# Patient Record
Sex: Male | Born: 1945 | Race: White | Hispanic: No | Smoking: Current every day smoker
Health system: Southern US, Community
[De-identification: ages and names within clinical notes are randomized; demographics above are authoritative.]

## PROBLEM LIST (undated history)

## (undated) DIAGNOSIS — K649 Unspecified hemorrhoids: Secondary | ICD-10-CM

## (undated) DIAGNOSIS — E119 Type 2 diabetes mellitus without complications: Secondary | ICD-10-CM

## (undated) DIAGNOSIS — M1990 Unspecified osteoarthritis, unspecified site: Secondary | ICD-10-CM

## (undated) DIAGNOSIS — G7000 Myasthenia gravis without (acute) exacerbation: Secondary | ICD-10-CM

## (undated) DIAGNOSIS — C189 Malignant neoplasm of colon, unspecified: Secondary | ICD-10-CM

## (undated) DIAGNOSIS — E538 Deficiency of other specified B group vitamins: Secondary | ICD-10-CM

## (undated) DIAGNOSIS — I10 Essential (primary) hypertension: Secondary | ICD-10-CM

## (undated) DIAGNOSIS — F419 Anxiety disorder, unspecified: Secondary | ICD-10-CM

## (undated) DIAGNOSIS — K219 Gastro-esophageal reflux disease without esophagitis: Secondary | ICD-10-CM

## (undated) DIAGNOSIS — D649 Anemia, unspecified: Secondary | ICD-10-CM

## (undated) DIAGNOSIS — G8929 Other chronic pain: Secondary | ICD-10-CM

## (undated) DIAGNOSIS — M545 Low back pain: Secondary | ICD-10-CM

## (undated) DIAGNOSIS — K635 Polyp of colon: Secondary | ICD-10-CM

## (undated) DIAGNOSIS — G5603 Carpal tunnel syndrome, bilateral upper limbs: Secondary | ICD-10-CM

## (undated) DIAGNOSIS — K5790 Diverticulosis of intestine, part unspecified, without perforation or abscess without bleeding: Secondary | ICD-10-CM

## (undated) DIAGNOSIS — E785 Hyperlipidemia, unspecified: Secondary | ICD-10-CM

## (undated) DIAGNOSIS — C4491 Basal cell carcinoma of skin, unspecified: Secondary | ICD-10-CM

## (undated) HISTORY — DX: Anemia, unspecified: D64.9

## (undated) HISTORY — PX: BASAL CELL CARCINOMA EXCISION: SHX1214

## (undated) HISTORY — DX: Unspecified osteoarthritis, unspecified site: M19.90

## (undated) HISTORY — PX: COLONOSCOPY: SHX174

## (undated) HISTORY — DX: Diverticulosis of intestine, part unspecified, without perforation or abscess without bleeding: K57.90

## (undated) HISTORY — DX: Low back pain: M54.5

## (undated) HISTORY — DX: Morbid (severe) obesity due to excess calories: E66.01

## (undated) HISTORY — DX: Essential (primary) hypertension: I10

## (undated) HISTORY — DX: Hyperlipidemia, unspecified: E78.5

## (undated) HISTORY — DX: Polyp of colon: K63.5

## (undated) HISTORY — DX: Anxiety disorder, unspecified: F41.9

## (undated) HISTORY — DX: Deficiency of other specified B group vitamins: E53.8

## (undated) HISTORY — DX: Basal cell carcinoma of skin, unspecified: C44.91

## (undated) HISTORY — DX: Myasthenia gravis without (acute) exacerbation: G70.00

## (undated) HISTORY — DX: Other chronic pain: G89.29

## (undated) HISTORY — DX: Malignant neoplasm of colon, unspecified: C18.9

## (undated) HISTORY — DX: Carpal tunnel syndrome, bilateral upper limbs: G56.03

## (undated) HISTORY — DX: Unspecified hemorrhoids: K64.9

## (undated) HISTORY — DX: Type 2 diabetes mellitus without complications: E11.9

---

## 1966-09-10 HISTORY — PX: TONSILLECTOMY: SUR1361

## 1967-09-11 HISTORY — PX: ANKLE ARTHROPLASTY: SUR68

## 2004-09-10 DIAGNOSIS — C189 Malignant neoplasm of colon, unspecified: Secondary | ICD-10-CM

## 2004-09-10 HISTORY — PX: RIGHT COLECTOMY: SHX853

## 2004-09-10 HISTORY — DX: Malignant neoplasm of colon, unspecified: C18.9

## 2004-09-25 ENCOUNTER — Ambulatory Visit: Payer: Self-pay | Admitting: Internal Medicine

## 2004-11-02 ENCOUNTER — Ambulatory Visit: Payer: Self-pay | Admitting: Internal Medicine

## 2004-12-12 ENCOUNTER — Encounter (INDEPENDENT_AMBULATORY_CARE_PROVIDER_SITE_OTHER): Payer: Self-pay | Admitting: Specialist

## 2004-12-12 ENCOUNTER — Inpatient Hospital Stay (HOSPITAL_COMMUNITY): Admission: RE | Admit: 2004-12-12 | Discharge: 2004-12-17 | Payer: Self-pay | Admitting: General Surgery

## 2004-12-12 ENCOUNTER — Encounter (INDEPENDENT_AMBULATORY_CARE_PROVIDER_SITE_OTHER): Payer: Self-pay | Admitting: *Deleted

## 2005-01-01 ENCOUNTER — Encounter (INDEPENDENT_AMBULATORY_CARE_PROVIDER_SITE_OTHER): Payer: Self-pay | Admitting: *Deleted

## 2005-12-26 ENCOUNTER — Ambulatory Visit: Payer: Self-pay | Admitting: Internal Medicine

## 2006-01-23 ENCOUNTER — Ambulatory Visit: Payer: Self-pay | Admitting: Internal Medicine

## 2006-03-07 ENCOUNTER — Encounter (INDEPENDENT_AMBULATORY_CARE_PROVIDER_SITE_OTHER): Payer: Self-pay | Admitting: *Deleted

## 2006-03-07 ENCOUNTER — Encounter: Admission: RE | Admit: 2006-03-07 | Discharge: 2006-03-07 | Payer: Self-pay | Admitting: General Surgery

## 2006-03-22 ENCOUNTER — Encounter: Admission: RE | Admit: 2006-03-22 | Discharge: 2006-03-22 | Payer: Self-pay | Admitting: General Surgery

## 2007-06-23 ENCOUNTER — Encounter (INDEPENDENT_AMBULATORY_CARE_PROVIDER_SITE_OTHER): Payer: Self-pay | Admitting: *Deleted

## 2007-06-23 ENCOUNTER — Encounter: Admission: RE | Admit: 2007-06-23 | Discharge: 2007-06-23 | Payer: Self-pay | Admitting: General Surgery

## 2007-10-18 ENCOUNTER — Encounter: Admission: RE | Admit: 2007-10-18 | Discharge: 2007-10-18 | Payer: Self-pay | Admitting: Internal Medicine

## 2009-02-21 DIAGNOSIS — IMO0002 Reserved for concepts with insufficient information to code with codable children: Secondary | ICD-10-CM | POA: Insufficient documentation

## 2009-02-21 DIAGNOSIS — K5730 Diverticulosis of large intestine without perforation or abscess without bleeding: Secondary | ICD-10-CM | POA: Insufficient documentation

## 2009-02-21 DIAGNOSIS — K649 Unspecified hemorrhoids: Secondary | ICD-10-CM | POA: Insufficient documentation

## 2009-02-21 DIAGNOSIS — E119 Type 2 diabetes mellitus without complications: Secondary | ICD-10-CM | POA: Insufficient documentation

## 2009-02-21 DIAGNOSIS — C189 Malignant neoplasm of colon, unspecified: Secondary | ICD-10-CM | POA: Insufficient documentation

## 2009-02-21 DIAGNOSIS — E785 Hyperlipidemia, unspecified: Secondary | ICD-10-CM | POA: Insufficient documentation

## 2009-02-22 ENCOUNTER — Ambulatory Visit: Payer: Self-pay | Admitting: Internal Medicine

## 2009-02-22 DIAGNOSIS — K648 Other hemorrhoids: Secondary | ICD-10-CM | POA: Insufficient documentation

## 2009-02-22 DIAGNOSIS — Z85038 Personal history of other malignant neoplasm of large intestine: Secondary | ICD-10-CM | POA: Insufficient documentation

## 2009-02-22 DIAGNOSIS — K625 Hemorrhage of anus and rectum: Secondary | ICD-10-CM | POA: Insufficient documentation

## 2009-04-18 ENCOUNTER — Ambulatory Visit: Payer: Self-pay | Admitting: Internal Medicine

## 2009-04-18 ENCOUNTER — Encounter: Payer: Self-pay | Admitting: Internal Medicine

## 2009-04-19 ENCOUNTER — Encounter: Payer: Self-pay | Admitting: Internal Medicine

## 2009-05-27 ENCOUNTER — Telehealth (INDEPENDENT_AMBULATORY_CARE_PROVIDER_SITE_OTHER): Payer: Self-pay | Admitting: *Deleted

## 2009-05-30 ENCOUNTER — Encounter: Payer: Self-pay | Admitting: Internal Medicine

## 2009-08-15 ENCOUNTER — Emergency Department (HOSPITAL_COMMUNITY): Admission: EM | Admit: 2009-08-15 | Discharge: 2009-08-15 | Payer: Self-pay | Admitting: Emergency Medicine

## 2009-08-19 ENCOUNTER — Inpatient Hospital Stay (HOSPITAL_COMMUNITY): Admission: AD | Admit: 2009-08-19 | Discharge: 2009-08-21 | Payer: Self-pay | Admitting: Orthopedic Surgery

## 2009-09-10 HISTORY — PX: PATELLA FRACTURE SURGERY: SHX735

## 2009-12-08 ENCOUNTER — Encounter: Payer: Self-pay | Admitting: Internal Medicine

## 2010-10-10 NOTE — Letter (Signed)
Summary: [facility]   Imported By: Sherian Rein 12/27/2009 10:20:24  _____________________________________________________________________  External Attachment:    Type:   Image     Comment:   External Document

## 2010-12-12 LAB — CBC
HCT: 32.6 % — ABNORMAL LOW (ref 39.0–52.0)
Hemoglobin: 11.1 g/dL — ABNORMAL LOW (ref 13.0–17.0)
Hemoglobin: 12.8 g/dL — ABNORMAL LOW (ref 13.0–17.0)
MCHC: 33.4 g/dL (ref 30.0–36.0)
MCHC: 34.1 g/dL (ref 30.0–36.0)
MCV: 91.8 fL (ref 78.0–100.0)
RBC: 3.6 MIL/uL — ABNORMAL LOW (ref 4.22–5.81)
RDW: 12.4 % (ref 11.5–15.5)
RDW: 12.7 % (ref 11.5–15.5)

## 2010-12-12 LAB — BASIC METABOLIC PANEL
CO2: 27 mEq/L (ref 19–32)
CO2: 27 mEq/L (ref 19–32)
Calcium: 8.4 mg/dL (ref 8.4–10.5)
GFR calc Af Amer: >60 mL/min (ref >60)
GFR calc non Af Amer: >60 mL/min (ref >60)
GFR calc non Af Amer: >60 mL/min (ref >60)
Glucose, Bld: 174 mg/dL — ABNORMAL HIGH (ref 70–99)
Glucose, Bld: 236 mg/dL — ABNORMAL HIGH (ref 70–99)
Potassium: 4.4 mEq/L (ref 3.5–5.1)
Potassium: 4.6 mEq/L (ref 3.5–5.1)
Sodium: 135 mEq/L (ref 135–145)
Sodium: 137 mEq/L (ref 135–145)

## 2010-12-12 LAB — URINALYSIS, ROUTINE W REFLEX MICROSCOPIC
Glucose, UA: NEGATIVE mg/dL
Hgb urine dipstick: NEGATIVE
Ketones, ur: NEGATIVE mg/dL
pH: 5.5 (ref 5.0–8.0)

## 2010-12-12 LAB — DIFFERENTIAL
Basophils Absolute: 0 10*3/uL (ref 0.0–0.1)
Eosinophils Relative: 1 % (ref 0–5)
Lymphocytes Relative: 21 % (ref 12–46)
Lymphs Abs: 2 10*3/uL (ref 0.7–4.0)
Monocytes Absolute: 0.6 10*3/uL (ref 0.1–1.0)
Neutro Abs: 7 10*3/uL (ref 1.7–7.7)

## 2010-12-12 LAB — GLUCOSE, CAPILLARY
Glucose-Capillary: 139 mg/dL — ABNORMAL HIGH (ref 70–99)
Glucose-Capillary: 157 mg/dL — ABNORMAL HIGH (ref 70–99)
Glucose-Capillary: 163 mg/dL — ABNORMAL HIGH (ref 70–99)
Glucose-Capillary: 164 mg/dL — ABNORMAL HIGH (ref 70–99)
Glucose-Capillary: 193 mg/dL — ABNORMAL HIGH (ref 70–99)
Glucose-Capillary: 79 mg/dL (ref 70–99)

## 2010-12-12 LAB — TYPE AND SCREEN: Antibody Screen: NEGATIVE

## 2010-12-12 LAB — PROTIME-INR: INR: 1.05 (ref 0.00–1.49)

## 2010-12-16 LAB — GLUCOSE, CAPILLARY: Glucose-Capillary: 147 mg/dL — ABNORMAL HIGH (ref 70–99)

## 2011-01-26 NOTE — Op Note (Signed)
NAME:  Joshua [name], Joshua [name]                 ACCOUNT NO.:  [id number]   MEDICAL RECORD NO.:  [id number]          PATIENT TYPE:  INP   LOCATION:  0011                         FACILITY:  [facility]   PHYSICIAN:  Adolph Pollack, M.D.DATE OF BIRTH:  1946-02-15   DATE OF PROCEDURE:  12/12/2004  DATE OF DISCHARGE:                                 OPERATIVE REPORT   PREOPERATIVE DIAGNOSIS:  Right colon cancer.   POSTOPERATIVE DIAGNOSIS:  Right colon cancer.   PROCEDURE:  Laparoscopic-assisted right colectomy.   SURGEON:  Adolph Pollack, M.D.   ASSISTANT:  Lorne Skeens. Hoxworth, M.D.   ANESTHESIA:  General.   ESTIMATED BLOOD LOSS:  About 600 cc.   INDICATION:  Mr. Hornback is a 65 year old male who underwent a screening  colonoscopy and was found to have a sessile polypoid lesion in the cecal  area that was biopsied and positive for adenocarcinoma.  CT scan is negative  for metastatic disease.  CEA is within normal limits.  He now presents for  the above procedure.   TECHNIQUE:  He was placed supine on the operating table and general  anesthetic was administered.  A Foley catheter was placed.  The hair on the  abdomen was shaved and the area was sterilely prepped and draped.  He is  morbidly obese, has a BMI of 42.  A small left upper quadrant incision was  made.  Using a Visiport with a 5-mm camera, I was able to gain access into  the peritoneal cavity and after doing so insufflated CO2 gas into the  peritoneal cavity.  I examined the area under the trocar and no trauma to  viscera was noted.  Following this, a 10-mm trocar was placed in the left  mid abdomen.  Another 5-mm trocar was placed in the left mid abdomen.  A 5-  mm trocar was placed in the lower midline just inferior to the umbilicus.  Left upper quadrant 5-mm trocar was then traded out for a 10/11.  I began by  mobilizing the right colon.  I had to use all long trocars.  Because of his  size, we were able to expose but it was  quite a bit slower.  The mesentery  was fairly fatty.  I incised the white line of Toldt on the right side  mobilizing the right colon staying anterior to the plane of the ureter.  I  did this using the harmonic scalpel.  The harmonic scalpel was then used to  free up the hepatic flexure.  Using careful blunt dissection and harmonic  scalpel and staying above the plane of the ureter, I was able to medialize  the right colon in the proximal portion of the transverse colon.  I  subsequently removed the subumbilical trocar and made a larger incision for  an extraction site through all layers.  A wound protection device was placed  and I placed my hand in and did some more hand-assisted dissection by way of  laparoscopy and using the harmonic scalpel.  I was then able to grasp the  cecum and extract the  cecum and distal ileum.  I divided the distal ileum  with the endo GIA stapler.  I subsequently identified the proximal  transverse colon and divided it with the GIA stapler.  The mesentery was  fairly fatty and the wound was very deep.  We then divided the mesenteric  vessels between clamps and ligated them, both with suture and with tie at  times.  This was the difficult portion of the case as his wound was so deep  because of his size and this took approximately twice as long as normal.  Once this had been done, I was then able to perform a side-to-side  anastomosis between the distal ileum and the transverse colon in a stapled  fashion.  The remaining enterotomy was closed with linear non-cutting  stapler and this area was oversewn with interrupted 2-0 silk sutures.  The  distal portion of the anastomosis was reinforced with single 3-0 silk  suture.  Anastomosis was patent, viable, and under no tension.  It dropped  back into the abdomen.  I then opened the specimen on the back table and  noted it contained a lesion and I marked the lesion with a suture.  I  changed gown and gloves at this  time.   I then irrigated out the abdominal cavity and evacuated the fluid with  suction.  About 2500 cc was used for irrigation.  About 600 cc blood loss  was noted.  Needle, sponge, and instrument counts were correct.  I then  closed the fascia of the extraction site in the lower midline with a running  #1 PDS suture.  The subcutaneous tissue was irrigated and the skin closed  with staples.  I re-insufflated the abdomen and noted irrigation fluid  around the peri-hepatic area and evacuated this and irrigated more.  Fluid  started coming back clear.  I noted no active bleeding and the fascial  closure was solid.  I then removed the remaining trocars and released the  pneumoperitoneum.  The skin incisions of the trocars were then closed with  staples.  Sterile dressings were applied.   He tolerated the procedure well without any apparent complications.  The  time of the operation was approximately 1 hour longer because of his size.  He subsequently was taken to the recovery room in satisfactory condition.      TJR/MEDQ  D:  12/12/2004  T:  12/12/2004  Job:  [id number]   cc:   Wilhemina Bonito. Marina Goodell, M.D. [facility]   Barry Dienes. Eloise Harman, M.D.  [address]  [place]  [place] [address]  Fax: [phone]

## 2011-01-26 NOTE — Discharge Summary (Signed)
NAMEGiovan Pinsky, Bell                 ACCOUNT NO.:  [id number]   MEDICAL RECORD NO.:  [id number]          PATIENT TYPE:  INP   LOCATION:  0444                         FACILITY:  [facility]   PHYSICIAN:  Adolph Pollack, M.D.DATE OF BIRTH:  1946-07-23   DATE OF ADMISSION:  12/12/2004  DATE OF DISCHARGE:  12/17/2004                                 DISCHARGE SUMMARY   PRIMARY DISCHARGE DIAGNOSIS:  Stage 1 adenocarcinoma of the right colon.   SECONDARY DIAGNOSES:  1.  Type 2 diabetes mellitus.  2.  Hypertension.  3.  Mild postoperative ileus.   PROCEDURE:  Laparoscopic-assisted right colectomy.   REASON FOR ADMISSION:  Mr. Rafferty is a 64 year old male who had a screening  colonoscopy and had a sessile polyp in the cecum that was biopsied and  positive for adenocarcinoma. CT scan was negative for metastatic disease. He  was admitted for elective partial colectomy.   HOSPITAL COURSE:  He underwent the above procedure and postoperatively was  on the Glucommander. Postoperative day #1 there was noted to be a  significant drop in the hemoglobin from 13 to 8.7 and his creatinine was up  to 2.1. There was concern about potential ureteral injury. A STAT renal  ultrasound was performed which was normal. I went ahead and repeated the  laboratory values and hemoglobin was only 10.5 and the creatinine was 1.0  This was consistent with laboratory error on the first test. I discussed  this with the patient and also discussed with administration. After that he  had a relatively unremarkable course. He was taken off the Glucommander,  switched to Humalog, diet was started. Pathology came back consistent with  stage 1 colon cancer. Blood pressure remained stable. He was passing gas by  postoperative day #4 and by postoperative day #5 was having bowel movements,  tolerating a diet, wound was clean and intact, and he was discharged.   DISPOSITION:  Discharged to home in satisfactory condition on December 17, 2004.  He will return in 4 days for staple removal. He was given specific activity  instructions and dietary instructions. Tylox for pain was given. He was told  to resume home medicines.      TJR/MEDQ  D:  01/01/2005  T:  01/01/2005  Job:  [id number]   cc:   Barry Dienes. Eloise Harman, M.D.  [address]  [place]  [place] [address]  Fax: [phone]   Wilhemina Bonito. Marina Goodell, M.D. [facility]

## 2011-04-17 ENCOUNTER — Other Ambulatory Visit: Payer: Self-pay | Admitting: Internal Medicine

## 2011-04-20 ENCOUNTER — Ambulatory Visit
Admission: RE | Admit: 2011-04-20 | Discharge: 2011-04-20 | Disposition: A | Discharge status: Home / Self Care | Payer: 59 | Source: Ambulatory Visit | Attending: Internal Medicine | Admitting: Internal Medicine

## 2011-05-16 ENCOUNTER — Encounter: Payer: Self-pay | Admitting: Internal Medicine

## 2011-05-17 ENCOUNTER — Other Ambulatory Visit: Payer: Self-pay | Admitting: Neurology

## 2011-05-17 DIAGNOSIS — E119 Type 2 diabetes mellitus without complications: Secondary | ICD-10-CM

## 2011-05-17 DIAGNOSIS — G7001 Myasthenia gravis with (acute) exacerbation: Secondary | ICD-10-CM

## 2011-05-19 ENCOUNTER — Inpatient Hospital Stay (HOSPITAL_COMMUNITY)
Admission: EM | Admit: 2011-05-19 | Discharge: 2011-06-01 | DRG: 056 | Disposition: A | Discharge status: Home Health | Payer: 59 | Attending: Internal Medicine | Admitting: Internal Medicine

## 2011-05-19 ENCOUNTER — Emergency Department (HOSPITAL_COMMUNITY): Payer: 59

## 2011-05-19 DIAGNOSIS — I10 Essential (primary) hypertension: Secondary | ICD-10-CM | POA: Diagnosis present

## 2011-05-19 DIAGNOSIS — J9600 Acute respiratory failure, unspecified whether with hypoxia or hypercapnia: Secondary | ICD-10-CM | POA: Diagnosis present

## 2011-05-19 DIAGNOSIS — F17200 Nicotine dependence, unspecified, uncomplicated: Secondary | ICD-10-CM | POA: Diagnosis present

## 2011-05-19 DIAGNOSIS — E119 Type 2 diabetes mellitus without complications: Secondary | ICD-10-CM | POA: Diagnosis present

## 2011-05-19 DIAGNOSIS — E785 Hyperlipidemia, unspecified: Secondary | ICD-10-CM | POA: Diagnosis present

## 2011-05-19 DIAGNOSIS — Z794 Long term (current) use of insulin: Secondary | ICD-10-CM

## 2011-05-19 DIAGNOSIS — E46 Unspecified protein-calorie malnutrition: Secondary | ICD-10-CM | POA: Diagnosis not present

## 2011-05-19 DIAGNOSIS — E876 Hypokalemia: Secondary | ICD-10-CM | POA: Diagnosis not present

## 2011-05-19 DIAGNOSIS — R1310 Dysphagia, unspecified: Secondary | ICD-10-CM | POA: Diagnosis present

## 2011-05-19 DIAGNOSIS — R197 Diarrhea, unspecified: Secondary | ICD-10-CM | POA: Diagnosis present

## 2011-05-19 DIAGNOSIS — IMO0002 Reserved for concepts with insufficient information to code with codable children: Secondary | ICD-10-CM

## 2011-05-19 DIAGNOSIS — G7001 Myasthenia gravis with (acute) exacerbation: Principal | ICD-10-CM | POA: Diagnosis present

## 2011-05-19 LAB — CBC
MCH: 29.8 pg (ref 26.0–34.0)
MCHC: 34.2 g/dL (ref 30.0–36.0)
MCV: 87.2 fL (ref 78.0–100.0)
Platelets: 298 10*3/uL (ref 150–400)
RBC: 4.76 MIL/uL (ref 4.22–5.81)
RDW: 12.5 % (ref 11.5–15.5)

## 2011-05-19 LAB — COMPREHENSIVE METABOLIC PANEL
Albumin: 3.8 g/dL (ref 3.5–5.2)
Alkaline Phosphatase: 87 U/L (ref 39–117)
BUN: 26 mg/dL — ABNORMAL HIGH (ref 6–23)
Creatinine, Ser: 1.17 mg/dL (ref 0.50–1.35)
GFR calc Af Amer: >60 mL/min (ref >60)
Glucose, Bld: 153 mg/dL — ABNORMAL HIGH (ref 70–99)
Potassium: 4.1 mEq/L (ref 3.5–5.1)
Total Protein: 6.8 g/dL (ref 6.0–8.3)

## 2011-05-19 LAB — DIFFERENTIAL
Basophils Relative: 0 % (ref 0–1)
Eosinophils Absolute: 0 10*3/uL (ref 0.0–0.7)
Eosinophils Relative: 0 % (ref 0–5)
Lymphs Abs: 2.1 10*3/uL (ref 0.7–4.0)
Monocytes Absolute: 0.9 10*3/uL (ref 0.1–1.0)
Monocytes Relative: 9 % (ref 3–12)
Neutrophils Relative %: 71 % (ref 43–77)

## 2011-05-19 LAB — URINALYSIS, ROUTINE W REFLEX MICROSCOPIC
Ketones, ur: 40 mg/dL — AB
Leukocytes, UA: NEGATIVE
Nitrite: NEGATIVE
Protein, ur: NEGATIVE mg/dL
pH: 5.5 (ref 5.0–8.0)

## 2011-05-19 LAB — PROTIME-INR: Prothrombin Time: 13.3 seconds (ref 11.6–15.2)

## 2011-05-20 ENCOUNTER — Emergency Department (HOSPITAL_COMMUNITY): Payer: 59

## 2011-05-20 ENCOUNTER — Inpatient Hospital Stay (HOSPITAL_COMMUNITY): Payer: 59

## 2011-05-20 LAB — CBC
Hemoglobin: 12.9 g/dL — ABNORMAL LOW (ref 13.0–17.0)
MCHC: 33.3 g/dL (ref 30.0–36.0)
RDW: 12.6 % (ref 11.5–15.5)
WBC: 9.1 10*3/uL (ref 4.0–10.5)

## 2011-05-20 LAB — BASIC METABOLIC PANEL
Chloride: 103 mEq/L (ref 96–112)
GFR calc Af Amer: >60 mL/min (ref >60)
GFR calc non Af Amer: >60 mL/min (ref >60)
Potassium: 4.4 mEq/L (ref 3.5–5.1)
Sodium: 137 mEq/L (ref 135–145)

## 2011-05-20 LAB — MRSA PCR SCREENING: MRSA by PCR: NEGATIVE

## 2011-05-20 LAB — GLUCOSE, CAPILLARY
Glucose-Capillary: 186 mg/dL — ABNORMAL HIGH (ref 70–99)
Glucose-Capillary: 153 mg/dL — ABNORMAL HIGH (ref 70–99)
Glucose-Capillary: 106 mg/dL — ABNORMAL HIGH (ref 70–99)

## 2011-05-20 LAB — HEMOGLOBIN A1C
Hgb A1c MFr Bld: 7.5 % — ABNORMAL HIGH (ref <5.7)
Mean Plasma Glucose: 169 mg/dL — ABNORMAL HIGH (ref <117)

## 2011-05-20 MED ORDER — IOHEXOL 300 MG/ML  SOLN
100.0000 mL | Freq: Once | INTRAMUSCULAR | Status: AC | PRN
  Administered 2011-05-20: 100 mL via INTRAVENOUS

## 2011-05-20 NOTE — Consult Note (Addendum)
[name], [name] NO.:  [id number]  MEDICAL RECORD NO.:  [id number]  LOCATION:  1240                         FACILITY:  [facility]  PHYSICIAN:  Beryle Beams, MD   DATE OF BIRTH:  Jun 08, 1946  DATE OF CONSULTATION:  05/20/2011 DATE OF DISCHARGE:                                CONSULTATION   HISTORY:  Joshua [name] is a 65 year old white male with recently diagnosed myasthenia gravis (by Dr. Sandria Manly of Neurology) who is undergoing evaluation for difficulties with dysphagia and neck weakness.  History is according to the patient and his wife who are historians as well as reviewed the hospital chart and conversation with Dr. Manus Gunning.  Joshua [name] indicates that he has had difficulties with some dysarthria and dysphasia for the last approximately 3 months and had been recently diagnosed with myasthenia gravis by Dr. Sandria Manly of Neurology.  It sounds as if he may have had blood work (? acetylcholine receptor antibody level) as well as some type of electrodiagnostic testing to arrive at the diagnosis.  He had been apparently started on some Mestinon a few weeks ago, but then apparently stopped taking this last night as well as today due to difficulties with diarrhea.  He comes in today after a progressive decline with difficulty swallowing, neck weakness, and dysarthria.  This apparently has been a progressive problem for the last few weeks, but became much worse today.  In the emergency room, he has had further testing including chest x-ray which showed some mild enlargement of his heart, but no effusions and no evidence of any type of active disease.  He also had some lab work performed showing white count of 10.6 and unremarkable chemistry-7 with exception glucose 153. Neurology is not consulted for further evaluation.  PAST MEDICAL HISTORY:  Significant for, 1. History of chronic back pain. 2. Colon cancer. 3. Diabetes. 4. High cholesterol. 5. Hypertension. 6.  myasthenia gravis as noted above.  PAST SURGICAL HISTORY:  None noted.  MEDICATIONS:  Include Lipitor, Azor, hydrochlorothiazide, metformin, Ultram, hyoscyamine, as well as Humalog.  He also has been started on pyridostigmine bromide 30 mg which he states he takes three times a day half an hour before meals.  ALLERGIES:  No known drug allergies.  SOCIAL HISTORY:  He resides in the [place] area.  I do not believe that he smokes or uses alcohol.  FAMILY HISTORY:  Unknown.  REVIEW OF SYSTEMS:  NEUROLOGICAL SYSTEMS:  As noted above.  PHYSICAL EXAMINATION:  GENERAL:  Reveals a pleasant, cooperative, late middle-aged white male who is in no acute distress. VITAL SIGNS:  He is currently afebrile.  Vital signs are stable.  His blood pressure is 124/72, pulse 99, respiratory rate 20. NEUROLOGIC:  He is currently alert and he is oriented x3.  He has fluent, but significantly dysarthric speech.  His dysarthria appears to be more guttural.  He can name, repeat, and follow commands.  He has bilateral ptosis.  His extraocular see appear to be intact without any obvious gaze restriction.  There is no noticeable diplopia with sustained up gaze.  Facial musculature is weak and he has difficulties puffing his cheeks.  His tongue protrudes  midline without any obvious atrophy or fasciculations.  Otherwise, cranial nerves II through XII appear to be intact.  Motor examination, he has notable neck flexion weakness, a grade 4/5, but good neck extension with the strength of 5/5. He does appear to have fatigable proximal deltoid weakness, but strength is roughly at least 4 to 5 over 5.  Remainder of motor strength in the arms as well as in the legs appears to be 5/5.  Sensory exam is symmetric pinprick and vibration.  Cerebellar exam on finger-to-nose, there is no evidence of pronator drift.  Deep tendon reflexes were 1+ with toes downgoing. CARDIOVASCULAR:  Regular rate and rhythm without murmur or  gallop.  No carotid bruits.  No evidence of cyanosis, clubbing, or edema. ABDOMEN:  Soft and nontender with normoactive bowel sounds. LUNGS:  Clear auscultation bilaterally. SKIN:  Reveals no obvious cuts, abrasions, or bruises.  LABORATORY VALUES:  Include a unremarkable urinalysis.  His chemistry-7 is unremarkable with a normal CBC.  Chest x-ray is unremarkable as noted above.  ASSESSMENT:  Myasthenia gravis (ocular bulbar) with recent worsening.  DISCUSSION:  Joshua [name] presents with a recently diagnosed ocular bulbar myasthenia gravis.  He has had some recent worsening in regard to his dysarthria as well as difficulties with dysphagia.  This appears to have coincided with cessation of his Mestinon due to his difficulties with diarrhea.  On exam at this time, he appears to have mostly bulbar weakness as well as neck flexion weakness, but good extremity weakness and no obvious respiratory distress.  FVC has been checked which is noted to be less than 2 L.  PLAN:  At this time, I would suggest restarting his Mestinon 60 mg one- half tablet t.i.d. and I agree with using Lomotil for his diarrhea. Obviously, we will have to begin his medicines via the NG tube.  If the Lomotil is not helpful, I will consider try Robinul for the diarrhea. He will be started on IVIG (0.4 g/kg per day x5 days) to help with his acute worsening.  If this is not helpful, then we can consider IV Solu- Medrol with transition to prednisone, but he would need to be monitored closely as this can typically cause transient worsening after a few days.  Chest CT will be ordered to evaluate for thymoma and also he will need to be seen by speech, physical, and occupational therapy.  I will continue frequent neuro checks with an FVC as well as NIF on a daily basis.  Finally, he will need a sleep study as an outpatient as he is obese with increased neck size, daytime sleepiness, and snoring and is even more prone to sleep  apnea due to his myasthenia.  Going forward in general, we should try to avoid the use of any type of anticholinergics, certain cardiovascular, and antibiotic medications which may make his myasthenia worse.  None of his admission medications were necessarily in this category.         ______________________________ Beryle Beams, MD    RY/MEDQ  D:  05/20/2011  T:  05/20/2011  Job:  [id number]  Electronically Signed by Beryle Beams MD on 06/30/2011 08:57:48 PM

## 2011-05-21 ENCOUNTER — Other Ambulatory Visit: Payer: 59

## 2011-05-21 LAB — CBC
HCT: 34.4 % — ABNORMAL LOW (ref 39.0–52.0)
MCHC: 32.6 g/dL (ref 30.0–36.0)
MCV: 90.3 fL (ref 78.0–100.0)
Platelets: 244 10*3/uL (ref 150–400)
RDW: 12.8 % (ref 11.5–15.5)

## 2011-05-21 LAB — GLUCOSE, CAPILLARY
Glucose-Capillary: 111 mg/dL — ABNORMAL HIGH (ref 70–99)
Glucose-Capillary: 141 mg/dL — ABNORMAL HIGH (ref 70–99)
Glucose-Capillary: 85 mg/dL (ref 70–99)

## 2011-05-21 LAB — DIFFERENTIAL
Basophils Absolute: 0 10*3/uL (ref 0.0–0.1)
Eosinophils Absolute: 0 10*3/uL (ref 0.0–0.7)
Eosinophils Relative: 0 % (ref 0–5)
Lymphocytes Relative: 15 % (ref 12–46)
Lymphs Abs: 1.5 10*3/uL (ref 0.7–4.0)
Monocytes Absolute: 1.1 10*3/uL — ABNORMAL HIGH (ref 0.1–1.0)

## 2011-05-21 LAB — MAGNESIUM: Magnesium: 1.6 mg/dL (ref 1.5–2.5)

## 2011-05-21 LAB — BASIC METABOLIC PANEL
BUN: 16 mg/dL (ref 6–23)
Calcium: 8.5 mg/dL (ref 8.4–10.5)
Creatinine, Ser: 0.99 mg/dL (ref 0.50–1.35)
GFR calc Af Amer: >60 mL/min (ref >60)
GFR calc non Af Amer: >60 mL/min (ref >60)

## 2011-05-22 ENCOUNTER — Inpatient Hospital Stay (HOSPITAL_COMMUNITY): Payer: 59

## 2011-05-22 ENCOUNTER — Encounter (HOSPITAL_COMMUNITY): Payer: Self-pay

## 2011-05-22 ENCOUNTER — Other Ambulatory Visit: Payer: 59

## 2011-05-22 DIAGNOSIS — E119 Type 2 diabetes mellitus without complications: Secondary | ICD-10-CM

## 2011-05-22 DIAGNOSIS — I10 Essential (primary) hypertension: Secondary | ICD-10-CM

## 2011-05-22 DIAGNOSIS — G7001 Myasthenia gravis with (acute) exacerbation: Secondary | ICD-10-CM

## 2011-05-22 DIAGNOSIS — J9600 Acute respiratory failure, unspecified whether with hypoxia or hypercapnia: Secondary | ICD-10-CM

## 2011-05-22 LAB — BASIC METABOLIC PANEL
CO2: 25 mEq/L (ref 19–32)
Chloride: 107 mEq/L (ref 96–112)
GFR calc non Af Amer: >60 mL/min (ref >60)
Glucose, Bld: 170 mg/dL — ABNORMAL HIGH (ref 70–99)
Potassium: 3.9 mEq/L (ref 3.5–5.1)
Sodium: 138 mEq/L (ref 135–145)

## 2011-05-22 LAB — GLUCOSE, CAPILLARY
Glucose-Capillary: 152 mg/dL — ABNORMAL HIGH (ref 70–99)
Glucose-Capillary: 175 mg/dL — ABNORMAL HIGH (ref 70–99)

## 2011-05-22 LAB — BLOOD GAS, ARTERIAL
Acid-base deficit: 6.3 mmol/L — ABNORMAL HIGH (ref 0.0–2.0)
Drawn by: 336861
FIO2: 0.5 %
O2 Saturation: 97.1 %
Patient temperature: 98.6
RATE: 16 resp/min
pO2, Arterial: 98 mmHg (ref 80.0–100.0)

## 2011-05-22 LAB — CBC
HCT: 32.8 % — ABNORMAL LOW (ref 39.0–52.0)
Hemoglobin: 10.9 g/dL — ABNORMAL LOW (ref 13.0–17.0)
MCHC: 33.2 g/dL (ref 30.0–36.0)
RBC: 3.64 MIL/uL — ABNORMAL LOW (ref 4.22–5.81)
WBC: 9.6 10*3/uL (ref 4.0–10.5)

## 2011-05-23 ENCOUNTER — Inpatient Hospital Stay (HOSPITAL_COMMUNITY): Payer: 59

## 2011-05-23 LAB — BLOOD GAS, ARTERIAL
Bicarbonate: 23.6 mEq/L (ref 20.0–24.0)
Drawn by: 232811
O2 Saturation: 98 %
PEEP: 5 cmH2O
Patient temperature: 98.6
RATE: 18 resp/min

## 2011-05-23 LAB — CBC
MCH: 29.4 pg (ref 26.0–34.0)
MCHC: 32.8 g/dL (ref 30.0–36.0)
Platelets: 242 10*3/uL (ref 150–400)

## 2011-05-23 LAB — URINE CULTURE
Colony Count: NO GROWTH
Culture: NO GROWTH
Special Requests: NEGATIVE

## 2011-05-23 LAB — GLUCOSE, CAPILLARY
Glucose-Capillary: 152 mg/dL — ABNORMAL HIGH (ref 70–99)
Glucose-Capillary: 216 mg/dL — ABNORMAL HIGH (ref 70–99)
Glucose-Capillary: 210 mg/dL — ABNORMAL HIGH (ref 70–99)

## 2011-05-23 LAB — BASIC METABOLIC PANEL
Calcium: 8.4 mg/dL (ref 8.4–10.5)
GFR calc Af Amer: >60 mL/min (ref >60)
GFR calc non Af Amer: >60 mL/min (ref >60)
Sodium: 137 mEq/L (ref 135–145)

## 2011-05-24 ENCOUNTER — Inpatient Hospital Stay (HOSPITAL_COMMUNITY): Payer: 59

## 2011-05-24 DIAGNOSIS — R0902 Hypoxemia: Secondary | ICD-10-CM

## 2011-05-24 LAB — BASIC METABOLIC PANEL
BUN: 25 mg/dL — ABNORMAL HIGH (ref 6–23)
CO2: 25 mEq/L (ref 19–32)
CO2: 24 mEq/L (ref 19–32)
Calcium: 8.2 mg/dL — ABNORMAL LOW (ref 8.4–10.5)
Calcium: 8.7 mg/dL (ref 8.4–10.5)
Calcium: 8.8 mg/dL (ref 8.4–10.5)
Chloride: 106 mEq/L (ref 96–112)
Creatinine, Ser: 0.91 mg/dL (ref 0.50–1.35)
Creatinine, Ser: 0.87 mg/dL (ref 0.50–1.35)
GFR calc non Af Amer: >60 mL/min (ref >60)
GFR calc non Af Amer: >60 mL/min (ref >60)
Glucose, Bld: 193 mg/dL — ABNORMAL HIGH (ref 70–99)
Glucose, Bld: 206 mg/dL — ABNORMAL HIGH (ref 70–99)
Sodium: 142 mEq/L (ref 135–145)

## 2011-05-24 LAB — CBC
Hemoglobin: 10.2 g/dL — ABNORMAL LOW (ref 13.0–17.0)
MCH: 29.3 pg (ref 26.0–34.0)
MCV: 89.1 fL (ref 78.0–100.0)
RBC: 3.48 MIL/uL — ABNORMAL LOW (ref 4.22–5.81)

## 2011-05-24 LAB — BLOOD GAS, ARTERIAL
Acid-Base Excess: 2.7 mmol/L — ABNORMAL HIGH (ref 0.0–2.0)
Bicarbonate: 25.6 mEq/L — ABNORMAL HIGH (ref 20.0–24.0)
Bicarbonate: 25.6 mEq/L — ABNORMAL HIGH (ref 20.0–24.0)
FIO2: 0.3 %
O2 Saturation: 97 %
Patient temperature: 98.1
RATE: 18 resp/min
TCO2: 22.9 mmol/L (ref 0–100)
TCO2: 23.7 mmol/L (ref 0–100)
pH, Arterial: 7.31 — ABNORMAL LOW (ref 7.350–7.450)
pO2, Arterial: 79.8 mmHg — ABNORMAL LOW (ref 80.0–100.0)

## 2011-05-24 LAB — PHOSPHORUS: Phosphorus: 2.7 mg/dL (ref 2.3–4.6)

## 2011-05-24 LAB — GLUCOSE, CAPILLARY
Glucose-Capillary: 201 mg/dL — ABNORMAL HIGH (ref 70–99)
Glucose-Capillary: 186 mg/dL — ABNORMAL HIGH (ref 70–99)
Glucose-Capillary: 217 mg/dL — ABNORMAL HIGH (ref 70–99)

## 2011-05-25 ENCOUNTER — Inpatient Hospital Stay (HOSPITAL_COMMUNITY): Payer: 59

## 2011-05-25 LAB — GLUCOSE, CAPILLARY
Glucose-Capillary: 190 mg/dL — ABNORMAL HIGH (ref 70–99)
Glucose-Capillary: 204 mg/dL — ABNORMAL HIGH (ref 70–99)

## 2011-05-25 LAB — BASIC METABOLIC PANEL
BUN: 31 mg/dL — ABNORMAL HIGH (ref 6–23)
Calcium: 8.4 mg/dL (ref 8.4–10.5)
Creatinine, Ser: 0.87 mg/dL (ref 0.50–1.35)
GFR calc Af Amer: >60 mL/min (ref >60)
GFR calc non Af Amer: >60 mL/min (ref >60)
Potassium: 3.5 mEq/L (ref 3.5–5.1)

## 2011-05-25 LAB — CULTURE, BAL-QUANTITATIVE W GRAM STAIN

## 2011-05-26 ENCOUNTER — Inpatient Hospital Stay (HOSPITAL_COMMUNITY): Payer: 59

## 2011-05-26 DIAGNOSIS — G7001 Myasthenia gravis with (acute) exacerbation: Secondary | ICD-10-CM

## 2011-05-26 DIAGNOSIS — J9600 Acute respiratory failure, unspecified whether with hypoxia or hypercapnia: Secondary | ICD-10-CM

## 2011-05-26 DIAGNOSIS — E119 Type 2 diabetes mellitus without complications: Secondary | ICD-10-CM

## 2011-05-26 LAB — GLUCOSE, CAPILLARY
Glucose-Capillary: 205 mg/dL — ABNORMAL HIGH (ref 70–99)
Glucose-Capillary: 195 mg/dL — ABNORMAL HIGH (ref 70–99)
Glucose-Capillary: 271 mg/dL — ABNORMAL HIGH (ref 70–99)

## 2011-05-26 LAB — BASIC METABOLIC PANEL
Chloride: 110 mEq/L (ref 96–112)
Creatinine, Ser: 0.91 mg/dL (ref 0.50–1.35)
GFR calc Af Amer: >60 mL/min (ref >60)
GFR calc non Af Amer: >60 mL/min (ref >60)
Potassium: 3.7 mEq/L (ref 3.5–5.1)

## 2011-05-26 LAB — MAGNESIUM: Magnesium: 2.1 mg/dL (ref 1.5–2.5)

## 2011-05-26 LAB — CBC
HCT: 32.1 % — ABNORMAL LOW (ref 39.0–52.0)
Hemoglobin: 10.2 g/dL — ABNORMAL LOW (ref 13.0–17.0)
MCH: 28.8 pg (ref 26.0–34.0)
MCHC: 31.8 g/dL (ref 30.0–36.0)

## 2011-05-26 LAB — PHOSPHORUS: Phosphorus: 4 mg/dL (ref 2.3–4.6)

## 2011-05-27 DIAGNOSIS — R0902 Hypoxemia: Secondary | ICD-10-CM

## 2011-05-27 DIAGNOSIS — J9600 Acute respiratory failure, unspecified whether with hypoxia or hypercapnia: Secondary | ICD-10-CM

## 2011-05-27 DIAGNOSIS — E119 Type 2 diabetes mellitus without complications: Secondary | ICD-10-CM

## 2011-05-27 DIAGNOSIS — G7001 Myasthenia gravis with (acute) exacerbation: Secondary | ICD-10-CM

## 2011-05-27 LAB — GLUCOSE, CAPILLARY
Glucose-Capillary: 175 mg/dL — ABNORMAL HIGH (ref 70–99)
Glucose-Capillary: 199 mg/dL — ABNORMAL HIGH (ref 70–99)
Glucose-Capillary: 190 mg/dL — ABNORMAL HIGH (ref 70–99)

## 2011-05-27 LAB — BASIC METABOLIC PANEL
CO2: 28 mEq/L (ref 19–32)
Chloride: 112 mEq/L (ref 96–112)
GFR calc non Af Amer: >60 mL/min (ref >60)
Glucose, Bld: 179 mg/dL — ABNORMAL HIGH (ref 70–99)
Potassium: 3.6 mEq/L (ref 3.5–5.1)
Sodium: 146 mEq/L — ABNORMAL HIGH (ref 135–145)

## 2011-05-27 LAB — PHOSPHORUS: Phosphorus: 4.1 mg/dL (ref 2.3–4.6)

## 2011-05-27 NOTE — H&P (Signed)
[name], [name]                 ACCOUNT NO.:  [id number]  MEDICAL RECORD NO.:  [id number]  LOCATION:  WLED                         FACILITY:  [facility]  PHYSICIAN:  Della Goo, M.D. DATE OF BIRTH:  08/10/1946  DATE OF ADMISSION:  05/19/2011 DATE OF DISCHARGE:                             HISTORY & PHYSICAL   DATE OF ADMISSION:  May 19, 2011.  PRIMARY CARE PHYSICIAN:  Dr. Clelia Croft.  NEUROLOGIST:  Genene Churn. Love, M.D.  CHIEF COMPLAINT:  Difficulty swallowing.  HISTORY OF PRESENT ILLNESS:  This is a 65 year old male with a history of myasthenia gravis, who presents to the emergency department with complaints of worsening dysphagia over the day.  He has been unable to swallow his foods, or liquids, or secretions.  He denies having any fevers or chills.  He also has been having bouts of diarrhea for the past few days.  He was not able to take his medications last night or today, which may be the cause of the worsening problem with his swallowing and flare-up of the myasthenia gravis.  He contacted the on- call physician, who recommended that he present to the emergency department for admission and treatment.  Neurology was consulted and advised medication administration.  The patient in the emergency department also underwent an evaluation of his FVC and was found to have a suboptimal tidal volume.  The patient was referred for medical admission to the [facility] hospitalist.  PAST MEDICAL HISTORY: 1. Myasthenia gravis. 2. Type 2 diabetes mellitus. 3. Hypertension. 4. Hyperlipidemia.  PAST SURGICAL HISTORY:  History of an open reduction and internal fixation of a left patellar fracture.  MEDICATIONS:  At this time include: 1. Lipitor. 2. Azor. 3. Hydrochlorothiazide. 4. Metformin. 5. Tramadol. 6. Pyridostigmine bromide. 7. Hyoscyamine sulfate. 8. Humalog insulin 75/25, 45 units subcu b.i.d.  ALLERGIES:  No known drug allergies.  SOCIAL HISTORY:  The patient is  married.  He is a smoker, but smokes only a small amount of cigarettes, 2 cigarettes daily.  He has been cutting down.  He denies any alcohol usage or illicit drug usage.  FAMILY HISTORY:  Noncontributory.  REVIEW OF SYSTEMS:  Pertinents mentioned above.  PHYSICAL EXAMINATION FINDINGS:  GENERAL:  This is a 65 year old morbidly obese Caucasian male, who is in discomfort, but no acute distress currently. VITAL SIGNS:  Temperature 97.7, blood pressure 124/72, heart rate 116, respirations 19, O2 sats 95%. HEENT:  Normocephalic, atraumatic.  Pupils are equally round, but are asymmetrically reactive to light.  There is mild lid lag of both eyes. Funduscopic is benign.  Nares are patent bilaterally.  Oropharynx is clear.  There is no airway compromise at this time. NECK:  Supple, full range of motion.  No thyromegaly, adenopathy, or jugular venous distention. CARDIOVASCULAR:  Regular rate and rhythm.  Normal S1-S2, no murmurs, gallops, or rubs appreciated. LUNGS:  Clear to auscultation bilaterally.  No rales, rhonchi, or wheezes. ABDOMEN:  Positive bowel sounds, soft, nontender, and nondistended.  No hepatosplenomegaly.  Obese. EXTREMITIES: Without cyanosis, clubbing, or edema. NEUROLOGIC:  Generalized weakness, but no focal deficits on examination and of note the patient walks with a cane for assistance.  LABORATORY STUDIES:  White  blood cell count 10.6, hemoglobin 14.2, hematocrit 41.5, MCV 87.2, platelets 296.  Neutrophils 71%, lymphocytes 20%.  Sodium 138, potassium 4.1, chloride 100, carbon dioxide 26, BUN 26, creatinine 1.17, and glucose of 153.  Chest x-ray reveals no acute disease process.  EKG reveals a sinus tachycardia.  No acute ST-segment changes were seen, however.  X-rays of the neck also is  negative for any acute findings or fractures.  ASSESSMENT:  42 four year old male being admitted with: 1. Acute flare-up of myasthenia gravis most likely secondary to      missing several doses of his medications. 2. Dysphagia secondary to #1. 3. Type II diabetes mellitus. 4. Hypertension. 5. Hyperlipidemia. 6. Diarrhea. 7. Morbid obesity.  PLAN:  The patient will be admitted to the step-down ICU area for monitoring in the event he has respiratory compromise.  Therapy will be initiated per the recommendations of Neurology, an NG-tube has been placed, and the patient will begin Mestinon per the NG tube t.i.d.  This will be given in the elixir form, also IV gamma globulin will be administered per the protocol for the next 5 days and Neurology has been consulted to see the patient.  The patient will be placed in SCD's for deep vein thrombosis prophylaxis and code status is a full code at this time and sliding scale insulin coverage has also been ordered at this time.  A reduced dosage of his insulin 70/30 will be ordered and the patient will be placed on Lomotil for his diarrhea.  The neurologist has mentioned that the diarrhea is most likely secondary to a parasympathetic response.  However, stool studies will be sent for culture sensitivity and stool for C. difficile PCR and patient will also be placed on precautions as well.  IV fluids have been ordered for rehydration and patient is n.p.o. for now. Further workup will ensue pending results of the patient's clinical course.     Della Goo, M.D.     HJ/MEDQ  D:  05/20/2011  T:  05/20/2011  Job:  [id number]  cc:   Genene Churn. Love, M.D. Fax: [phone]  Electronically Signed by Della Goo M.D. on 05/27/2011 09:58:28 PM

## 2011-05-28 ENCOUNTER — Inpatient Hospital Stay (HOSPITAL_COMMUNITY): Payer: 59

## 2011-05-28 LAB — BASIC METABOLIC PANEL
BUN: 33 mg/dL — ABNORMAL HIGH (ref 6–23)
Chloride: 110 mEq/L (ref 96–112)
Creatinine, Ser: 0.88 mg/dL (ref 0.50–1.35)
GFR calc Af Amer: >60 mL/min (ref >60)
GFR calc non Af Amer: >60 mL/min (ref >60)
Potassium: 3.5 mEq/L (ref 3.5–5.1)
Sodium: 146 mEq/L — ABNORMAL HIGH (ref 135–145)

## 2011-05-28 LAB — GLUCOSE, CAPILLARY
Glucose-Capillary: 129 mg/dL — ABNORMAL HIGH (ref 70–99)
Glucose-Capillary: 162 mg/dL — ABNORMAL HIGH (ref 70–99)
Glucose-Capillary: 166 mg/dL — ABNORMAL HIGH (ref 70–99)

## 2011-05-28 LAB — CBC
Hemoglobin: 10.5 g/dL — ABNORMAL LOW (ref 13.0–17.0)
MCH: 29.1 pg (ref 26.0–34.0)
RBC: 3.61 MIL/uL — ABNORMAL LOW (ref 4.22–5.81)

## 2011-05-29 ENCOUNTER — Inpatient Hospital Stay (HOSPITAL_COMMUNITY): Payer: 59

## 2011-05-29 DIAGNOSIS — G7001 Myasthenia gravis with (acute) exacerbation: Secondary | ICD-10-CM

## 2011-05-29 DIAGNOSIS — E119 Type 2 diabetes mellitus without complications: Secondary | ICD-10-CM

## 2011-05-29 DIAGNOSIS — R0902 Hypoxemia: Secondary | ICD-10-CM

## 2011-05-29 LAB — GLUCOSE, CAPILLARY
Glucose-Capillary: 143 mg/dL — ABNORMAL HIGH (ref 70–99)
Glucose-Capillary: 157 mg/dL — ABNORMAL HIGH (ref 70–99)
Glucose-Capillary: 213 mg/dL — ABNORMAL HIGH (ref 70–99)
Glucose-Capillary: 167 mg/dL — ABNORMAL HIGH (ref 70–99)

## 2011-05-29 LAB — CULTURE, BLOOD (ROUTINE X 2)
Culture  Setup Time: 201209120207
Culture: NO GROWTH

## 2011-05-29 LAB — CARDIAC PANEL(CRET KIN+CKTOT+MB+TROPI)
CK, MB: 4 ng/mL (ref 0.3–4.0)
Relative Index: 2.6 — ABNORMAL HIGH (ref 0.0–2.5)
Relative Index: INVALID (ref 0.0–2.5)
Total CK: 136 U/L (ref 7–232)
Troponin I: <0.3 ng/mL (ref <0.30)

## 2011-05-29 LAB — BASIC METABOLIC PANEL
BUN: 27 mg/dL — ABNORMAL HIGH (ref 6–23)
CO2: 31 mEq/L (ref 19–32)
CO2: 29 mEq/L (ref 19–32)
Calcium: 8.8 mg/dL (ref 8.4–10.5)
Chloride: 106 mEq/L (ref 96–112)
Creatinine, Ser: 0.83 mg/dL (ref 0.50–1.35)
Glucose, Bld: 157 mg/dL — ABNORMAL HIGH (ref 70–99)
Glucose, Bld: 176 mg/dL — ABNORMAL HIGH (ref 70–99)
Potassium: 3.5 mEq/L (ref 3.5–5.1)
Sodium: 141 mEq/L (ref 135–145)

## 2011-05-29 LAB — CBC
HCT: 33.6 % — ABNORMAL LOW (ref 39.0–52.0)
Hemoglobin: 10.9 g/dL — ABNORMAL LOW (ref 13.0–17.0)
MCH: 29.3 pg (ref 26.0–34.0)
MCHC: 32.4 g/dL (ref 30.0–36.0)

## 2011-05-29 LAB — MAGNESIUM: Magnesium: 2.1 mg/dL (ref 1.5–2.5)

## 2011-05-30 ENCOUNTER — Inpatient Hospital Stay (HOSPITAL_COMMUNITY): Payer: 59

## 2011-05-30 LAB — GLUCOSE, CAPILLARY
Glucose-Capillary: 184 mg/dL — ABNORMAL HIGH (ref 70–99)
Glucose-Capillary: 167 mg/dL — ABNORMAL HIGH (ref 70–99)
Glucose-Capillary: 291 mg/dL — ABNORMAL HIGH (ref 70–99)

## 2011-05-30 LAB — BASIC METABOLIC PANEL
BUN: 23 mg/dL (ref 6–23)
Chloride: 106 mEq/L (ref 96–112)
Creatinine, Ser: 0.77 mg/dL (ref 0.50–1.35)
GFR calc Af Amer: >60 mL/min (ref >60)

## 2011-05-30 LAB — CBC
HCT: 33.5 % — ABNORMAL LOW (ref 39.0–52.0)
MCHC: 32.5 g/dL (ref 30.0–36.0)
MCV: 90.8 fL (ref 78.0–100.0)
RDW: 13.1 % (ref 11.5–15.5)
WBC: 12.4 10*3/uL — ABNORMAL HIGH (ref 4.0–10.5)

## 2011-05-30 LAB — VITAMIN B12: Vitamin B-12: 609 pg/mL (ref 211–911)

## 2011-05-31 ENCOUNTER — Inpatient Hospital Stay (HOSPITAL_COMMUNITY): Payer: 59

## 2011-05-31 DIAGNOSIS — E119 Type 2 diabetes mellitus without complications: Secondary | ICD-10-CM

## 2011-05-31 DIAGNOSIS — R0902 Hypoxemia: Secondary | ICD-10-CM

## 2011-05-31 DIAGNOSIS — G7001 Myasthenia gravis with (acute) exacerbation: Secondary | ICD-10-CM

## 2011-05-31 LAB — GLUCOSE, CAPILLARY
Glucose-Capillary: 195 mg/dL — ABNORMAL HIGH (ref 70–99)
Glucose-Capillary: 291 mg/dL — ABNORMAL HIGH (ref 70–99)
Glucose-Capillary: 283 mg/dL — ABNORMAL HIGH (ref 70–99)
Glucose-Capillary: 197 mg/dL — ABNORMAL HIGH (ref 70–99)

## 2011-05-31 LAB — CBC
HCT: 34 % — ABNORMAL LOW (ref 39.0–52.0)
Hemoglobin: 11 g/dL — ABNORMAL LOW (ref 13.0–17.0)
MCH: 29 pg (ref 26.0–34.0)
MCHC: 32.4 g/dL (ref 30.0–36.0)
MCV: 89.7 fL (ref 78.0–100.0)
Platelets: 340 K/uL (ref 150–400)
RBC: 3.79 MIL/uL — ABNORMAL LOW (ref 4.22–5.81)
RDW: 13.1 % (ref 11.5–15.5)
WBC: 9.4 K/uL (ref 4.0–10.5)

## 2011-05-31 LAB — COMPREHENSIVE METABOLIC PANEL
ALT: 56 U/L — ABNORMAL HIGH (ref 0–53)
AST: 25 U/L (ref 0–37)
Alkaline Phosphatase: 58 U/L (ref 39–117)
CO2: 30 mEq/L (ref 19–32)
Chloride: 104 mEq/L (ref 96–112)
GFR calc Af Amer: >60 mL/min (ref >60)
GFR calc non Af Amer: >60 mL/min (ref >60)
Glucose, Bld: 154 mg/dL — ABNORMAL HIGH (ref 70–99)
Potassium: 3.5 mEq/L (ref 3.5–5.1)
Sodium: 141 mEq/L (ref 135–145)

## 2011-05-31 LAB — DIFFERENTIAL
Basophils Absolute: 0 10*3/uL (ref 0.0–0.1)
Basophils Relative: 0 % (ref 0–1)
Eosinophils Absolute: 0.1 10*3/uL (ref 0.0–0.7)
Monocytes Relative: 8 % (ref 3–12)
Neutro Abs: 6.6 10*3/uL (ref 1.7–7.7)
Neutrophils Relative %: 70 % (ref 43–77)

## 2011-06-01 ENCOUNTER — Other Ambulatory Visit (HOSPITAL_COMMUNITY): Payer: Self-pay | Admitting: Neurology

## 2011-06-01 LAB — DIFFERENTIAL
Basophils Absolute: 0 10*3/uL (ref 0.0–0.1)
Basophils Relative: 0 % (ref 0–1)
Eosinophils Absolute: 0.1 10*3/uL (ref 0.0–0.7)
Monocytes Absolute: 1 10*3/uL (ref 0.1–1.0)
Monocytes Relative: 9 % (ref 3–12)
Neutro Abs: 8 10*3/uL — ABNORMAL HIGH (ref 1.7–7.7)
Neutrophils Relative %: 69 % (ref 43–77)

## 2011-06-01 LAB — COMPREHENSIVE METABOLIC PANEL
BUN: 19 mg/dL (ref 6–23)
Creatinine, Ser: 0.73 mg/dL (ref 0.50–1.35)
GFR calc Af Amer: >60 mL/min (ref >60)
GFR calc non Af Amer: >60 mL/min (ref >60)
Potassium: 3.3 mEq/L — ABNORMAL LOW (ref 3.5–5.1)
Sodium: 140 mEq/L (ref 135–145)
Total Bilirubin: 0.4 mg/dL (ref 0.3–1.2)

## 2011-06-01 LAB — CBC
Hemoglobin: 10.9 g/dL — ABNORMAL LOW (ref 13.0–17.0)
MCH: 29.3 pg (ref 26.0–34.0)
MCHC: 33 g/dL (ref 30.0–36.0)
Platelets: 354 10*3/uL (ref 150–400)

## 2011-06-01 LAB — MAGNESIUM: Magnesium: 2.1 mg/dL (ref 1.5–2.5)

## 2011-06-01 LAB — GLUCOSE, CAPILLARY: Glucose-Capillary: 143 mg/dL — ABNORMAL HIGH (ref 70–99)

## 2011-06-01 NOTE — Discharge Summary (Signed)
[name], Joshua [name]                 ACCOUNT NO.:  [id number]  MEDICAL RECORD NO.:  [id number]  LOCATION:  1508                         FACILITY:  [facility]  PHYSICIAN:  Talmage Nap, MD  DATE OF BIRTH:  Aug 04, 1946  DATE OF ADMISSION:  05/19/2011 DATE OF DISCHARGE:  06/01/2011                        DISCHARGE SUMMARY - REFERRING   PRIMARY CARE PHYSICIAN:  Dr. Clelia Croft.  CONSULTANTS INVOLVED IN THE CASE: 1. PCCM. 2. Neurology. 3.Dr Leonette Most Rashan [name](05/30/2011-06/01/2011)  DISCHARGE DIAGNOSES: 1. Ventilator-dependent respiratory failure status post extubation.     The patient tolerated atmospheric oxygen. 2. Myasthenia gravis flare-up. 3. Hypertension. 4. Diabetes mellitus. 5. Anemia. 6. Hyperlipidemia. 7. Deconditioning.  The patient is a 65 year old Caucasian male with history of myasthenia gravis was admitted to the hospital on May 19, 2011 by Dr. Della Goo with a one-day history of dysphagia and dysarthria.  Symptoms were said to have been getting progressively worse.  The patient was said to have stopped taking his Mestinon because of multiple bouts of diarrhea.  He, however, presented to the emergency room because he was having progressive difficulty in swallowing, weakness, and he was also dysarthric.  The patient was also observed to be having difficulty in controlling secretion coming out from his nasopharynx.  There was, however, no history of fever.  There was no history of chills.  There was no history of rigor and subsequently admitted.  On admission, the patient had progressive hypoxemia and decline in mentation.  An EKG done showed multiple cardiac arrhythmia.  He was subsequently intubated and mechanically ventilated.  Management on vent and in the ICU was essentially done by PCCM.  PREADMISSION MEDICATIONS:  Include Lipitor, Azor, hydrochlorothiazide, metformin, tramadol, pyridostigmine, hyoscyamine, insulin 75/25.  ALLERGIES:  He has no known  allergies.  PAST SURGICAL HISTORY:  Open reduction and internal fixation of left patellar fracture.  SOCIAL HISTORY:  Essentially documented in the initial history and physical.  FAMILY HISTORY:  Essentially documented in the initial history and physical.  REVIEW OF SYSTEMS:  Essentially documented in the initial history and physical.  PHYSICAL EXAMINATION:  At time the patient was seen by the admitting physician, GENERAL:  He was said to be in discomfort, but not in any acute distress. VITAL SIGNS:  Temperature 97.7, blood pressure 124/72, heart rate 116, respiratory rate 19 and he was saturating 95% on room air.  HEENT: Pallor, but pupils were reactive to light and extraocular muscles were intact. NECK:  He had no jugular venous distention.  No carotid bruit and no lymphadenopathy. CHEST:  Clear to auscultation. HEART:  Sounds 1 and 2. ABDOMEN:  Soft, nontender.  Liver, spleen tip not palpable.  Bowel sounds were positive. EXTREMITIES:  Showed no pedal edema. NEUROLOGIC EXAM:  Showed the patient to be generally weak, but there were no neurolateralizing signs.  LAB DATA:  Complete blood count with differential done on admission showed WBC of 10.6, hemoglobin of 14.2, hematocrit of 41.5, MCV of 87.2 with a platelet count of 298, normal differential.  Coagulation profile showed PT 13.3, INR 0.99.  Comprehensive metabolic panel showed sodium of 138, potassium of 4.1, chloride of 100 with a bicarb of 26.  Glucose is 153, BUN  is 26, creatinine is 1.17.  Urinalysis unremarkable. Routine MRSA screening negative.  TSH 0.458, hemoglobin A1c 7.5. Arterial blood gas done on CPAP showed pH of 7.27, pCO2 of 44, pO2 of 90 with a bicarb of 19.8.  Pro-BNP 2095.  Upper respiratory bronchoalveolar lavage culture showed rare WBC present, predominantly PMN, no organisms seen, nonpathogenic oropharyngeal-type flora, isolated.  This was done on May 22, 2011.  Vitamin B12 609, folic acid  8.0 normal.  Cardiac marker done on May 29, 2011 was essentially unremarkable and complete blood count with no differential done on May 30, 2011 showed WBC of 12.4, hemoglobin 10.9, hematocrit 33.5, MCV of 90.8, platelet count of 346.  Basic metabolic panel showed sodium of 142, potassium of 3.7, chloride of 106, bicarb of 32.  Glucose is 138, BUN is 23, creatinine 0.77, magnesium level 2.2, phosphorus 3.4.  A repeat complete blood count with differential done on June 01, 2011 showed WBC of 11.5, hemoglobin of 10.9, hematocrit of 33.0, MCV of 88.7, platelet count of 354 with normal differential.  Comprehensive metabolic panel showed sodium of 142, potassium of 3.3, chloride of 102 with a bicarb of 31.  Glucose is 112, BUN is 19, creatinine is 0.73 and magnesium level is 2.1.  Imaging study done include chest x-ray on admission.  It showed no active disease,x-ray neck soft tissue, showed no acute finding.  A repeat chest x-ray done on May 20, 2011 showed no acute abnormality identified.  There is mild bibasilar atelectasis, otherwise lung was clear.  Repeat chest x-ray done on May 31, 2011 showed low-volume lung with pulmonary vascular congestion and perihilar atelectasis and a swallow evaluation done on September 20 showed no dysphagia.  HOSPITAL COURSE:  The patient was initially admitted to [facility] on May 20, 2011 and essentially, the patient had NG tube inserted and Mestinon was started via the NG tube and he was also given IV gammaglobulin.  The patient was placed on Accu-Cheks with regular insulin sliding scale.  The patient's condition, however, continued to deteriorate and he was observed to be having progressive hypoxemia with decreased mentation and PCCM was consulted, evaluated the patient, and subsequently the patient was intubated  and mechanically ventilated.  The patient was managed by PCCM and during this period,  the patient had NG tube inserted and he was fed with a Jevity.  The patient was extubated on May 29, 2011, was seen by me post-extubation on May 30, 2011.  During this encounter, the patient denied any specific complaint.  NG tube was still in situ and examination of the patient showed minimal scattered rhonchi.  The patient was continued on breathing treatment, i.e., albuterol and Atrovent nebulizers.  It is important to mention that during intubation, the patient was on Zosyn and dose adjustment was done by pharmacy.  He was also found to be hypokalemic and was subsequently corrected by giving the patient potassium rounds.  The patient's blood pressure was maintained with clonidine 0.1 mg q.6 via the NG tube and GI prophylaxis was done with Protonix and also, a DVT prophylaxis was done with heparin subcutaneous 5000 units q.8 hourly.  In addition, the patient was also given Mestinon and prednisone via the NG tube.  At time the patient was seen by me, Physical Therapy was consulted for gradual ambulation.  Hemoglobin A1c was ordered and serum folate and vitamin B12 levels were also ordered as well.  After the patient was stabilized in ICU, he was transferred to general medical  floor/Telemetry.  He initially had a swallow evaluation done, which was unsuccessful and a repeat swallow evaluation done on May 31, 2011 was unremarkable.  NG tube with feed was discontinued and he was started on p.o. feeds, which he tolerated.  At the same time, the patient was started on Azor 10/40 one p.o. daily, metformin 500 mg p.o. b.i.d., and Lipitor 10 mg p.o. daily.  He was reevaluated by me today, which is June 01, 2011.  He denied any specific complaint, tolerating atmospheric oxygen, and examination of the patient was essentially unremarkable.  So far, the patient had remained clinically stable.  Plan is for the patient to be discharged home today on home health PT/OT.  He will  continue on diabetic ADA diet.  He will have a rolling walker as well as  commode aids in his toilet in his house.  MEDICATIONS:  To be taken at home will include the following: 1. KCl 10 mEq one p.o. b.i.d. 2. Prednisone 20 mg p.o. daily. 3. Azor 5/40 one p.o. daily. 4. Benadryl (diphenhydramine) 25 mg one p.o. q.6 p.r.n. 5. Glucosamine/chondroitin over-the-counter one p.o. b.i.d. 6. Humalog insulin 75/25 45 units subcutaneous b.i.d. 7. Hydrochlorothiazide 25 mg one p.o. daily. 8. Hyoscyamine 0.125 mg p.o. t.i.d. 9. Loperamide 2 mg one to two capsules for diarrhea motion, not to     exceed four tablets in 24 hours. 10.Lipitor (atorvastatin) 40 mg one p.o. daily at bedtime. 11.Metformin 1000 mg one p.o. b.i.d. 12.Ocuvite (beta-carotene) with mineral one p.o. daily. 13.Prilosec (omeprazole) 20 mg over-the-counter one p.o. daily. 14.Pyridostigmine (Mestinon) 60 mg half a tablet p.o. t.i.d. 15.Tramadol 50 mg two tablets p.o. b.i.d. 16.Vimovo (naproxen/esomeprazole magnesium) 500/200 one p.o. b.i.d.     Talmage Nap, MD     CN/MEDQ  D:  06/01/2011  T:  06/01/2011  Job:  [id number]  cc:   Dr. Clelia Croft  Electronically Signed by Talmage Nap  on 06/01/2011 04:54:09 PM

## 2011-06-07 ENCOUNTER — Ambulatory Visit (HOSPITAL_COMMUNITY): Payer: 59

## 2011-06-07 ENCOUNTER — Other Ambulatory Visit (HOSPITAL_COMMUNITY): Payer: 59

## 2011-06-08 ENCOUNTER — Ambulatory Visit (HOSPITAL_COMMUNITY)
Admission: RE | Admit: 2011-06-08 | Discharge: 2011-06-08 | Disposition: A | Discharge status: Home / Self Care | Payer: 59 | Source: Ambulatory Visit | Attending: Neurology | Admitting: Neurology

## 2011-06-08 DIAGNOSIS — R1310 Dysphagia, unspecified: Secondary | ICD-10-CM | POA: Insufficient documentation

## 2011-07-02 ENCOUNTER — Ambulatory Visit: Payer: 59 | Attending: Neurology | Admitting: Rehabilitative and Restorative Service Providers"

## 2011-07-02 DIAGNOSIS — IMO0001 Reserved for inherently not codable concepts without codable children: Secondary | ICD-10-CM | POA: Insufficient documentation

## 2011-07-02 DIAGNOSIS — M6281 Muscle weakness (generalized): Secondary | ICD-10-CM | POA: Insufficient documentation

## 2011-07-02 DIAGNOSIS — R269 Unspecified abnormalities of gait and mobility: Secondary | ICD-10-CM | POA: Insufficient documentation

## 2011-07-02 DIAGNOSIS — M549 Dorsalgia, unspecified: Secondary | ICD-10-CM | POA: Insufficient documentation

## 2011-07-05 ENCOUNTER — Ambulatory Visit: Payer: 59 | Admitting: Physical Therapy

## 2011-07-09 ENCOUNTER — Ambulatory Visit: Payer: 59 | Admitting: Physical Therapy

## 2011-07-12 ENCOUNTER — Ambulatory Visit: Payer: 59 | Attending: Neurology | Admitting: Physical Therapy

## 2011-07-12 DIAGNOSIS — IMO0001 Reserved for inherently not codable concepts without codable children: Secondary | ICD-10-CM | POA: Insufficient documentation

## 2011-07-12 DIAGNOSIS — M6281 Muscle weakness (generalized): Secondary | ICD-10-CM | POA: Insufficient documentation

## 2011-07-12 DIAGNOSIS — R269 Unspecified abnormalities of gait and mobility: Secondary | ICD-10-CM | POA: Insufficient documentation

## 2011-07-12 DIAGNOSIS — M549 Dorsalgia, unspecified: Secondary | ICD-10-CM | POA: Insufficient documentation

## 2011-07-17 ENCOUNTER — Ambulatory Visit: Payer: 59 | Admitting: Physical Therapy

## 2011-07-19 ENCOUNTER — Ambulatory Visit: Payer: 59 | Admitting: Physical Therapy

## 2011-07-24 ENCOUNTER — Ambulatory Visit: Payer: 59 | Admitting: Physical Therapy

## 2011-07-26 ENCOUNTER — Ambulatory Visit: Payer: 59 | Admitting: Physical Therapy

## 2011-07-31 ENCOUNTER — Ambulatory Visit: Payer: 59 | Admitting: Physical Therapy

## 2011-09-18 DIAGNOSIS — E113299 Type 2 diabetes mellitus with mild nonproliferative diabetic retinopathy without macular edema, unspecified eye: Secondary | ICD-10-CM | POA: Insufficient documentation

## 2012-06-11 ENCOUNTER — Encounter: Payer: Self-pay | Admitting: Internal Medicine

## 2012-11-12 ENCOUNTER — Encounter: Payer: Self-pay | Admitting: Internal Medicine

## 2012-12-11 ENCOUNTER — Ambulatory Visit (INDEPENDENT_AMBULATORY_CARE_PROVIDER_SITE_OTHER): Payer: 59 | Admitting: Internal Medicine

## 2012-12-11 ENCOUNTER — Encounter: Payer: Self-pay | Admitting: Internal Medicine

## 2012-12-11 VITALS — BP 132/72 | HR 84 | Ht 68.0 in | Wt 267.5 lb

## 2012-12-11 DIAGNOSIS — E1059 Type 1 diabetes mellitus with other circulatory complications: Secondary | ICD-10-CM

## 2012-12-11 DIAGNOSIS — Z86010 Personal history of colonic polyps: Secondary | ICD-10-CM

## 2012-12-11 DIAGNOSIS — Z85038 Personal history of other malignant neoplasm of large intestine: Secondary | ICD-10-CM

## 2012-12-11 DIAGNOSIS — K219 Gastro-esophageal reflux disease without esophagitis: Secondary | ICD-10-CM

## 2012-12-11 DIAGNOSIS — R195 Other fecal abnormalities: Secondary | ICD-10-CM

## 2012-12-11 MED ORDER — MOVIPREP 100 G PO SOLR: 1.0000 | Freq: Once | ORAL | Status: DC

## 2012-12-11 NOTE — Patient Instructions (Addendum)
You have been given a separate informational sheet regarding your tobacco use, the importance of quitting and local resources to help you quit.   You have been scheduled for a colonoscopy with propofol. Please follow written instructions given to you at your visit today.  Please pick up your prep kit at the pharmacy within the next 1-3 days. If you use inhalers (even only as needed), please bring them with you on the day of your procedure.  Per Dr. Marina Goodell, hold your insulin and oral diabetic medication the morning of the procedure.

## 2012-12-11 NOTE — Progress Notes (Signed)
HISTORY OF PRESENT ILLNESS:  Joshua [name] is a 67 y.o. male with multiple medical problems including hypertension, hyperlipidemia, insulin requiring diabetes mellitus, morbid obesity, myasthenia gravis, colon cancer, and adenomatous colon polyps. The patient presents today regarding Hemoccult-positive stool and surveillance colonoscopy. 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 No residual cancer on his surgical specimen. He has had surveillance colonoscopy in 2007 and most recently August 2010. His most recent exam he was found to have 5 polyps and moderate diverticulosis as well as normal postoperative change. Followup in 2 years recommended. However, in 2012 he was diagnosed with myasthenia gravis. He is now doing well from that standpoint on medical therapy. His other chronic medical problems are also stable. Recent evaluation with his primary provider revealed Hemoccult-positive stool on routine testing. Review of outside laboratories finds a hemoglobin of 13.0. Normal comprehensive metabolic panel. Hemoglobin A1c 7.7% patient's GI review of systems is unremarkable. He does have reflux disease which is controlled with Dexilant therapy.  REVIEW OF SYSTEMS:  All non-GI ROS negative except for anxiety, hearing problems, itching, increased thirst, excessive urination  Past Medical History  Diagnosis Date  . Colon cancer   . Myasthenia gravis   . Diabetes   . Hypertension   . Hyperlipidemia   . Morbid obesity   . Anemia   . Diverticulosis   . Hemorrhoids   . Colon polyps   . DJD (degenerative joint disease)   . Anxiety   . Arthritis   . Basal cell carcinoma     Past Surgical History  Procedure Laterality Date  . Right colectomy    . Patella fracture surgery Left 2011  . Ankle arthroplasty Right 1969  . Tonsillectomy  1968  . Basal cell carcinoma excision      face     Social History REYANSH KUSHNIR  reports that he has been smoking Cigars.  He has never used smokeless tobacco. He reports that  drinks alcohol. He reports that he does not use illicit drugs.  family history includes Colon polyps in his maternal grandfather; Heart disease in his mother; and Prostate cancer in his maternal grandfather.  No Known Allergies     PHYSICAL EXAMINATION: Vital signs: BP 132/72  Pulse 84  Ht 5\' 8"  (1.727 m)  Wt 267 lb 8 oz (121.337 kg)  BMI 40.68 kg/m2  Constitutional: Obese, generally well-appearing, no acute distress Psychiatric: alert and oriented x3, cooperative Eyes: extraocular movements intact, anicteric, conjunctiva pink Mouth: oral pharynx moist, no lesions Neck: supple no lymphadenopathy Cardiovascular: heart regular rate and rhythm, no murmur Lungs: clear to auscultation bilaterally Abdomen: soft, obese, nontender, nondistended, no obvious ascites, no peritoneal signs, normal bowel sounds, no organomegaly Rectal: Deferred until colonoscopy Extremities: no lower extremity edema bilaterally Skin: no lesions on visible extremities Neuro: No focal deficits.  ASSESSMENT:  #1. Hemoccult-positive stool. Negative GI review of systems. Normal hemoglobin. On chronic PPI #2. GERD. Asymptomatic on PPI #3. Stage I colon cancer February 2006 status post right hemicolectomy. #4. History of adenomatous colon polyps. Last colonoscopy 2010 #5. Multiple medical problems including insulin requiring diabetes. Stable   PLAN:  #1. Reflux precautions #2. Continue PPI #3. Schedule surveillance colonoscopy. The patient is high-risk given his comorbidities.The nature of the procedure, as well as the risks, benefits, and alternatives were carefully and thoroughly reviewed with the patient. Ample time for discussion and questions allowed. The patient understood, was  satisfied, and agreed to proceed. Movi prep prescribed. The patient instructed on its use #4. Hold  diabetic medications the morning of the procedure. Take all other medications as prescribed

## 2012-12-19 ENCOUNTER — Ambulatory Visit (INDEPENDENT_AMBULATORY_CARE_PROVIDER_SITE_OTHER): Payer: 59 | Admitting: *Deleted

## 2012-12-19 DIAGNOSIS — E538 Deficiency of other specified B group vitamins: Secondary | ICD-10-CM

## 2012-12-19 MED ORDER — CYANOCOBALAMIN 1000 MCG/ML IJ SOLN
1000.0000 ug | INTRAMUSCULAR | Status: AC
  Administered 2012-12-19 – 2013-04-22 (×5): 1000 ug via INTRAMUSCULAR

## 2012-12-19 NOTE — Patient Instructions (Signed)
Pt to come back next month.

## 2012-12-19 NOTE — Progress Notes (Signed)
Pt here for B 12 injection.  Under aseptic technique cyanocobalamin 1000mcg/1ml IM given L deltoid.  Tolerated well.  Bandaid applied.  

## 2012-12-30 ENCOUNTER — Ambulatory Visit (AMBULATORY_SURGERY_CENTER): Payer: 59 | Admitting: Internal Medicine

## 2012-12-30 ENCOUNTER — Other Ambulatory Visit: Payer: Self-pay | Admitting: Internal Medicine

## 2012-12-30 ENCOUNTER — Encounter: Payer: Self-pay | Admitting: Internal Medicine

## 2012-12-30 VITALS — BP 131/78 | HR 73 | Temp 97.6°F | Resp 16 | Ht 68.0 in | Wt 267.0 lb

## 2012-12-30 DIAGNOSIS — Z1211 Encounter for screening for malignant neoplasm of colon: Secondary | ICD-10-CM

## 2012-12-30 DIAGNOSIS — R195 Other fecal abnormalities: Secondary | ICD-10-CM

## 2012-12-30 DIAGNOSIS — K219 Gastro-esophageal reflux disease without esophagitis: Secondary | ICD-10-CM

## 2012-12-30 DIAGNOSIS — Z860100 Personal history of colon polyps, unspecified: Secondary | ICD-10-CM

## 2012-12-30 DIAGNOSIS — Z85038 Personal history of other malignant neoplasm of large intestine: Secondary | ICD-10-CM

## 2012-12-30 MED ORDER — DEXTROSE 5 % IV SOLN: INTRAVENOUS | Status: DC

## 2012-12-30 MED ORDER — SODIUM CHLORIDE 0.9 % IV SOLN: 500.0000 mL | INTRAVENOUS | Status: DC

## 2012-12-30 NOTE — Progress Notes (Signed)
Patient did not experience any of the following events: a burn prior to discharge; a fall within the facility; wrong site/side/patient/procedure/implant event; or a hospital transfer or hospital admission upon discharge from the facility. (G8907) Patient did not have preoperative order for IV antibiotic SSI prophylaxis. (G8918)  

## 2012-12-30 NOTE — Op Note (Signed)
[facility] [address] [place], [address]   COLONOSCOPY PROCEDURE REPORT  PATIENT: Joshua [name], Joshua [name]  MR#: [id number] BIRTHDATE: 02/13/1946 , 66  yrs. old GENDER: Male ENDOSCOPIST: Roxy Cedar, MD REFERRED [id number] Program Recall PROCEDURE DATE:  12/30/2012 PROCEDURE:   Colonoscopy, surveillance ASA CLASS:   Class II INDICATIONS:High risk patient with personal history of colon cancer (10-2000; stage I; s/p right hemicolectomy), Patient's personal history of adenomatous colon polyps (0347,4259), and heme-positive stool (out patient PCP office). MEDICATIONS: MAC sedation, administered by CRNA and propofol (Diprivan) 300mg  IV  DESCRIPTION OF PROCEDURE:   After the risks benefits and alternatives of the procedure were thoroughly explained, informed consent was obtained.  A digital rectal exam revealed no abnormalities of the rectum.   The LB [id number]  endoscope was introduced through the anus and advanced to the surgical anastomosis. No adverse events experienced.   The quality of the prep was excellent, using MoviPrep  The instrument was then slowly withdrawn as the colon was fully examined.      COLON FINDINGS: There was evidence of a prior ileocolonic surgical anastomosis The finding was in the right colon.   Moderate diverticulosis was noted The finding was in the left colon.   The colon mucosa was otherwise normal.  Retroflexed views revealed internal hemorrhoids. The time to cecum=2 minutes 01 seconds. Withdrawal time=7 minutes 22 seconds.  The scope was withdrawn and the procedure completed. COMPLICATIONS: There were no complications.  ENDOSCOPIC IMPRESSION: 1.   There was evidence of a prior ileocolonic surgical anastomosis in the right colon 2.   Moderate diverticulosis was noted in the left colon 3.   The colon mucosa was otherwise normal  RECOMMENDATIONS: 1. Follow up colonoscopy in 5 years   eSigned:  Roxy Cedar,  MD 12/30/2012 10:11 AM   cc: Jarome Matin, MD, Hali Marry, MD, and Zelphia Cairo MD   PATIENT NAME:  [name], [name] MR#: [id number]

## 2012-12-30 NOTE — Patient Instructions (Addendum)
YOU HAD AN ENDOSCOPIC PROCEDURE TODAY AT THE [facility]: Refer to the procedure report that was given to you for any specific questions about what was found during the examination.  If the procedure report does not answer your questions, please call your gastroenterologist to clarify.  If you requested that your care partner not be given the details of your procedure findings, then the procedure report has been included in a sealed envelope for you to review at your convenience later.  YOU SHOULD EXPECT: Some feelings of bloating in the abdomen. Passage of more gas than usual.  Walking can help get rid of the air that was put into your GI tract during the procedure and reduce the bloating. If you had a lower endoscopy (such as a colonoscopy or flexible sigmoidoscopy) you may notice spotting of blood in your stool or on the toilet paper. If you underwent a bowel prep for your procedure, then you may not have a normal bowel movement for a few days.  DIET: Your first meal following the procedure should be a light meal and then it is ok to progress to your normal diet.  A half-sandwich or bowl of soup is an example of a good first meal.  Heavy or fried foods are harder to digest and may make you feel nauseous or bloated.  Likewise meals heavy in dairy and vegetables can cause extra gas to form and this can also increase the bloating.  Drink plenty of fluids but you should avoid alcoholic beverages for 24 hours.  ACTIVITY: Your care partner should take you home directly after the procedure.  You should plan to take it easy, moving slowly for the rest of the day.  You can resume normal activity the day after the procedure however you should NOT DRIVE or use heavy machinery for 24 hours (because of the sedation medicines used during the test).    SYMPTOMS TO REPORT IMMEDIATELY: A gastroenterologist can be reached at any hour.  During normal business hours, 8:30 AM to 5:00 PM Monday through Friday,  call [phone].  After hours and on weekends, please call the GI answering service at [phone] who will take a message and have the physician on call contact you.   Following lower endoscopy (colonoscopy or flexible sigmoidoscopy):  Excessive amounts of blood in the stool  Significant tenderness or worsening of abdominal pains  Swelling of the abdomen that is new, acute  Fever of 100F or higher       FOLLOW UP: If any biopsies were taken you will be contacted by phone or by letter within the next 1-3 weeks.  Call your gastroenterologist if you have not heard about the biopsies in 3 weeks.  Our staff will call the home number listed on your records the next business day following your procedure to check on you and address any questions or concerns that you may have at that time regarding the information given to you following your procedure. This is a courtesy call and so if there is no answer at the home number and we have not heard from you through the emergency physician on call, we will assume that you have returned to your regular daily activities without incident.  SIGNATURES/CONFIDENTIALITY: You and/or your care partner have signed paperwork which will be entered into your electronic medical record.  These signatures attest to the fact that that the information above on your After Visit Summary has been reviewed and is understood.  Full responsibility  of the confidentiality of this discharge information lies with you and/or your care-partner.  Diverticulosis, high fiber diet information given.

## 2012-12-31 ENCOUNTER — Telehealth: Payer: Self-pay

## 2012-12-31 NOTE — Telephone Encounter (Signed)
  Follow up Call-  Call back number 12/30/2012  Post procedure Call Back phone  # [phone]  Permission to leave phone message Yes     Patient questions:  Do you have a fever, pain , or abdominal swelling? no Pain Score  0 *  Have you tolerated food without any problems? yes  Have you been able to return to your normal activities? yes  Do you have any questions about your discharge instructions: Diet   no Medications  no Follow up visit  no  Do you have questions or concerns about your Care? no  Actions: * If pain score is 4 or above: No action needed, pain <4.   No problems per the pt. Maw

## 2013-01-01 ENCOUNTER — Other Ambulatory Visit (HOSPITAL_COMMUNITY): Payer: Self-pay | Admitting: Neurology

## 2013-01-19 ENCOUNTER — Ambulatory Visit (INDEPENDENT_AMBULATORY_CARE_PROVIDER_SITE_OTHER): Payer: 59 | Admitting: *Deleted

## 2013-01-19 DIAGNOSIS — E538 Deficiency of other specified B group vitamins: Secondary | ICD-10-CM

## 2013-01-19 NOTE — Patient Instructions (Addendum)
Counseled patient return in 30 days. He can schedule appointment on way out. He should not have next administration before 30 days. Su Ley BS RN

## 2013-01-19 NOTE — Progress Notes (Signed)
Patient here for B12 injection.  Under aseptic technique cyanocobalamin 1047mcg/1ml given IM in right deltoid.  Tolerated well.

## 2013-02-20 ENCOUNTER — Ambulatory Visit (INDEPENDENT_AMBULATORY_CARE_PROVIDER_SITE_OTHER): Payer: 59 | Admitting: *Deleted

## 2013-02-20 DIAGNOSIS — E538 Deficiency of other specified B group vitamins: Secondary | ICD-10-CM

## 2013-02-20 NOTE — Progress Notes (Signed)
Pt here for B 12 injection.  Under aseptic technique cyanocobalamin 1027mcg/1ml IM given Left Deltoid.  Tolerated well.  Bandaid applied.

## 2013-02-20 NOTE — Patient Instructions (Addendum)
Discussed improved memory with continued use of B12. Patient will schedule next office visit for 30 days from now.

## 2013-02-22 ENCOUNTER — Other Ambulatory Visit (HOSPITAL_COMMUNITY): Payer: Self-pay | Admitting: Neurology

## 2013-03-23 ENCOUNTER — Ambulatory Visit (INDEPENDENT_AMBULATORY_CARE_PROVIDER_SITE_OTHER): Payer: 59

## 2013-03-23 DIAGNOSIS — E538 Deficiency of other specified B group vitamins: Secondary | ICD-10-CM

## 2013-03-23 NOTE — Patient Instructions (Signed)
Revisit on 30 days on Apr 22 2013 @ 1630

## 2013-04-22 ENCOUNTER — Ambulatory Visit (INDEPENDENT_AMBULATORY_CARE_PROVIDER_SITE_OTHER): Payer: 59 | Admitting: *Deleted

## 2013-04-22 DIAGNOSIS — E538 Deficiency of other specified B group vitamins: Secondary | ICD-10-CM

## 2013-04-22 NOTE — Progress Notes (Signed)
Pt here for B 12 injection.  Under aseptic technique cyanocobalamin 1000mcg/1ml IM given R deltoid.  Tolerated well.  Bandaid applied.  

## 2013-04-22 NOTE — Patient Instructions (Signed)
Will see next month 05-19-13 when has appt with Dr. Anne Hahn.

## 2013-04-23 ENCOUNTER — Other Ambulatory Visit (HOSPITAL_COMMUNITY): Payer: Self-pay | Admitting: Neurology

## 2013-05-13 ENCOUNTER — Encounter: Payer: Self-pay | Admitting: Neurology

## 2013-05-13 DIAGNOSIS — H02409 Unspecified ptosis of unspecified eyelid: Secondary | ICD-10-CM

## 2013-05-13 DIAGNOSIS — G5600 Carpal tunnel syndrome, unspecified upper limb: Secondary | ICD-10-CM | POA: Insufficient documentation

## 2013-05-13 DIAGNOSIS — E538 Deficiency of other specified B group vitamins: Secondary | ICD-10-CM

## 2013-05-13 DIAGNOSIS — R0602 Shortness of breath: Secondary | ICD-10-CM

## 2013-05-13 DIAGNOSIS — G7001 Myasthenia gravis with (acute) exacerbation: Secondary | ICD-10-CM

## 2013-05-13 DIAGNOSIS — R490 Dysphonia: Secondary | ICD-10-CM

## 2013-05-13 DIAGNOSIS — K219 Gastro-esophageal reflux disease without esophagitis: Secondary | ICD-10-CM

## 2013-05-19 ENCOUNTER — Encounter: Payer: Self-pay | Admitting: Neurology

## 2013-05-19 ENCOUNTER — Ambulatory Visit (INDEPENDENT_AMBULATORY_CARE_PROVIDER_SITE_OTHER): Payer: 59 | Admitting: Neurology

## 2013-05-19 ENCOUNTER — Ambulatory Visit (INDEPENDENT_AMBULATORY_CARE_PROVIDER_SITE_OTHER): Payer: 59 | Admitting: *Deleted

## 2013-05-19 VITALS — BP 148/70 | HR 64 | Wt 270.0 lb

## 2013-05-19 DIAGNOSIS — Z5181 Encounter for therapeutic drug level monitoring: Secondary | ICD-10-CM

## 2013-05-19 DIAGNOSIS — G7000 Myasthenia gravis without (acute) exacerbation: Secondary | ICD-10-CM

## 2013-05-19 DIAGNOSIS — E538 Deficiency of other specified B group vitamins: Secondary | ICD-10-CM

## 2013-05-19 HISTORY — DX: Myasthenia gravis without (acute) exacerbation: G70.00

## 2013-05-19 LAB — COMPREHENSIVE METABOLIC PANEL
Albumin/Globulin Ratio: 2.3 (ref 1.1–2.5)
Albumin: 4.1 g/dL (ref 3.6–4.8)
Alkaline Phosphatase: 95 IU/L (ref 39–117)
BUN/Creatinine Ratio: 14 (ref 10–22)
BUN: 16 mg/dL (ref 8–27)
CO2: 30 mmol/L — ABNORMAL HIGH (ref 18–29)
Creatinine, Ser: 1.15 mg/dL (ref 0.76–1.27)
GFR calc Af Amer: 76 mL/min/{1.73_m2} (ref >59)
GFR calc non Af Amer: 66 mL/min/{1.73_m2} (ref >59)
Globulin, Total: 1.8 g/dL (ref 1.5–4.5)
Total Bilirubin: 0.3 mg/dL (ref 0.0–1.2)

## 2013-05-19 LAB — CBC WITH DIFFERENTIAL
Eos: 1 % (ref 0–5)
Lymphocytes Absolute: 2.7 10*3/uL (ref 0.7–3.1)
MCH: 28.2 pg (ref 26.6–33.0)
MCHC: 33.2 g/dL (ref 31.5–35.7)
MCV: 85 fL (ref 79–97)
Monocytes Absolute: 0.9 10*3/uL (ref 0.1–0.9)
Platelets: 300 10*3/uL (ref 150–379)
RBC: 4.76 x10E6/uL (ref 4.14–5.80)

## 2013-05-19 MED ORDER — CYANOCOBALAMIN 1000 MCG/ML IJ SOLN
1000.0000 ug | INTRAMUSCULAR | Status: AC
  Administered 2013-05-19 – 2013-09-23 (×5): 1000 ug via INTRAMUSCULAR

## 2013-05-19 NOTE — Patient Instructions (Signed)
Stop the levsin, take 1/2 mestinon three times a day

## 2013-05-19 NOTE — Progress Notes (Signed)
Pt here for B 12 injection.  Under aseptic technique cyanocobalamin 1000mcg/1ml IM given L deltoid.  Tolerated well.  Bandaid applied.  

## 2013-05-19 NOTE — Progress Notes (Signed)
Reason for visit: Myasthenia gravis  Joshua [name] is an 67 y.o. male  History of present illness:  Joshua [name] is a 67 year old right-handed white male with a history of myasthenia gravis. The patient was diagnosed 2 years ago, and he presented with dysphagia and difficulty with chewing. The patient had a significant weight loss associated with this episode. The patient did have ptosis of the eyes, without double vision. The patient currently is on CellCept, prednisone, and Mestinon. The patient indicates that the Mestinon has caused diarrhea, and he was placed on hyoscyamine for this. The patient indicates that he has been fairly stable since last seen in November 2013. The patient denies any weakness of the extremities, he will occasionally have some ptosis in the late evenings when he is tired. The patient denies any problems with speech or swallowing. The patient denies double vision. The patient is going to have carpal tunnel syndrome surgery in the near future. He has a history of bilateral carpal tunnel syndrome.  Past Medical History  Diagnosis Date  . Colon cancer   . Myasthenia gravis   . Diabetes   . Hypertension   . Hyperlipidemia   . Morbid obesity   . Anemia   . Diverticulosis   . Hemorrhoids   . Colon polyps   . DJD (degenerative joint disease)   . Anxiety   . Arthritis   . Basal cell carcinoma   . Myasthenia gravis without exacerbation 05/19/2013  . Vitamin B12 deficiency   . Carpal tunnel syndrome, bilateral     Past Surgical History  Procedure Laterality Date  . Right colectomy    . Patella fracture surgery Left 2011  . Ankle arthroplasty Right 1969  . Tonsillectomy  1968  . Basal cell carcinoma excision      face    Family History  Problem Relation Age of Onset  . Prostate cancer Maternal Grandfather   . Colon polyps Maternal Grandfather   . Heart disease Mother   . Colon cancer Neg Hx     Social history:  reports that he has been smoking Cigars.  He  has never used smokeless tobacco. He reports that  drinks alcohol. He reports that he does not use illicit drugs.   No Known Allergies  Medications:  Current Outpatient Prescriptions on File Prior to Visit  Medication Sig Dispense Refill  . atorvastatin (LIPITOR) 40 MG tablet Take 40 mg by mouth daily.      . busPIRone (BUSPAR) 7.5 MG tablet Take 7.5 mg by mouth as needed.       . Cyanocobalamin (VITAMIN B-12 IJ) Inject 1,000 mcg as directed every 30 (thirty) days.      Marland Kitchen DEXILANT 60 MG capsule Take 60 mg by mouth daily.       . insulin aspart protamine-insulin aspart (NOVOLOG 70/30) (70-30) 100 UNIT/ML injection Inject 48 Units into the skin 2 (two) times daily with a meal.      . INVOKANA 300 MG TABS Take 1 tablet by mouth daily.       . metFORMIN (GLUCOPHAGE) 1000 MG tablet Take 1,000 mg by mouth daily with breakfast.       . Multiple Vitamins-Minerals (OCUVITE PO) Take 1 tablet by mouth daily.      . mycophenolate (CELLCEPT) 500 MG tablet TAKE 2 TABLETS TWICE A    DAY  360 tablet  0  . predniSONE (DELTASONE) 5 MG tablet Take 5 mg every other day alternating with 7.5 other days      .  traMADol (ULTRAM) 50 MG tablet Take 100 mg by mouth 3 (three) times daily.        No current facility-administered medications on file prior to visit.    ROS:  Out of a complete 14 system review of symptoms, the patient complains only of the following symptoms, and all other reviewed systems are negative.  Fatigue Ringing in the ears Itching Shortness of breath, cough Increased thirst Allergies Numbness, sleepiness  Blood pressure 148/70, pulse 64, weight 270 lb (122.471 kg).  Physical Exam  General: The patient is alert and cooperative at the time of the examination. The patient is moderately obese.  Skin: No significant peripheral edema is noted.   Neurologic Exam  Cranial nerves: Facial symmetry is present. Speech is normal, no aphasia or dysarthria is noted. Extraocular movements are  full. Visual fields are full. Proptosis is noted on the left eye. With superior gaze for 1 minute, there is no increased ptosis. The patient reports subjective double vision within 15 seconds of superior gaze. There is no weakness with jaw opening or closure or facial muscle weakness.  Motor: The patient has good strength in all 4 extremities. The patient has no fatigable weakness of the deltoid muscles with the arms outstretched for 1 minute.  Coordination: The patient has good finger-nose-finger and heel-to-shin bilaterally.  Gait and station: The patient has a normal gait. Tandem gait is slightly unsteady. Romberg is negative. No drift is seen.  Reflexes: Deep tendon reflexes are symmetric.   Assessment/Plan:  1. Myasthenia gravis  2. Diabetes  The patient is on hyoscyamine which is anti-cholinergic, and Mestinon which is cholinergic. The patient will stop hyoscyamine, and reduce the dose of the Mestinon taking one half tablet 3 times daily. The patient will remain on prednisone and CellCept for now at the current dose. The patient will have blood work done today, and he will followup in 6 months. We will recheck blood work again in 3 months. The patient will contact me if has problems with the medication changes.  Joshua Palau MD 05/19/2013 7:42 PM  [facility] [address] [place], [place] [address]  Phone [phone] Fax [phone]

## 2013-05-19 NOTE — Patient Instructions (Signed)
See next month. 

## 2013-05-20 ENCOUNTER — Other Ambulatory Visit (HOSPITAL_COMMUNITY): Payer: Self-pay | Admitting: Neurology

## 2013-05-21 NOTE — Progress Notes (Signed)
Quick Note:  LMVM on home VM for pt, labs ok. ______

## 2013-05-21 NOTE — Telephone Encounter (Signed)
Former [facility] patient assigned to Dr Willis  

## 2013-05-22 ENCOUNTER — Other Ambulatory Visit: Payer: Self-pay | Admitting: Orthopedic Surgery

## 2013-05-27 ENCOUNTER — Encounter (HOSPITAL_BASED_OUTPATIENT_CLINIC_OR_DEPARTMENT_OTHER): Payer: Self-pay | Admitting: *Deleted

## 2013-05-27 NOTE — Progress Notes (Signed)
Reviewed pt hx myasthenia gravis-ok with dr fitzgerald-be sure pt takes meds dos

## 2013-05-28 ENCOUNTER — Other Ambulatory Visit (HOSPITAL_COMMUNITY): Payer: Self-pay | Admitting: Neurology

## 2013-05-28 ENCOUNTER — Encounter (HOSPITAL_BASED_OUTPATIENT_CLINIC_OR_DEPARTMENT_OTHER): Payer: Self-pay | Admitting: *Deleted

## 2013-05-28 NOTE — Progress Notes (Signed)
To come in for ekg-had recent labs 05/19/13 Denies sleep apnea

## 2013-05-29 ENCOUNTER — Encounter (HOSPITAL_BASED_OUTPATIENT_CLINIC_OR_DEPARTMENT_OTHER)
Admission: RE | Admit: 2013-05-29 | Discharge: 2013-05-29 | Disposition: A | Discharge status: Home / Self Care | Payer: 59 | Source: Ambulatory Visit | Attending: Orthopedic Surgery | Admitting: Orthopedic Surgery

## 2013-05-29 ENCOUNTER — Other Ambulatory Visit: Payer: Self-pay

## 2013-05-29 DIAGNOSIS — Z01810 Encounter for preprocedural cardiovascular examination: Secondary | ICD-10-CM | POA: Insufficient documentation

## 2013-06-02 ENCOUNTER — Encounter (HOSPITAL_BASED_OUTPATIENT_CLINIC_OR_DEPARTMENT_OTHER): Payer: Self-pay | Admitting: Anesthesiology

## 2013-06-02 ENCOUNTER — Ambulatory Visit (HOSPITAL_BASED_OUTPATIENT_CLINIC_OR_DEPARTMENT_OTHER)
Admission: RE | Admit: 2013-06-02 | Discharge: 2013-06-02 | Disposition: A | Discharge status: Home / Self Care | Payer: 59 | Source: Ambulatory Visit | Attending: Orthopedic Surgery | Admitting: Orthopedic Surgery

## 2013-06-02 ENCOUNTER — Ambulatory Visit (HOSPITAL_BASED_OUTPATIENT_CLINIC_OR_DEPARTMENT_OTHER): Payer: 59 | Admitting: Anesthesiology

## 2013-06-02 ENCOUNTER — Encounter (HOSPITAL_BASED_OUTPATIENT_CLINIC_OR_DEPARTMENT_OTHER)
Admission: RE | Disposition: A | Discharge status: Home / Self Care | Payer: Self-pay | Source: Ambulatory Visit | Attending: Orthopedic Surgery

## 2013-06-02 ENCOUNTER — Encounter (HOSPITAL_BASED_OUTPATIENT_CLINIC_OR_DEPARTMENT_OTHER): Payer: Self-pay

## 2013-06-02 DIAGNOSIS — G5600 Carpal tunnel syndrome, unspecified upper limb: Secondary | ICD-10-CM | POA: Insufficient documentation

## 2013-06-02 DIAGNOSIS — G5620 Lesion of ulnar nerve, unspecified upper limb: Secondary | ICD-10-CM | POA: Insufficient documentation

## 2013-06-02 HISTORY — PX: CARPAL TUNNEL WITH CUBITAL TUNNEL: SHX5608

## 2013-06-02 LAB — GLUCOSE, CAPILLARY
Glucose-Capillary: 123 mg/dL — ABNORMAL HIGH (ref 70–99)
Glucose-Capillary: 157 mg/dL — ABNORMAL HIGH (ref 70–99)

## 2013-06-02 SURGERY — RELEASE, CARPAL TUNNEL AND CUBITAL TUNNEL
Anesthesia: General | Site: Hand | Laterality: Left | Wound class: Clean

## 2013-06-02 MED ORDER — MIDAZOLAM HCL 2 MG/2ML IJ SOLN: 0.5000 mg | Freq: Once | INTRAMUSCULAR | Status: DC | PRN

## 2013-06-02 MED ORDER — OXYCODONE HCL 5 MG PO TABS: 5.0000 mg | ORAL_TABLET | Freq: Once | ORAL | Status: DC | PRN

## 2013-06-02 MED ORDER — FENTANYL CITRATE 0.05 MG/ML IJ SOLN
INTRAMUSCULAR | Status: DC | PRN
  Administered 2013-06-02: 100 ug via INTRAVENOUS

## 2013-06-02 MED ORDER — PROMETHAZINE HCL 25 MG/ML IJ SOLN: 6.2500 mg | INTRAMUSCULAR | Status: DC | PRN

## 2013-06-02 MED ORDER — BUPIVACAINE HCL (PF) 0.25 % IJ SOLN
INTRAMUSCULAR | Status: DC | PRN
  Administered 2013-06-02: 10 mL

## 2013-06-02 MED ORDER — MIDAZOLAM HCL 2 MG/2ML IJ SOLN: 1.0000 mg | INTRAMUSCULAR | Status: DC | PRN

## 2013-06-02 MED ORDER — PROPOFOL 10 MG/ML IV BOLUS
INTRAVENOUS | Status: DC | PRN
  Administered 2013-06-02: 200 mg via INTRAVENOUS

## 2013-06-02 MED ORDER — MEPERIDINE HCL 25 MG/ML IJ SOLN: 6.2500 mg | INTRAMUSCULAR | Status: DC | PRN

## 2013-06-02 MED ORDER — LIDOCAINE HCL (CARDIAC) 20 MG/ML IV SOLN
INTRAVENOUS | Status: DC | PRN
  Administered 2013-06-02: 50 mg via INTRAVENOUS

## 2013-06-02 MED ORDER — FENTANYL CITRATE 0.05 MG/ML IJ SOLN: 50.0000 ug | INTRAMUSCULAR | Status: DC | PRN

## 2013-06-02 MED ORDER — CEFAZOLIN SODIUM-DEXTROSE 2-3 GM-% IV SOLR
2.0000 g | INTRAVENOUS | Status: AC
  Administered 2013-06-02: 2 g via INTRAVENOUS

## 2013-06-02 MED ORDER — OXYCODONE HCL 5 MG/5ML PO SOLN: 5.0000 mg | Freq: Once | ORAL | Status: DC | PRN

## 2013-06-02 MED ORDER — OXYCODONE-ACETAMINOPHEN 5-325 MG PO TABS: ORAL_TABLET | ORAL | Status: DC

## 2013-06-02 MED ORDER — LACTATED RINGERS IV SOLN
INTRAVENOUS | Status: DC
  Administered 2013-06-02 (×2): via INTRAVENOUS

## 2013-06-02 MED ORDER — FENTANYL CITRATE 0.05 MG/ML IJ SOLN
25.0000 ug | INTRAMUSCULAR | Status: DC | PRN
  Administered 2013-06-02 (×2): 50 ug via INTRAVENOUS

## 2013-06-02 MED ORDER — DEXAMETHASONE SODIUM PHOSPHATE 10 MG/ML IJ SOLN
INTRAMUSCULAR | Status: DC | PRN
  Administered 2013-06-02: 10 mg via INTRAVENOUS

## 2013-06-02 MED ORDER — CHLORHEXIDINE GLUCONATE 4 % EX LIQD: 60.0000 mL | Freq: Once | CUTANEOUS | Status: DC

## 2013-06-02 MED ORDER — MIDAZOLAM HCL 5 MG/5ML IJ SOLN
INTRAMUSCULAR | Status: DC | PRN
  Administered 2013-06-02: 1 mg via INTRAVENOUS

## 2013-06-02 MED ORDER — 0.9 % SODIUM CHLORIDE (POUR BTL) OPTIME
TOPICAL | Status: DC | PRN
  Administered 2013-06-02: 300 mL

## 2013-06-02 SURGICAL SUPPLY — 58 items
BANDAGE ELASTIC 3 VELCRO ST LF (GAUZE/BANDAGES/DRESSINGS) ×4 IMPLANT
BANDAGE GAUZE ELAST BULKY 4 IN (GAUZE/BANDAGES/DRESSINGS) ×4 IMPLANT
BLADE MINI RND TIP GREEN BEAV (BLADE) IMPLANT
BLADE SURG 15 STRL LF DISP TIS (BLADE) ×2 IMPLANT
BLADE SURG 15 STRL SS (BLADE) ×4
BNDG CMPR 9X4 STRL LF SNTH (GAUZE/BANDAGES/DRESSINGS) ×1
BNDG ESMARK 4X9 LF (GAUZE/BANDAGES/DRESSINGS) ×2 IMPLANT
CHLORAPREP W/TINT 26ML (MISCELLANEOUS) ×2 IMPLANT
CLOTH BEACON ORANGE TIMEOUT ST (SAFETY) ×2 IMPLANT
CORDS BIPOLAR (ELECTRODE) ×2 IMPLANT
COVER MAYO STAND STRL (DRAPES) ×2 IMPLANT
COVER TABLE BACK 60X90 (DRAPES) ×2 IMPLANT
CUFF TOURNIQUET SINGLE 18IN (TOURNIQUET CUFF) IMPLANT
CUFF TOURNIQUET SINGLE 24IN (TOURNIQUET CUFF) ×2 IMPLANT
DECANTER SPIKE VIAL GLASS SM (MISCELLANEOUS) IMPLANT
DRAPE EXTREMITY T 121X128X90 (DRAPE) ×2 IMPLANT
DRAPE SURG 17X23 STRL (DRAPES) ×2 IMPLANT
DRSG PAD ABDOMINAL 8X10 ST (GAUZE/BANDAGES/DRESSINGS) ×2 IMPLANT
GAUZE SPONGE 4X4 16PLY XRAY LF (GAUZE/BANDAGES/DRESSINGS) IMPLANT
GAUZE XEROFORM 1X8 LF (GAUZE/BANDAGES/DRESSINGS) ×2 IMPLANT
GLOVE BIO SURGEON STRL SZ7 (GLOVE) ×2 IMPLANT
GLOVE BIO SURGEON STRL SZ7.5 (GLOVE) ×2 IMPLANT
GLOVE BIOGEL PI IND STRL 7.5 (GLOVE) ×1 IMPLANT
GLOVE BIOGEL PI IND STRL 8 (GLOVE) ×1 IMPLANT
GLOVE BIOGEL PI IND STRL 8.5 (GLOVE) ×1 IMPLANT
GLOVE BIOGEL PI INDICATOR 7.5 (GLOVE) ×1
GLOVE BIOGEL PI INDICATOR 8 (GLOVE) ×1
GLOVE BIOGEL PI INDICATOR 8.5 (GLOVE) ×1
GLOVE EXAM NITRILE MD LF STRL (GLOVE) ×2 IMPLANT
GLOVE SURG ORTHO 8.0 STRL STRW (GLOVE) ×2 IMPLANT
GOWN BRE IMP PREV XXLGXLNG (GOWN DISPOSABLE) ×4 IMPLANT
GOWN PREVENTION PLUS XLARGE (GOWN DISPOSABLE) ×2 IMPLANT
LOOP VESSEL MAXI BLUE (MISCELLANEOUS) IMPLANT
NDL SUT 6 .5 CRC .975X.05 MAYO (NEEDLE) IMPLANT
NEEDLE HYPO 25X1 1.5 SAFETY (NEEDLE) ×2 IMPLANT
NEEDLE MAYO TAPER (NEEDLE)
NS IRRIG 1000ML POUR BTL (IV SOLUTION) ×2 IMPLANT
PACK BASIN DAY SURGERY FS (CUSTOM PROCEDURE TRAY) ×2 IMPLANT
PAD CAST 3X4 CTTN HI CHSV (CAST SUPPLIES) ×1 IMPLANT
PAD CAST 4YDX4 CTTN HI CHSV (CAST SUPPLIES) ×1 IMPLANT
PADDING CAST ABS 4INX4YD NS (CAST SUPPLIES) ×1
PADDING CAST ABS COTTON 4X4 ST (CAST SUPPLIES) ×1 IMPLANT
PADDING CAST COTTON 3X4 STRL (CAST SUPPLIES) ×2
PADDING CAST COTTON 4X4 STRL (CAST SUPPLIES) ×2
SLEEVE SCD COMPRESS KNEE MED (MISCELLANEOUS) IMPLANT
SLING ARM FOAM STRAP XLG (SOFTGOODS) ×2 IMPLANT
SPLINT PLASTER CAST XFAST 3X15 (CAST SUPPLIES) ×10 IMPLANT
SPLINT PLASTER XTRA FASTSET 3X (CAST SUPPLIES) ×10
SPONGE GAUZE 4X4 12PLY (GAUZE/BANDAGES/DRESSINGS) ×2 IMPLANT
STOCKINETTE 4X48 STRL (DRAPES) ×2 IMPLANT
SUT ETHILON 4 0 PS 2 18 (SUTURE) ×4 IMPLANT
SUT VIC AB 2-0 SH 27 (SUTURE) ×2
SUT VIC AB 2-0 SH 27XBRD (SUTURE) ×1 IMPLANT
SUT VICRYL 4-0 PS2 18IN ABS (SUTURE) IMPLANT
SYR BULB 3OZ (MISCELLANEOUS) ×2 IMPLANT
SYR CONTROL 10ML LL (SYRINGE) ×2 IMPLANT
TOWEL OR 17X24 6PK STRL BLUE (TOWEL DISPOSABLE) ×4 IMPLANT
UNDERPAD 30X30 INCONTINENT (UNDERPADS AND DIAPERS) ×2 IMPLANT

## 2013-06-02 NOTE — Brief Op Note (Signed)
06/02/2013  10:54 AM  PATIENT:  [name]  67 y.o. male  PRE-OPERATIVE DIAGNOSIS:  LEFT CARPAL TUNNEL SYNDROME AND CUBITAL TUNNEL SYNDROME  POST-OPERATIVE DIAGNOSIS:  LEFT CARPAL TUNNEL SYNDROME AND CUBITAL TUNNEL SYNDROME  PROCEDURE:  Procedure(s): CARPAL TUNNEL WITH CUBITAL TUNNEL  SURGEON:  Surgeon(s): Tami Ribas, MD Nicki Reaper, MD  PHYSICIAN ASSISTANT:   ASSISTANTS: Cindee Salt, MD   ANESTHESIA:   general  EBL:  Total I/O In: 1000 [I.V.:1000] Out: -   DRAINS: none   LOCAL MEDICATIONS USED:  MARCAINE     SPECIMEN:  No Specimen  DISPOSITION OF SPECIMEN:  N/A  COUNTS:  YES  TOURNIQUET:   Total Tourniquet Time Documented: Upper Arm (Left) - 50 minutes Total: Upper Arm (Left) - 50 minutes   DICTATION: .Other Dictation: Dictation Number [id number]  PLAN OF CARE: Discharge to home after PACU

## 2013-06-02 NOTE — H&P (Signed)
Joshua [name] is an 67 y.o. male.   Chief Complaint: left carpal tunnel and cubital tunnel syndromes HPI: 67 yo rhd male with pins and needles sensations in fingers.  Symptoms with driving, issues with dropping and dexterity.  Nocturnal symptoms.  Positive nerve conduction studies.  Past Medical History  Diagnosis Date  . Colon cancer   . Myasthenia gravis   . Diabetes   . Hypertension   . Hyperlipidemia   . Morbid obesity   . Anemia   . Diverticulosis   . Hemorrhoids   . Colon polyps   . DJD (degenerative joint disease)   . Anxiety   . Arthritis   . Basal cell carcinoma   . Myasthenia gravis without exacerbation 05/19/2013  . Vitamin B12 deficiency   . Carpal tunnel syndrome, bilateral     Past Surgical History  Procedure Laterality Date  . Right colectomy  2006  . Patella fracture surgery Left 2011  . Ankle arthroplasty Right 1969  . Tonsillectomy  1968  . Basal cell carcinoma excision      face    Family History  Problem Relation Age of Onset  . Prostate cancer Maternal Grandfather   . Colon polyps Maternal Grandfather   . Heart disease Mother   . Colon cancer Neg Hx    Social History:  reports that he has been smoking Cigars.  He has never used smokeless tobacco. He reports that  drinks alcohol. He reports that he does not use illicit drugs.  Allergies: No Known Allergies  Medications Prior to Admission  Medication Dose Route Frequency Provider Last Rate Last Dose  . cyanocobalamin ((VITAMIN B-12)) injection 1,000 mcg  1,000 mcg Intramuscular Q30 days York Spaniel, MD   1,000 mcg at 05/19/13 0845   Medications Prior to Admission  Medication Sig Dispense Refill  . atorvastatin (LIPITOR) 40 MG tablet Take 40 mg by mouth daily.      . busPIRone (BUSPAR) 7.5 MG tablet ONE TWICE DAILY AS NEEDED ANXIETY  180 tablet  1  . DEXILANT 60 MG capsule Take 60 mg by mouth daily.       . insulin aspart protamine-insulin aspart (NOVOLOG 70/30) (70-30) 100 UNIT/ML injection  Inject 48 Units into the skin 2 (two) times daily with a meal.      . INVOKANA 300 MG TABS Take 1 tablet by mouth daily.       . metFORMIN (GLUCOPHAGE) 1000 MG tablet Take 1,000 mg by mouth daily with breakfast.       . Multiple Vitamins-Minerals (OCUVITE PO) Take 1 tablet by mouth daily.      . mycophenolate (CELLCEPT) 500 MG tablet TAKE 2 TABLETS TWICE A    DAY  360 tablet  0  . ONE TOUCH ULTRA TEST test strip 200 each by Other route 3 (three) times daily.      Letta Pate DELICA LANCETS 33G MISC 100 each by Other route 3 (three) times daily.      . predniSONE (DELTASONE) 5 MG tablet Take one tablet po every other day alternating with one and one half tablet po every other day  135 tablet  1  . pyridostigmine (MESTINON) 60 MG tablet 60 mg 3 (three) times daily. half      . traMADol (ULTRAM) 50 MG tablet Take 100 mg by mouth 3 (three) times daily.       . B-D ULTRAFINE III SHORT PEN 31G X 8 MM MISC 200 each by Other route 3 (three) times  daily between meals.      . Cyanocobalamin (VITAMIN B-12 IJ) Inject 1,000 mcg as directed every 30 (thirty) days.        Results for orders placed during the hospital encounter of 06/02/13 (from the past 48 hour(s))  GLUCOSE, CAPILLARY     Status: Abnormal   Collection Time    06/02/13  7:19 AM      Result Value Range   Glucose-Capillary 123 (*) 70 - 99 mg/dL    No results found.   A comprehensive review of systems was negative except for: Constitutional: positive for weight loss Eyes: positive for contacts/glasses Ears, nose, mouth, throat, and face: positive for hearing loss and tinnitus Respiratory: positive for shortness of breath Gastrointestinal: positive for blood in stool Integument/breast: positive for rash Hematologic/lymphatic: positive for bleeding and easy bruising  Blood pressure 154/78, pulse 75, temperature 97.8 F (36.6 C), temperature source Oral, resp. rate 20, height 5\' 8"  (1.727 m), weight 271 lb (122.925 kg), SpO2  97.00%.  General appearance: alert, cooperative and appears stated age Head: Normocephalic, without obvious abnormality, atraumatic Neck: supple, symmetrical, trachea midline Resp: clear to auscultation bilaterally Cardio: regular rate and rhythm GI: non tender Extremities: intact sensation and capillary refill all digits. +epl/fpl/io.  positive phalens and durkins test.  skin intact Pulses: 2+ and symmetric Skin: Skin color, texture, turgor normal. No rashes or lesions Neurologic: Grossly normal Incision/Wound: na  Assessment/Plan Left carpal tunnel and cubital tunnel syndromes.  Non operative and operative treatment options were discussed with the patient and patient wishes to proceed with operative treatment. Risks, benefits, and alternatives of surgery were discussed and the patient agrees with the plan of care.   Ziya Coonrod R 06/02/2013, 9:22 AM

## 2013-06-02 NOTE — Op Note (Signed)
[id number] 

## 2013-06-02 NOTE — Anesthesia Procedure Notes (Signed)
Procedure Name: LMA Insertion Date/Time: 06/02/2013 9:46 AM Performed by: Zenia Resides D Pre-anesthesia Checklist: Patient identified, Emergency Drugs available, Suction available and Patient being monitored Patient Re-evaluated:Patient Re-evaluated prior to inductionOxygen Delivery Method: Circle System Utilized Preoxygenation: Pre-oxygenation with 100% oxygen Intubation Type: IV induction Ventilation: Mask ventilation without difficulty LMA: LMA inserted LMA Size: 5.0 Number of attempts: 1 Airway Equipment and Method: bite block Placement Confirmation: positive ETCO2 Tube secured with: Tape Dental Injury: Teeth and Oropharynx as per pre-operative assessment

## 2013-06-02 NOTE — Anesthesia Preprocedure Evaluation (Addendum)
Anesthesia Evaluation  Patient identified by MRN, date of birth, ID band Patient awake    Reviewed: Allergy & Precautions, H&P , NPO status , Patient's Chart, lab work & pertinent test results  History of Anesthesia Complications Negative for: history of anesthetic complications  Airway Mallampati: II TM Distance: >3 FB Neck ROM: Full    Dental  (+) Poor Dentition and Dental Advisory Given   Pulmonary Current Smoker,  breath sounds clear to auscultation  Pulmonary exam normal       Cardiovascular hypertension, Pt. on medications Rhythm:Regular Rate:Normal     Neuro/Psych Anxiety  Neuromuscular disease (myasthenia gravis, on mestinon and prednisone)    GI/Hepatic Neg liver ROS, GERD-  Medicated and Controlled,H/o colon cancer   Endo/Other  diabetes (glu 123), Well Controlled, Type 2, Oral Hypoglycemic Agents and Insulin DependentMorbid obesity  Renal/GU negative Renal ROS     Musculoskeletal   Abdominal (+) + obese,   Peds  Hematology  (+) Blood dyscrasia (on cell cept), anemia ,   Anesthesia Other Findings   Reproductive/Obstetrics                          Anesthesia Physical Anesthesia Plan  ASA: III  Anesthesia Plan: General   Post-op Pain Management:    Induction: Intravenous  Airway Management Planned: LMA  Additional Equipment:   Intra-op Plan:   Post-operative Plan:   Informed Consent: I have reviewed the patients History and Physical, chart, labs and discussed the procedure including the risks, benefits and alternatives for the proposed anesthesia with the patient or authorized representative who has indicated his/her understanding and acceptance.   Dental advisory given  Plan Discussed with: CRNA and Surgeon  Anesthesia Plan Comments: (Plan routine monitors, GA- LMA OK)        Anesthesia Quick Evaluation

## 2013-06-02 NOTE — Anesthesia Postprocedure Evaluation (Signed)
  Anesthesia Post-op Note  Patient: Joshua [name]  Procedure(s) Performed: Procedure(s): CARPAL TUNNEL WITH CUBITAL TUNNEL (Left)  Patient Location: PACU  Anesthesia Type:General  Level of Consciousness: awake, alert , oriented and patient cooperative  Airway and Oxygen Therapy: Patient Spontanous Breathing  Post-op Pain: none  Post-op Assessment: Post-op Vital signs reviewed, Patient's Cardiovascular Status Stable, Respiratory Function Stable, Patent Airway, No signs of Nausea or vomiting and Pain level controlled  Post-op Vital Signs: Reviewed and stable  Complications: No apparent anesthesia complications

## 2013-06-02 NOTE — Transfer of Care (Signed)
Immediate Anesthesia Transfer of Care Note  Patient: Joshua [name]  Procedure(s) Performed: Procedure(s): CARPAL TUNNEL WITH CUBITAL TUNNEL (Left)  Patient Location: PACU  Anesthesia Type:General  Level of Consciousness: awake, alert  and oriented  Airway & Oxygen Therapy: Patient Spontanous Breathing and Patient connected to face mask oxygen  Post-op Assessment: Report given to PACU RN and Post -op Vital signs reviewed and stable  Post vital signs: Reviewed and stable  Complications: No apparent anesthesia complications

## 2013-06-03 ENCOUNTER — Encounter (HOSPITAL_BASED_OUTPATIENT_CLINIC_OR_DEPARTMENT_OTHER): Payer: Self-pay | Admitting: Orthopedic Surgery

## 2013-06-03 NOTE — Op Note (Signed)
NAMEDelmon Andrada, Cato                 ACCOUNT NO.:  [id number]  MEDICAL RECORD NO.:  [id number]  LOCATION:                                 FACILITY:  PHYSICIAN:  Betha Loa, MD             DATE OF BIRTH:  DATE OF PROCEDURE:  06/02/2013 DATE OF DISCHARGE:                              OPERATIVE REPORT   PREOPERATIVE DIAGNOSIS:  Left carpal tunnel cubital tunnel syndrome.  POSTOPERATIVE DIAGNOSIS:  Left carpal tunnel cubital tunnel syndrome.  PROCEDURE:  Left carpal tunnel release and left cubital tunnel release.  SURGEON:  Betha Loa, MD  ASSISTANT:  Cindee Salt, MD  ANESTHESIA:  General.  IV FLUIDS:  Per anesthesia flow sheet.  ESTIMATED BLOOD LOSS:  Minimal.  COMPLICATIONS:  None.  SPECIMENS:  None.  TOURNIQUET TIME:  50 minutes.  DISPOSITION:  Stable to PACU.  INDICATIONS:  Mr. Swigart is a 67 year old male with pins and needle sensation in the left upper extremity.  He has symptoms when he drives. He drops things  And has issues with dexterity.  He has positive nerve conduction studies for both carpal tunnel and cubital tunnel syndromes.  I discussed with Mr. Hulsebus the nature of the conditions.  He wished to have carpal tunnel release and cubital tunnel release for management of his symptoms.  Risks, benefits, and alternatives of surgery were discussed including risk of blood loss, infection, damage to nerves, vessels, tendons, ligaments, bone, failure of surgery, need for additional surgery, complications with wound healing, continued pain, continued numbness, potential need for nerve transposition.  He voiced understanding of these risks and elected to proceed.  OPERATIVE COURSE:  After being identified preoperatively by myself, the patient and I agreed upon procedure and site of procedure.  Surgical site was marked.  The risks, benefits, and alternatives of surgery were reviewed and he wished to proceed.  Surgical consent had been signed. He was given IV Ancef  as preoperative antibiotic prophylaxis.  He was transferred to the operating room and placed on the operating room table in supine position with left upper extremity on arm board.  General anesthesia was induced by anesthesiologist.  Left upper extremity was prepped and draped in normal sterile orthopedic fashion.  Surgical pause was performed between surgeons, anesthesia, operating room staff, and all were in agreement as to the patient, procedure, and site of procedure.  Tourniquet at the proximal aspect of the extremity was inflated to 250 mmHg after exsanguination of the limb with an Esmarch bandage.  The cubital tunnel was addressed first.  An incision was made at the medial epicondyle.  This was carried down to the subcutaneous tissues by spreading technique.  The ulnar nerve was identified at the medial epicondyle.  It was released distally.  Care was taken to ensure complete decompression.  The fascia over the FCU and the intermuscular fascia was split.  The finger was placed into the wound to ensure complete decompression as was the case.  The nerve was then decompressed proximally.  The fascia was split.  The finger was placed into the wound to ensure complete decompression.  The elbow was placed through a  range of motion.  The nerve rode up on the medial epicondyle but did not subluxate out.  The wound was copiously irrigated with sterile saline. The volar leaflet of Osbornes ligament was sutured to the posterior subcutaneous tissues.  Care was taken to ensure there was no compression of the nerve.  This was done using 2-0 Vicryl in a figure-of-eight fashion.  The 2-0 Vicryl was used in an interrupted fashion in the subcutaneous tissues.  The skin was closed with 4-0 nylon in a horizontal mattress fashion.  Attention was turned to the carpal tunnel. Incision was made over the transverse carpal ligament and carried to subcutaneous tissues by spreading technique.  Bipolar  electrocautery was used to obtain hemostasis.  The fascia was split.  The transverse carpal ligament was identified and was sharply incised distally.  Care was taken to ensure complete decompression distally.  It was then incised proximally.  The scissors were used to split the distal aspect of the volar antebrachial fascia.  The finger was placed into the wound to ensure complete decompression which was the case.  The nerve was inspected.  It was flattened.  The motor branch was identified and was intact.  The wound was copiously irrigated with sterile saline and was closed with 4-0 nylon in a horizontal mattress fashion.  Both wounds were injected with a total of 10 mL and 0.25% plain Marcaine to aid in postoperative analgesia.  They were then dressed with sterile Xeroform, 4 x 4's, and ABD and wrapped with Kerlix.  A posterior splint with the elbow at 30 degrees was placed and wrapped with Kerlix and Ace bandage. Tourniquet was deflated at 50 minutes.  Fingertips were pink with brisk capillary refill after deflation of the tourniquet.  Operative drapes were broken down.  The patient was awoken from anesthesia safely.  He was transferred back to the stretcher and taken to PACU in stable condition.  I will see him back in the office in 1 week for postoperative followup.  I will give him Percocet 5/325, 1-2 p.o. q.6 hours p.r.n. pain, dispensed #30.     Betha Loa, MD     KK/MEDQ  D:  06/02/2013  T:  06/02/2013  Job:  [id number]

## 2013-06-18 ENCOUNTER — Ambulatory Visit (INDEPENDENT_AMBULATORY_CARE_PROVIDER_SITE_OTHER): Payer: 59 | Admitting: *Deleted

## 2013-06-18 DIAGNOSIS — E538 Deficiency of other specified B group vitamins: Secondary | ICD-10-CM

## 2013-06-19 NOTE — Progress Notes (Signed)
Pt here for B 12 injection.  Under aseptic technique cyanocobalamin 1000mcg/1ml IM given R deltoid.  Tolerated well.  Bandaid applied.  

## 2013-06-19 NOTE — Patient Instructions (Signed)
Pt to check and will schedule for next month.

## 2013-06-23 ENCOUNTER — Other Ambulatory Visit (HOSPITAL_COMMUNITY): Payer: Self-pay | Admitting: Neurology

## 2013-06-29 ENCOUNTER — Other Ambulatory Visit: Payer: Self-pay | Admitting: Orthopedic Surgery

## 2013-07-09 ENCOUNTER — Encounter (HOSPITAL_BASED_OUTPATIENT_CLINIC_OR_DEPARTMENT_OTHER): Payer: Self-pay | Admitting: *Deleted

## 2013-07-09 ENCOUNTER — Encounter (HOSPITAL_BASED_OUTPATIENT_CLINIC_OR_DEPARTMENT_OTHER)
Admission: RE | Admit: 2013-07-09 | Discharge: 2013-07-09 | Disposition: A | Discharge status: Home / Self Care | Payer: 59 | Source: Ambulatory Visit | Attending: Orthopedic Surgery | Admitting: Orthopedic Surgery

## 2013-07-09 DIAGNOSIS — Z01812 Encounter for preprocedural laboratory examination: Secondary | ICD-10-CM | POA: Insufficient documentation

## 2013-07-09 LAB — BASIC METABOLIC PANEL
BUN: 17 mg/dL (ref 6–23)
Calcium: 8.9 mg/dL (ref 8.4–10.5)
Chloride: 104 mEq/L (ref 96–112)
Creatinine, Ser: 1.18 mg/dL (ref 0.50–1.35)
GFR calc non Af Amer: 63 mL/min — ABNORMAL LOW (ref >90)
Glucose, Bld: 143 mg/dL — ABNORMAL HIGH (ref 70–99)

## 2013-07-09 NOTE — Progress Notes (Signed)
Will come in for bmet Was here 9/14 for lt ctr-did well

## 2013-07-14 ENCOUNTER — Encounter (HOSPITAL_BASED_OUTPATIENT_CLINIC_OR_DEPARTMENT_OTHER)
Admission: RE | Disposition: A | Discharge status: Home / Self Care | Payer: Self-pay | Source: Ambulatory Visit | Attending: Orthopedic Surgery

## 2013-07-14 ENCOUNTER — Encounter (HOSPITAL_BASED_OUTPATIENT_CLINIC_OR_DEPARTMENT_OTHER): Payer: Self-pay | Admitting: Certified Registered Nurse Anesthetist

## 2013-07-14 ENCOUNTER — Ambulatory Visit (HOSPITAL_BASED_OUTPATIENT_CLINIC_OR_DEPARTMENT_OTHER): Payer: 59 | Admitting: Certified Registered Nurse Anesthetist

## 2013-07-14 ENCOUNTER — Encounter (HOSPITAL_BASED_OUTPATIENT_CLINIC_OR_DEPARTMENT_OTHER): Payer: 59 | Admitting: Certified Registered Nurse Anesthetist

## 2013-07-14 ENCOUNTER — Ambulatory Visit (HOSPITAL_BASED_OUTPATIENT_CLINIC_OR_DEPARTMENT_OTHER)
Admission: RE | Admit: 2013-07-14 | Discharge: 2013-07-14 | Disposition: A | Discharge status: Home / Self Care | Payer: 59 | Source: Ambulatory Visit | Attending: Orthopedic Surgery | Admitting: Orthopedic Surgery

## 2013-07-14 DIAGNOSIS — Z85038 Personal history of other malignant neoplasm of large intestine: Secondary | ICD-10-CM | POA: Insufficient documentation

## 2013-07-14 DIAGNOSIS — G5600 Carpal tunnel syndrome, unspecified upper limb: Secondary | ICD-10-CM | POA: Insufficient documentation

## 2013-07-14 DIAGNOSIS — M129 Arthropathy, unspecified: Secondary | ICD-10-CM | POA: Insufficient documentation

## 2013-07-14 DIAGNOSIS — M1990 Unspecified osteoarthritis, unspecified site: Secondary | ICD-10-CM | POA: Insufficient documentation

## 2013-07-14 DIAGNOSIS — IMO0002 Reserved for concepts with insufficient information to code with codable children: Secondary | ICD-10-CM | POA: Insufficient documentation

## 2013-07-14 DIAGNOSIS — Z794 Long term (current) use of insulin: Secondary | ICD-10-CM | POA: Insufficient documentation

## 2013-07-14 DIAGNOSIS — I10 Essential (primary) hypertension: Secondary | ICD-10-CM | POA: Insufficient documentation

## 2013-07-14 DIAGNOSIS — Z860100 Personal history of colon polyps, unspecified: Secondary | ICD-10-CM | POA: Insufficient documentation

## 2013-07-14 DIAGNOSIS — E119 Type 2 diabetes mellitus without complications: Secondary | ICD-10-CM | POA: Insufficient documentation

## 2013-07-14 DIAGNOSIS — E785 Hyperlipidemia, unspecified: Secondary | ICD-10-CM | POA: Insufficient documentation

## 2013-07-14 DIAGNOSIS — G7000 Myasthenia gravis without (acute) exacerbation: Secondary | ICD-10-CM | POA: Insufficient documentation

## 2013-07-14 DIAGNOSIS — D649 Anemia, unspecified: Secondary | ICD-10-CM | POA: Insufficient documentation

## 2013-07-14 DIAGNOSIS — Z79899 Other long term (current) drug therapy: Secondary | ICD-10-CM | POA: Insufficient documentation

## 2013-07-14 DIAGNOSIS — G5620 Lesion of ulnar nerve, unspecified upper limb: Secondary | ICD-10-CM | POA: Insufficient documentation

## 2013-07-14 DIAGNOSIS — K5730 Diverticulosis of large intestine without perforation or abscess without bleeding: Secondary | ICD-10-CM | POA: Insufficient documentation

## 2013-07-14 DIAGNOSIS — Z01812 Encounter for preprocedural laboratory examination: Secondary | ICD-10-CM | POA: Insufficient documentation

## 2013-07-14 DIAGNOSIS — F411 Generalized anxiety disorder: Secondary | ICD-10-CM | POA: Insufficient documentation

## 2013-07-14 DIAGNOSIS — K649 Unspecified hemorrhoids: Secondary | ICD-10-CM | POA: Insufficient documentation

## 2013-07-14 DIAGNOSIS — E538 Deficiency of other specified B group vitamins: Secondary | ICD-10-CM | POA: Insufficient documentation

## 2013-07-14 DIAGNOSIS — F17200 Nicotine dependence, unspecified, uncomplicated: Secondary | ICD-10-CM | POA: Insufficient documentation

## 2013-07-14 DIAGNOSIS — Z85828 Personal history of other malignant neoplasm of skin: Secondary | ICD-10-CM | POA: Insufficient documentation

## 2013-07-14 HISTORY — PX: CARPAL TUNNEL WITH CUBITAL TUNNEL: SHX5608

## 2013-07-14 LAB — POCT HEMOGLOBIN-HEMACUE: Hemoglobin: 13 g/dL (ref 13.0–17.0)

## 2013-07-14 LAB — GLUCOSE, CAPILLARY
Glucose-Capillary: 184 mg/dL — ABNORMAL HIGH (ref 70–99)
Glucose-Capillary: 194 mg/dL — ABNORMAL HIGH (ref 70–99)

## 2013-07-14 SURGERY — RELEASE, CARPAL TUNNEL AND CUBITAL TUNNEL
Anesthesia: General | Site: Arm Lower | Laterality: Right | Wound class: Clean

## 2013-07-14 MED ORDER — 0.9 % SODIUM CHLORIDE (POUR BTL) OPTIME
TOPICAL | Status: DC | PRN
  Administered 2013-07-14: 100 mL

## 2013-07-14 MED ORDER — OXYCODONE HCL 5 MG/5ML PO SOLN: 5.0000 mg | Freq: Once | ORAL | Status: AC | PRN

## 2013-07-14 MED ORDER — BUPIVACAINE HCL (PF) 0.25 % IJ SOLN
INTRAMUSCULAR | Status: AC
  Filled 2013-07-14: qty 30

## 2013-07-14 MED ORDER — OXYCODONE HCL 5 MG PO TABS
ORAL_TABLET | ORAL | Status: AC
  Filled 2013-07-14: qty 1

## 2013-07-14 MED ORDER — PROPOFOL 10 MG/ML IV BOLUS
INTRAVENOUS | Status: DC | PRN
  Administered 2013-07-14: 200 mg via INTRAVENOUS

## 2013-07-14 MED ORDER — PROMETHAZINE HCL 25 MG/ML IJ SOLN: 6.2500 mg | INTRAMUSCULAR | Status: DC | PRN

## 2013-07-14 MED ORDER — HYDROMORPHONE HCL PF 1 MG/ML IJ SOLN
INTRAMUSCULAR | Status: AC
  Filled 2013-07-14: qty 1

## 2013-07-14 MED ORDER — PROPOFOL 10 MG/ML IV EMUL
INTRAVENOUS | Status: AC
  Filled 2013-07-14: qty 50

## 2013-07-14 MED ORDER — LIDOCAINE HCL (CARDIAC) 20 MG/ML IV SOLN
INTRAVENOUS | Status: DC | PRN
  Administered 2013-07-14: 100 mg via INTRAVENOUS

## 2013-07-14 MED ORDER — MIDAZOLAM HCL 5 MG/5ML IJ SOLN
INTRAMUSCULAR | Status: DC | PRN
  Administered 2013-07-14: 2 mg via INTRAVENOUS

## 2013-07-14 MED ORDER — MIDAZOLAM HCL 2 MG/2ML IJ SOLN: 1.0000 mg | INTRAMUSCULAR | Status: DC | PRN

## 2013-07-14 MED ORDER — FENTANYL CITRATE 0.05 MG/ML IJ SOLN
INTRAMUSCULAR | Status: DC | PRN
  Administered 2013-07-14: 25 ug via INTRAVENOUS
  Administered 2013-07-14: 50 ug via INTRAVENOUS
  Administered 2013-07-14: 25 ug via INTRAVENOUS
  Administered 2013-07-14: 50 ug via INTRAVENOUS

## 2013-07-14 MED ORDER — CEFAZOLIN SODIUM-DEXTROSE 2-3 GM-% IV SOLR
INTRAVENOUS | Status: AC
  Filled 2013-07-14: qty 50

## 2013-07-14 MED ORDER — CEFAZOLIN SODIUM 1-5 GM-% IV SOLN
INTRAVENOUS | Status: AC
  Filled 2013-07-14: qty 50

## 2013-07-14 MED ORDER — LACTATED RINGERS IV SOLN
INTRAVENOUS | Status: DC
  Administered 2013-07-14: 10 mL/h via INTRAVENOUS
  Administered 2013-07-14: 10:00:00 via INTRAVENOUS

## 2013-07-14 MED ORDER — OXYCODONE HCL 5 MG PO TABS
5.0000 mg | ORAL_TABLET | Freq: Once | ORAL | Status: AC | PRN
  Administered 2013-07-14: 5 mg via ORAL

## 2013-07-14 MED ORDER — ONDANSETRON HCL 4 MG/2ML IJ SOLN
INTRAMUSCULAR | Status: DC | PRN
  Administered 2013-07-14: 4 mg via INTRAVENOUS

## 2013-07-14 MED ORDER — HYDROMORPHONE HCL PF 1 MG/ML IJ SOLN
0.2500 mg | INTRAMUSCULAR | Status: DC | PRN
  Administered 2013-07-14 (×3): 0.5 mg via INTRAVENOUS

## 2013-07-14 MED ORDER — CEFAZOLIN SODIUM-DEXTROSE 2-3 GM-% IV SOLR
2.0000 g | INTRAVENOUS | Status: AC
  Administered 2013-07-14: 3 g via INTRAVENOUS

## 2013-07-14 MED ORDER — FENTANYL CITRATE 0.05 MG/ML IJ SOLN: 50.0000 ug | INTRAMUSCULAR | Status: DC | PRN

## 2013-07-14 MED ORDER — OXYCODONE-ACETAMINOPHEN 5-325 MG PO TABS: ORAL_TABLET | ORAL | Status: DC

## 2013-07-14 MED ORDER — CHLORHEXIDINE GLUCONATE 4 % EX LIQD: 60.0000 mL | Freq: Once | CUTANEOUS | Status: DC

## 2013-07-14 MED ORDER — FENTANYL CITRATE 0.05 MG/ML IJ SOLN
INTRAMUSCULAR | Status: AC
  Filled 2013-07-14: qty 6

## 2013-07-14 MED ORDER — BUPIVACAINE HCL (PF) 0.25 % IJ SOLN
INTRAMUSCULAR | Status: DC | PRN
  Administered 2013-07-14: 10 mL

## 2013-07-14 MED ORDER — EPHEDRINE SULFATE 50 MG/ML IJ SOLN
INTRAMUSCULAR | Status: DC | PRN
  Administered 2013-07-14: 10 mg via INTRAVENOUS

## 2013-07-14 MED ORDER — MIDAZOLAM HCL 2 MG/2ML IJ SOLN
INTRAMUSCULAR | Status: AC
  Filled 2013-07-14: qty 2

## 2013-07-14 SURGICAL SUPPLY — 48 items
BANDAGE ELASTIC 3 VELCRO ST LF (GAUZE/BANDAGES/DRESSINGS) ×6 IMPLANT
BANDAGE GAUZE ELAST BULKY 4 IN (GAUZE/BANDAGES/DRESSINGS) ×3 IMPLANT
BLADE MINI RND TIP GREEN BEAV (BLADE) IMPLANT
BLADE SURG 15 STRL LF DISP TIS (BLADE) ×4 IMPLANT
BLADE SURG 15 STRL SS (BLADE) ×6
BLADE SURG ROTATE 9660 (MISCELLANEOUS) ×3 IMPLANT
BNDG CMPR 9X4 STRL LF SNTH (GAUZE/BANDAGES/DRESSINGS) ×2
BNDG ESMARK 4X9 LF (GAUZE/BANDAGES/DRESSINGS) ×3 IMPLANT
CHLORAPREP W/TINT 26ML (MISCELLANEOUS) ×3 IMPLANT
CORDS BIPOLAR (ELECTRODE) ×3 IMPLANT
COVER MAYO STAND STRL (DRAPES) ×3 IMPLANT
COVER TABLE BACK 60X90 (DRAPES) ×3 IMPLANT
CUFF TOURNIQUET SINGLE 18IN (TOURNIQUET CUFF) IMPLANT
CUFF TOURNIQUET SINGLE 24IN (TOURNIQUET CUFF) ×3 IMPLANT
DRAPE EXTREMITY T 121X128X90 (DRAPE) ×3 IMPLANT
DRAPE SURG 17X23 STRL (DRAPES) ×3 IMPLANT
DRSG PAD ABDOMINAL 8X10 ST (GAUZE/BANDAGES/DRESSINGS) ×3 IMPLANT
GAUZE XEROFORM 1X8 LF (GAUZE/BANDAGES/DRESSINGS) ×3 IMPLANT
GLOVE BIO SURGEON STRL SZ7 (GLOVE) ×3 IMPLANT
GLOVE BIO SURGEON STRL SZ7.5 (GLOVE) ×6 IMPLANT
GLOVE BIOGEL PI IND STRL 7.5 (GLOVE) ×2 IMPLANT
GLOVE BIOGEL PI IND STRL 8 (GLOVE) ×2 IMPLANT
GLOVE BIOGEL PI IND STRL 8.5 (GLOVE) ×2 IMPLANT
GLOVE BIOGEL PI INDICATOR 7.5 (GLOVE) ×1
GLOVE BIOGEL PI INDICATOR 8 (GLOVE) ×1
GLOVE BIOGEL PI INDICATOR 8.5 (GLOVE) ×1
GLOVE EXAM NITRILE MD LF STRL (GLOVE) ×3 IMPLANT
GOWN BRE IMP PREV XXLGXLNG (GOWN DISPOSABLE) ×6 IMPLANT
GOWN PREVENTION PLUS XLARGE (GOWN DISPOSABLE) ×3 IMPLANT
NEEDLE HYPO 25X1 1.5 SAFETY (NEEDLE) IMPLANT
NS IRRIG 1000ML POUR BTL (IV SOLUTION) ×3 IMPLANT
PACK BASIN DAY SURGERY FS (CUSTOM PROCEDURE TRAY) ×3 IMPLANT
PAD CAST 4YDX4 CTTN HI CHSV (CAST SUPPLIES) ×2 IMPLANT
PADDING CAST ABS 4INX4YD NS (CAST SUPPLIES) ×1
PADDING CAST ABS COTTON 4X4 ST (CAST SUPPLIES) ×2 IMPLANT
PADDING CAST COTTON 4X4 STRL (CAST SUPPLIES) ×3
SLEEVE SCD COMPRESS KNEE MED (MISCELLANEOUS) ×3 IMPLANT
SLING ARM FOAM STRAP XLG (SOFTGOODS) ×3 IMPLANT
SPLINT PLASTER CAST XFAST 3X15 (CAST SUPPLIES) ×20 IMPLANT
SPLINT PLASTER XTRA FASTSET 3X (CAST SUPPLIES) ×10
SPONGE GAUZE 4X4 12PLY (GAUZE/BANDAGES/DRESSINGS) ×3 IMPLANT
STOCKINETTE 4X48 STRL (DRAPES) ×3 IMPLANT
SUT ETHILON 4 0 PS 2 18 (SUTURE) ×6 IMPLANT
SUT VICRYL 4-0 PS2 18IN ABS (SUTURE) ×3 IMPLANT
SYR BULB 3OZ (MISCELLANEOUS) ×3 IMPLANT
SYR CONTROL 10ML LL (SYRINGE) IMPLANT
TOWEL OR 17X24 6PK STRL BLUE (TOWEL DISPOSABLE) ×6 IMPLANT
UNDERPAD 30X30 INCONTINENT (UNDERPADS AND DIAPERS) ×3 IMPLANT

## 2013-07-14 NOTE — Brief Op Note (Signed)
07/14/2013  12:32 PM  PATIENT:  Joshua [name]  67 y.o. male  PRE-OPERATIVE DIAGNOSIS:  right carpal tunnel & cubital tunnel syndrone  POST-OPERATIVE DIAGNOSIS:  right carpal tunnel & cubital tunnel syndrone  PROCEDURE:  Procedure(s): RIGHT CARPAL TUNNEL AND CUBITAL TUNNEL RELEASE  SURGEON:  Surgeon(s): Tami Ribas, MD Nicki Reaper, MD  PHYSICIAN ASSISTANT:   ASSISTANTS: Cindee Salt, MD   ANESTHESIA:   general  EBL:  Total I/O In: 800 [I.V.:800] Out: -   DRAINS: none   LOCAL MEDICATIONS USED:  MARCAINE     SPECIMEN:  No Specimen  DISPOSITION OF SPECIMEN:  N/A  COUNTS:  YES  TOURNIQUET:   Total Tourniquet Time Documented: Upper Arm (Right) - 43 minutes Total: Upper Arm (Right) - 43 minutes   DICTATION: .Other Dictation: Dictation Number [id number]  PLAN OF CARE: Discharge to home after PACU

## 2013-07-14 NOTE — Op Note (Signed)
[id number] 

## 2013-07-14 NOTE — Anesthesia Procedure Notes (Signed)
Procedure Name: LMA Insertion Date/Time: 07/14/2013 11:31 AM Performed by: Caren Macadam Pre-anesthesia Checklist: Patient identified, Emergency Drugs available, Suction available and Patient being monitored Patient Re-evaluated:Patient Re-evaluated prior to inductionOxygen Delivery Method: Circle System Utilized Preoxygenation: Pre-oxygenation with 100% oxygen Intubation Type: IV induction Ventilation: Mask ventilation without difficulty LMA: LMA inserted LMA Size: 5.0 Number of attempts: 1 Airway Equipment and Method: bite block Placement Confirmation: positive ETCO2 and breath sounds checked- equal and bilateral Tube secured with: Tape Dental Injury: Teeth and Oropharynx as per pre-operative assessment

## 2013-07-14 NOTE — Anesthesia Preprocedure Evaluation (Signed)
Anesthesia Evaluation  Patient identified by MRN, date of birth, ID band Patient awake    Reviewed: Allergy & Precautions, H&P , NPO status , Patient's Chart, lab work & pertinent test results  History of Anesthesia Complications Negative for: history of anesthetic complications  Airway Mallampati: II TM Distance: >3 FB Neck ROM: Full    Dental  (+) Poor Dentition and Dental Advisory Given   Pulmonary Current Smoker,  breath sounds clear to auscultation  Pulmonary exam normal       Cardiovascular hypertension, Pt. on medications Rhythm:Regular Rate:Normal     Neuro/Psych  Neuromuscular disease (myasthenia gravis, on mestinon and prednisone)    GI/Hepatic Neg liver ROS, GERD-  Medicated and Controlled,H/o colon cancer   Endo/Other  diabetes (glu 123), Well Controlled, Type 2, Oral Hypoglycemic Agents and Insulin DependentMorbid obesity  Renal/GU      Musculoskeletal   Abdominal (+) + obese,   Peds  Hematology  (+) Blood dyscrasia (on cell cept), anemia ,   Anesthesia Other Findings   Reproductive/Obstetrics                           Anesthesia Physical Anesthesia Plan  ASA: III  Anesthesia Plan: General   Post-op Pain Management:    Induction: Intravenous  Airway Management Planned: LMA  Additional Equipment:   Intra-op Plan:   Post-operative Plan: Extubation in OR  Informed Consent: I have reviewed the patients History and Physical, chart, labs and discussed the procedure including the risks, benefits and alternatives for the proposed anesthesia with the patient or authorized representative who has indicated his/her understanding and acceptance.     Plan Discussed with: CRNA and Surgeon  Anesthesia Plan Comments:         Anesthesia Quick Evaluation

## 2013-07-14 NOTE — H&P (Signed)
Joshua [name] is an 67 y.o. male.   Chief Complaint: right carpal/cubital tunnel syndrome HPI: 67 yo rhd male with pins and needles sensation in fingers x 2 years.  Nocturnal symptoms.  Positive nerve conduction studies for carpal and cubital tunnel syndromes.  Has had left carpal and cubital tunnel release with good result.  He wishes to have right carpal and cubital tunnel release.  Past Medical History  Diagnosis Date  . Colon cancer   . Myasthenia gravis   . Diabetes   . Hypertension   . Hyperlipidemia   . Morbid obesity   . Anemia   . Diverticulosis   . Hemorrhoids   . Colon polyps   . DJD (degenerative joint disease)   . Anxiety   . Arthritis   . Basal cell carcinoma   . Myasthenia gravis without exacerbation 05/19/2013  . Vitamin B12 deficiency   . Carpal tunnel syndrome, bilateral     Past Surgical History  Procedure Laterality Date  . Right colectomy  2006  . Patella fracture surgery Left 2011  . Ankle arthroplasty Right 1969  . Tonsillectomy  1968  . Basal cell carcinoma excision      face  . Carpal tunnel with cubital tunnel Left 06/02/2013    Procedure: CARPAL TUNNEL WITH CUBITAL TUNNEL;  Surgeon: Tami Ribas, MD;  Location: [facility];  Service: Orthopedics;  Laterality: Left;    Family History  Problem Relation Age of Onset  . Prostate cancer Maternal Grandfather   . Colon polyps Maternal Grandfather   . Heart disease Mother   . Colon cancer Neg Hx    Social History:  reports that he has been smoking Cigars.  He has never used smokeless tobacco. He reports that he drinks alcohol. He reports that he does not use illicit drugs.  Allergies: No Known Allergies  Medications Prior to Admission  Medication Dose Route Frequency Provider Last Rate Last Dose  . cyanocobalamin ((VITAMIN B-12)) injection 1,000 mcg  1,000 mcg Intramuscular Q30 days York Spaniel, MD   1,000 mcg at 06/18/13 1615   Medications Prior to Admission  Medication Sig  Dispense Refill  . atorvastatin (LIPITOR) 40 MG tablet Take 40 mg by mouth daily.      . busPIRone (BUSPAR) 7.5 MG tablet ONE TWICE DAILY AS NEEDED ANXIETY  180 tablet  1  . Cyanocobalamin (VITAMIN B-12 IJ) Inject 1,000 mcg as directed every 30 (thirty) days.      Marland Kitchen DEXILANT 60 MG capsule Take 60 mg by mouth daily.       . insulin aspart protamine-insulin aspart (NOVOLOG 70/30) (70-30) 100 UNIT/ML injection Inject 48 Units into the skin 2 (two) times daily with a meal.      . INVOKANA 300 MG TABS Take 1 tablet by mouth daily.       . metFORMIN (GLUCOPHAGE) 1000 MG tablet Take 1,000 mg by mouth daily with breakfast.       . Multiple Vitamins-Minerals (OCUVITE PO) Take 1 tablet by mouth daily.      . mycophenolate (CELLCEPT) 500 MG tablet TAKE 2 TABLETS TWICE A    DAY  360 tablet  1  . oxyCODONE-acetaminophen (PERCOCET) 5-325 MG per tablet 1-2 tabs po q6 hours prn pain  30 tablet  0  . predniSONE (DELTASONE) 5 MG tablet Take one tablet po every other day alternating with one and one half tablet po every other day  135 tablet  1  . pyridostigmine (MESTINON) 60  MG tablet 60 mg 3 (three) times daily. half      . traMADol (ULTRAM) 50 MG tablet Take 100 mg by mouth 3 (three) times daily.       . B-D ULTRAFINE III SHORT PEN 31G X 8 MM MISC 200 each by Other route 3 (three) times daily between meals.      . ONE TOUCH ULTRA TEST test strip 200 each by Other route 3 (three) times daily.      Letta Pate DELICA LANCETS 33G MISC 100 each by Other route 3 (three) times daily.        No results found for this or any previous visit (from the past 48 hour(s)).  No results found.   A comprehensive review of systems was negative except for: Constitutional: positive for weight loss Eyes: positive for contacts/glasses Ears, nose, mouth, throat, and face: positive for hearing loss and tinnitus Respiratory: positive for shortness of breath Gastrointestinal: positive for melena Integument/breast: positive for  rash Hematologic/lymphatic: positive for bleeding and easy bruising  Blood pressure 156/84, pulse 83, temperature 97.6 F (36.4 C), temperature source Oral, resp. rate 22, height 5\' 8"  (1.727 m), weight 270 lb (122.471 kg), SpO2 97.00%.  General appearance: alert, cooperative and appears stated age Head: Normocephalic, without obvious abnormality, atraumatic Neck: supple, symmetrical, trachea midline Resp: clear to auscultation bilaterally Cardio: regular rate and rhythm GI: non tender Extremities: intact sensation and capillary refill all digits.  +epl/fpl/io.  positive tinels, phalens, durkins at carpal tunnel, positive tinels, flexion test at elbow.  no wounds. Pulses: 2+ and symmetric Skin: Skin color, texture, turgor normal. 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  Non operative and operative treatment options were discussed with the patient and patient wishes to proceed with operative treatment. Risks, benefits, and alternatives of surgery were discussed and the patient agrees with the plan of care.   Joshua [name] 07/14/2013, 9:55 AM

## 2013-07-14 NOTE — Transfer of Care (Signed)
Immediate Anesthesia Transfer of Care Note  Patient: Joshua [name]  Procedure(s) Performed: Procedure(s): RIGHT CARPAL TUNNEL AND CUBITAL TUNNEL RELEASE (Right)  Patient Location: PACU  Anesthesia Type:General  Level of Consciousness: awake and patient cooperative  Airway & Oxygen Therapy: Patient Spontanous Breathing and Patient connected to face mask oxygen  Post-op Assessment: Report given to PACU RN and Post -op Vital signs reviewed and stable  Post vital signs: Reviewed and stable  Complications: No apparent anesthesia complications

## 2013-07-14 NOTE — Anesthesia Postprocedure Evaluation (Signed)
  Anesthesia Post-op Note  Patient: Joshua [name]  Procedure(s) Performed: Procedure(s): RIGHT CARPAL TUNNEL AND CUBITAL TUNNEL RELEASE (Right)  Patient Location: PACU  Anesthesia Type:General  Level of Consciousness: awake  Airway and Oxygen Therapy: Patient Spontanous Breathing  Post-op Pain: mild  Post-op Assessment: Post-op Vital signs reviewed  Post-op Vital Signs: stable  Complications: No apparent anesthesia complications

## 2013-07-15 NOTE — Op Note (Signed)
NAMEBoden [name], Joshua [name]                 ACCOUNT NO.:  [id number]  MEDICAL RECORD NO.:  [id number]  LOCATION:                                 FACILITY:  PHYSICIAN:  Betha Loa, MD             DATE OF BIRTH:  DATE OF PROCEDURE:  07/14/2013 DATE OF DISCHARGE:                              OPERATIVE REPORT   PREOPERATIVE DIAGNOSIS:  Right carpal tunnel cubital tunnel syndrome.  POSTOPERATIVE DIAGNOSIS:  Right carpal tunnel cubital tunnel syndrome.  PROCEDURE:  Right carpal tunnel and cubital tunnel release.  SURGEON:  Betha Loa, MD  ASSISTANT:  Cindee Salt, MD  ANESTHESIA:  General.  IV FLUIDS:  Per anesthesia flow sheet.  ESTIMATED BLOOD LOSS:  Minimal.  COMPLICATIONS:  None.  SPECIMENS:  None.  TOURNIQUET TIME:  43 minutes.  DISPOSITION:  Stable to PACU.  INDICATIONS:  Joshua [name] is a 67 year old male who has had pins and needle sensation on the right hand.  He has positive nerve conduction studies.  He has nocturnal symptoms.  He wishes to have a right carpal tunnel and cubital tunnel release for management of symptoms.  He has already had a carpal tunnel and cubital tunnel release on the left side and was very happy with the results.  Risks, benefits and alternatives of surgery were discussed including the risk of blood loss, infection, damage to nerves, vessels, tendons, ligaments, bone, failure of surgery, need for additional surgery, complications with wound healing, continued pain, and continued carpal or cubital tunnel syndrome.  He voiced understanding of these risks and elected to proceed.  OPERATIVE COURSE:  After being identified preoperatively by myself, the patient and I agreed upon procedure and site of procedure.  Surgical site was marked.  Risks, benefits, and alternatives of surgery were reviewed and he wished to proceed.  Surgical consent had been signed. He was given IV Ancef as preoperative antibiotic prophylaxis.  He was transported to the operating  room and placed on the operating room table in supine position with the right upper extremity on arm board.  General anesthesia was induced by anesthesiologist.  Right upper extremity was prepped and draped in normal sterile orthopedic fashion.  A surgical pause was performed between surgeons, anesthesia, operating room staff, and all were in agreement as to the patient, procedure, and site of procedure.  Tourniquet at the proximal aspect of the extremity was inflated to 250 mmHg after exsanguination of the limb with an Esmarch bandage.  An incision was made in the palm over the transverse carpal ligament.  This was carried into subcutaneous tissues by spreading technique.  Bipolar electrocautery was used to obtain hemostasis.  The transverse carpal ligament was identified and was incised sharply in a distal direction.  Care was taken to protect the ulnar nerve and artery. It was decompressed fully distally.  The ligament was then split proximally.  The scissors were used to split the distal aspect of the volar antebrachial fascia.  The nerve was inspected.  It was flat.  The motor branch was identified and was intact.  A finger was placed into the wound to ensure complete decompression which was  the case.  The wound was copiously irrigated with sterile saline.  It was closed with 4- 0 nylon in a horizontal mattress fashion.  Attention was turned to the cubital tunnel.    Incision was made at the medial side of the elbow and carried into subcutaneous tissues by spreading technique.  Bipolar electrocautery was again used to obtain hemostasis.  The nerve was identified at Osborne's ligament.  Osborne's ligament was incised sharply. The nerve was then decompressed distally.  The scissors were used to do so.  Great care was taken to protect the nerve throughout the procedure.  The superficial fascia over the FCU tendon was split.  The interval between the 2 heads of the FCU was created.   Care was taken to protect all nerve branches.  The nerve was decompressed distally.  It was then decompressed proximally.  Again, the superficial fascia was split and the nerve decompressed.  Care was taken to protect the nerve throughout the case.  A finger was placed into the wound to ensure complete decompression which was the case.  The elbow was placed through a range of motion.  The nerve rode up on the medial epicondyle but did not fully dislocate.  The wound was copiously irrigated with sterile saline.  The volar leaflet of Osborne's ligament was sutured to the posterior subcutaneous tissues to prevent subluxation of the nerve. This was done with a 2-0 Vicryl suture.  There was no compression of the  nerve from this sling of tissue. The subcutaneous tissues were closed with 4-0 Vicryl in inverted interrupted fashion and the skin was closed with a 4-0 nylon in a horizontal mattress fashion.  The wounds were injected with a total of 10 mL of 0.25% plain Marcaine to aid in postoperative analgesia.  They were then dressed with sterile Xeroform, 4x4s, ABD and wrapped with a Kerlix.  A posterior splint was placed at the elbow with the elbow in 45-50 degrees of flexion.  This was wrapped with an Ace bandage.  Tourniquet was deflated at 43 minutes.  Fingertips were pink with brisk capillary refill after deflation of tourniquet. Operative drapes were broken down and the patient was awoken from anesthesia safely.  He was transferred back to the stretcher and taken to PACU in stable condition.  I will see him back in the office in 1 week for postoperative followup.  I will give him Percocet 5/325, 1-2 p.o. q.6 hours p.r.n. pain, dispensed #40.     Betha Loa, MD     KK/MEDQ  D:  07/14/2013  T:  07/15/2013  Job:  [id number]

## 2013-07-16 ENCOUNTER — Encounter (HOSPITAL_BASED_OUTPATIENT_CLINIC_OR_DEPARTMENT_OTHER): Payer: Self-pay | Admitting: Orthopedic Surgery

## 2013-07-20 ENCOUNTER — Ambulatory Visit (INDEPENDENT_AMBULATORY_CARE_PROVIDER_SITE_OTHER): Payer: 59 | Admitting: *Deleted

## 2013-07-20 DIAGNOSIS — E538 Deficiency of other specified B group vitamins: Secondary | ICD-10-CM

## 2013-07-20 NOTE — Progress Notes (Signed)
Pt here for B 12 injection.  Under aseptic technique cyanocobalamin 1000mcg/1ml IM given L deltoid.  Tolerated well.  Bandaid applied.  

## 2013-07-20 NOTE — Patient Instructions (Signed)
See next month. 

## 2013-08-10 ENCOUNTER — Other Ambulatory Visit (HOSPITAL_COMMUNITY): Payer: Self-pay | Admitting: Neurology

## 2013-08-16 ENCOUNTER — Other Ambulatory Visit (HOSPITAL_COMMUNITY): Payer: Self-pay | Admitting: Neurology

## 2013-08-19 ENCOUNTER — Encounter (INDEPENDENT_AMBULATORY_CARE_PROVIDER_SITE_OTHER): Payer: Self-pay

## 2013-08-19 ENCOUNTER — Ambulatory Visit (INDEPENDENT_AMBULATORY_CARE_PROVIDER_SITE_OTHER): Payer: 59 | Admitting: *Deleted

## 2013-08-19 DIAGNOSIS — E538 Deficiency of other specified B group vitamins: Secondary | ICD-10-CM

## 2013-08-19 NOTE — Progress Notes (Signed)
Pt here for B 12 injection.  Under aseptic technique cyanocobalamin 1000mcg/1ml IM given L deltoid.  Tolerated well.  Bandaid applied.  

## 2013-08-19 NOTE — Patient Instructions (Signed)
See next month. 

## 2013-08-21 ENCOUNTER — Telehealth: Payer: Self-pay | Admitting: Neurology

## 2013-08-21 DIAGNOSIS — Z5181 Encounter for therapeutic drug level monitoring: Secondary | ICD-10-CM

## 2013-08-21 NOTE — Telephone Encounter (Signed)
I called patient. It is time to come in to get a CBC and liver profile done as he is on CellCept. I discussed this with him. He will come in next week.

## 2013-08-21 NOTE — Telephone Encounter (Signed)
Message copied by Stephanie Acre on Fri Aug 21, 2013  6:16 PM ------      Message from: Stephanie Acre      Created: Tue May 19, 2013  8:40 AM       Recheck CBC and liver profile, patient is on CellCept. ------

## 2013-08-24 ENCOUNTER — Other Ambulatory Visit (INDEPENDENT_AMBULATORY_CARE_PROVIDER_SITE_OTHER): Payer: Self-pay

## 2013-08-24 DIAGNOSIS — Z0289 Encounter for other administrative examinations: Secondary | ICD-10-CM

## 2013-08-24 DIAGNOSIS — Z5181 Encounter for therapeutic drug level monitoring: Secondary | ICD-10-CM

## 2013-08-25 LAB — COMPREHENSIVE METABOLIC PANEL
ALT: 9 IU/L (ref 0–44)
Albumin/Globulin Ratio: 2 (ref 1.1–2.5)
Albumin: 3.8 g/dL (ref 3.6–4.8)
Alkaline Phosphatase: 86 IU/L (ref 39–117)
BUN/Creatinine Ratio: 14 (ref 10–22)
BUN: 14 mg/dL (ref 8–27)
Calcium: 9 mg/dL (ref 8.6–10.2)
GFR calc Af Amer: 90 mL/min/{1.73_m2} (ref >59)
GFR calc non Af Amer: 78 mL/min/{1.73_m2} (ref >59)
Glucose: 222 mg/dL — ABNORMAL HIGH (ref 65–99)
Potassium: 4.5 mmol/L (ref 3.5–5.2)
Total Bilirubin: 0.4 mg/dL (ref 0.0–1.2)
Total Protein: 5.7 g/dL — ABNORMAL LOW (ref 6.0–8.5)

## 2013-08-25 LAB — CBC WITH DIFFERENTIAL
Basophils Absolute: 0 10*3/uL (ref 0.0–0.2)
Eosinophils Absolute: 0 10*3/uL (ref 0.0–0.4)
HCT: 38.1 % (ref 37.5–51.0)
Hemoglobin: 12.8 g/dL (ref 12.6–17.7)
Lymphocytes Absolute: 1.5 10*3/uL (ref 0.7–3.1)
MCHC: 33.6 g/dL (ref 31.5–35.7)
MCV: 84 fL (ref 79–97)
Monocytes: 7 %
Neutrophils Absolute: 5.9 10*3/uL (ref 1.4–7.0)
RDW: 14.4 % (ref 12.3–15.4)
WBC: 7.9 10*3/uL (ref 3.4–10.8)

## 2013-09-21 ENCOUNTER — Ambulatory Visit: Payer: 59

## 2013-09-23 ENCOUNTER — Ambulatory Visit (INDEPENDENT_AMBULATORY_CARE_PROVIDER_SITE_OTHER): Payer: 59 | Admitting: *Deleted

## 2013-09-23 DIAGNOSIS — E538 Deficiency of other specified B group vitamins: Secondary | ICD-10-CM

## 2013-09-23 NOTE — Patient Instructions (Signed)
See next month. 

## 2013-09-23 NOTE — Progress Notes (Signed)
Pt here for B 12 injection.  Under aseptic technique cyanocobalamin 1000mcg/1ml IM given R deltoid.  Tolerated well.  Bandaid applied.  

## 2013-10-20 DIAGNOSIS — H35372 Puckering of macula, left eye: Secondary | ICD-10-CM | POA: Insufficient documentation

## 2013-10-21 ENCOUNTER — Other Ambulatory Visit (HOSPITAL_COMMUNITY): Payer: Self-pay | Admitting: Neurology

## 2013-10-23 ENCOUNTER — Encounter (INDEPENDENT_AMBULATORY_CARE_PROVIDER_SITE_OTHER): Payer: Self-pay

## 2013-10-23 ENCOUNTER — Ambulatory Visit (INDEPENDENT_AMBULATORY_CARE_PROVIDER_SITE_OTHER): Payer: 59 | Admitting: *Deleted

## 2013-10-23 DIAGNOSIS — E538 Deficiency of other specified B group vitamins: Secondary | ICD-10-CM

## 2013-10-23 MED ORDER — CYANOCOBALAMIN 1000 MCG/ML IJ SOLN
1000.0000 ug | INTRAMUSCULAR | Status: DC
  Administered 2013-10-23: 1000 ug via INTRAMUSCULAR

## 2013-10-23 NOTE — Progress Notes (Signed)
Pt here for B 12 injection.  Under aseptic technique cyanocobalamin 1000mcg/1ml IM given L deltoid.  Tolerated well.  Bandaid applied.  

## 2013-10-23 NOTE — Patient Instructions (Signed)
See next month. 

## 2013-11-20 ENCOUNTER — Ambulatory Visit (INDEPENDENT_AMBULATORY_CARE_PROVIDER_SITE_OTHER): Payer: 59 | Admitting: *Deleted

## 2013-11-20 DIAGNOSIS — E538 Deficiency of other specified B group vitamins: Secondary | ICD-10-CM

## 2013-11-20 MED ORDER — CYANOCOBALAMIN 1000 MCG/ML IJ SOLN
1000.0000 ug | INTRAMUSCULAR | Status: DC
  Administered 2013-11-20: 1000 ug via INTRAMUSCULAR

## 2013-11-20 NOTE — Patient Instructions (Signed)
See next month. 

## 2013-11-20 NOTE — Progress Notes (Signed)
Pt here for B 12 injection.  Under aseptic technique cyanocobalamin 1000mcg/1ml IM given L deltoid.  Tolerated well.  Bandaid applied.  

## 2013-11-30 ENCOUNTER — Other Ambulatory Visit (HOSPITAL_COMMUNITY): Payer: Self-pay | Admitting: Neurology

## 2013-11-30 NOTE — Telephone Encounter (Signed)
[facility] patient assigned to Dr Jannifer Franklin.  Dr Erling Cruz had been prescribing this drug.  Patient has appt this month.

## 2013-12-01 ENCOUNTER — Encounter (INDEPENDENT_AMBULATORY_CARE_PROVIDER_SITE_OTHER): Payer: Self-pay

## 2013-12-01 ENCOUNTER — Ambulatory Visit (INDEPENDENT_AMBULATORY_CARE_PROVIDER_SITE_OTHER): Payer: 59 | Admitting: Neurology

## 2013-12-01 ENCOUNTER — Encounter: Payer: Self-pay | Admitting: Neurology

## 2013-12-01 VITALS — BP 131/72 | HR 85 | Wt 269.0 lb

## 2013-12-01 DIAGNOSIS — G7000 Myasthenia gravis without (acute) exacerbation: Secondary | ICD-10-CM

## 2013-12-01 MED ORDER — BUSPIRONE HCL 7.5 MG PO TABS: 7.5000 mg | ORAL_TABLET | Freq: Two times a day (BID) | ORAL | Status: DC

## 2013-12-01 NOTE — Patient Instructions (Signed)
Myasthenia Gravis Myasthenia gravis is a disease that causes muscle weakness throughout the body. The muscles affected are the ones we can control (voluntary muscles). An example of a voluntary muscle is your hand muscles. You can control the muscles to make the hand pick something up. An example of an involuntary muscle is the heart. The heart beats without any direction from you.  Myasthenia Gravis is thought to be an autoimmune disease. That means that normal defenses of the body begin to attack the body. In this case, the immune system begins to attack cells located at the junctions of the muscles and the nerves. Women are affected more often. Women are affected at a younger age than men. Babies born to affected women frequently develop symptoms at an early age. SYMPTOMS Initially in the disease, the facial muscles are affected first. After this, a person may develop droopy eyelids. They may have difficulty controlling facial muscles. They may have problems chewing. Swallowing and speaking may become impaired. The weakness gradually spreads to the arms and legs. It begins to affect breathing. Sometimes, the symptoms lessen or go away without any apparent cause. DIAGNOSIS  Diagnosis can be made with blood tests. Tests such as electromyography may be done to examine the electrical activity in the muscle. An improvement in symptoms after having an anti-cholinesterase drug helps confirm the diagnosis.  TREATMENT  Medicines are usually prescribed as the first treatment. These medicines help, but they do not cure the disease. A plasma cleansing procedure (plasmapheresis) can be used to treat a crisis. It can also be used to prepare a person for surgery. This procedure produces short-term improvement. Some cases are helped by removing the thymus gland. Steroids are used for short-term benefits. Document Released: 12/03/2000 Document Revised: 11/19/2011 Document Reviewed: 08/27/2005 [employer] Patient  Information 2014 [employer].  

## 2013-12-01 NOTE — Progress Notes (Signed)
Reason for visit: Myasthenia gravis  Joshua [name] is an 68 y.o. male  History of present illness:  Joshua [name] is a 68 year old right-handed white male with a history of obesity and myasthenia gravis. The patient initially had swallowing issues with his disease. The patient has done well on CellCept, low-dose prednisone, and mestinon. The patient is having diarrhea on Mestinon, but he cut back to taking one half tablet 3 times daily, and he is doing much better. The patient is on prednisone with the 5 mg tablets, taking 5 mg every other day alternating with 7.5 mg. The patient recently had blood work done in mid-March. This is relatively unremarkable. The patient denies any weakness of the extremities. The patient is not having double vision or significant ptosis. The patient has proptosis of the left eye, and he has some corneal issues associated with this.  Past Medical History  Diagnosis Date  . Colon cancer   . Myasthenia gravis   . Diabetes   . Hypertension   . Hyperlipidemia   . Morbid obesity   . Anemia   . Diverticulosis   . Hemorrhoids   . Colon polyps   . DJD (degenerative joint disease)   . Anxiety   . Arthritis   . Basal cell carcinoma   . Myasthenia gravis without exacerbation 05/19/2013  . Vitamin B12 deficiency   . Carpal tunnel syndrome, bilateral     Past Surgical History  Procedure Laterality Date  . Right colectomy  2006  . Patella fracture surgery Left 2011  . Ankle arthroplasty Right 1969  . Tonsillectomy  1968  . Basal cell carcinoma excision      face  . Carpal tunnel with cubital tunnel Left 06/02/2013    Procedure: CARPAL TUNNEL WITH CUBITAL TUNNEL;  Surgeon: Tennis Must, MD;  Location: [facility];  Service: Orthopedics;  Laterality: Left;  . Carpal tunnel with cubital tunnel Right 07/14/2013    Procedure: RIGHT CARPAL TUNNEL AND CUBITAL TUNNEL RELEASE;  Surgeon: Tennis Must, MD;  Location: [facility];  Service:  Orthopedics;  Laterality: Right;    Family History  Problem Relation Age of Onset  . Prostate cancer Maternal Grandfather   . Colon polyps Maternal Grandfather   . Heart disease Mother   . Colon cancer Neg Hx     Social history:  reports that he has been smoking Cigars.  He has never used smokeless tobacco. He reports that he drinks alcohol. He reports that he does not use illicit drugs.   No Known Allergies  Medications:  Current Outpatient Prescriptions on File Prior to Visit  Medication Sig Dispense Refill  . atorvastatin (LIPITOR) 40 MG tablet Take 40 mg by mouth daily.      . B-D ULTRAFINE III SHORT PEN 31G X 8 MM MISC 200 each by Other route 3 (three) times daily between meals.      . Cyanocobalamin (VITAMIN B-12 IJ) Inject 1,000 mcg as directed every 30 (thirty) days.      Marland Kitchen DEXILANT 60 MG capsule Take 60 mg by mouth daily.       . insulin aspart protamine-insulin aspart (NOVOLOG 70/30) (70-30) 100 UNIT/ML injection Inject 48 Units into the skin 2 (two) times daily with a meal.      . INVOKANA 300 MG TABS Take 1 tablet by mouth daily.       . metFORMIN (GLUCOPHAGE) 1000 MG tablet Take 1,000 mg by mouth daily with breakfast.       .  Multiple Vitamins-Minerals (OCUVITE PO) Take 1 tablet by mouth daily.      . mycophenolate (CELLCEPT) 500 MG tablet TAKE 2 TABLETS BY MOUTH   TWICE A DAY  360 tablet  1  . ONE TOUCH ULTRA TEST test strip 200 each by Other route 3 (three) times daily.      Glory Rosebush DELICA LANCETS 16X MISC 100 each by Other route 3 (three) times daily.      . predniSONE (DELTASONE) 5 MG tablet Take one tablet po every other day alternating with one and one half tablet po every other day  135 tablet  1  . pyridostigmine (MESTINON) 60 MG tablet 60 mg 3 (three) times daily. half      . traMADol (ULTRAM) 50 MG tablet Take 100 mg by mouth 3 (three) times daily.        No current facility-administered medications on file prior to visit.    ROS:  Out of a complete 14  system review of symptoms, the patient complains only of the following symptoms, and all other reviewed systems are negative.  Fatigue Neck stiffness, hearing loss, ringing in the ears, drooling Eye discharge Shortness of breath Environmental allergies Frequency of urination Joint pain, back pain, achy muscles, muscle cramps, walking difficulties Insomnia, frequent waking Bruising easily Anxiety  Blood pressure 131/72, pulse 85, weight 269 lb (122.018 kg).  Physical Exam  General: The patient is alert and cooperative at the time of the examination.  Skin: No significant peripheral edema is noted.   Neurologic Exam  Mental status: The patient is oriented x 3.  Cranial nerves: Facial symmetry is present. Speech is normal, no aphasia or dysarthria is noted. Extraocular movements are full. Visual fields are full. Proptosis of the left eye is noted. With superior gaze for 1 minute, no reported diplopia is noted, no ptosis is seen.  Motor: The patient has good strength in all 4 extremities. With the arms outstretched 1 minute, no fatigable weakness of the deltoid muscles were noted.  Sensory examination: Soft touch sensation is symmetric on the face, arms, and legs.  Coordination: The patient has good finger-nose-finger and heel-to-shin bilaterally.  Gait and station: The patient has a normal gait. Tandem gait is normal. Romberg is negative. No drift is seen.  Reflexes: Deep tendon reflexes are symmetric.   Assessment/Plan:  One. Myasthenia gravis  The patient is doing well currently with his myasthenia. The patient will continue the prednisone, CellCept, and Mestinon at the current dose. The patient followup in 6 months. The patient will need to get his blood work done in 3 months.  Joshua Alexanders MD 12/01/2013 7:30 PM  [facility] [address] [place], [place] [address]  Phone [phone] Fax [phone]

## 2013-12-21 ENCOUNTER — Encounter: Payer: 59 | Admitting: Neurology

## 2013-12-21 MED ORDER — CYANOCOBALAMIN 1000 MCG/ML IJ SOLN
1000.0000 ug | Freq: Once | INTRAMUSCULAR | Status: AC
  Administered 2013-12-21: 1000 ug via INTRAMUSCULAR

## 2013-12-21 NOTE — Progress Notes (Signed)
Patient here for B12 injection.  Under aseptic technique Cyanocobalamin given IM in right deltoid.  Tolerated well.  Band-Aid applied. This encounter was created in error - please disregard.

## 2013-12-21 NOTE — Patient Instructions (Signed)
Patient will return next month for B12 injection. 

## 2013-12-25 ENCOUNTER — Ambulatory Visit (INDEPENDENT_AMBULATORY_CARE_PROVIDER_SITE_OTHER): Payer: 59 | Admitting: *Deleted

## 2013-12-25 DIAGNOSIS — E538 Deficiency of other specified B group vitamins: Secondary | ICD-10-CM

## 2013-12-25 MED ORDER — CYANOCOBALAMIN 1000 MCG/ML IJ SOLN
1000.0000 ug | INTRAMUSCULAR | Status: AC
  Administered 2013-12-25 – 2014-04-28 (×5): 1000 ug via INTRAMUSCULAR

## 2013-12-25 NOTE — Patient Instructions (Signed)
See next month. 

## 2013-12-25 NOTE — Progress Notes (Signed)
Pt here for B 12 injection.  Under aseptic technique cyanocobalamin 1000mcg/1ml IM given R deltoid.  Tolerated well.  Bandaid applied.  

## 2014-01-06 ENCOUNTER — Other Ambulatory Visit (HOSPITAL_COMMUNITY): Payer: Self-pay | Admitting: Neurology

## 2014-01-25 ENCOUNTER — Ambulatory Visit (INDEPENDENT_AMBULATORY_CARE_PROVIDER_SITE_OTHER): Payer: 59 | Admitting: Neurology

## 2014-01-25 ENCOUNTER — Encounter (INDEPENDENT_AMBULATORY_CARE_PROVIDER_SITE_OTHER): Payer: Self-pay

## 2014-01-25 DIAGNOSIS — E538 Deficiency of other specified B group vitamins: Secondary | ICD-10-CM

## 2014-01-25 NOTE — Patient Instructions (Signed)
Patient will return next month for B12 injection. 

## 2014-01-25 NOTE — Progress Notes (Signed)
Patient here for B12 injection.  Under aseptic technique Cyanocobalamin given IM in left deltoid.  Tolerated well.  Band-Aid applied. 

## 2014-02-07 ENCOUNTER — Other Ambulatory Visit (HOSPITAL_COMMUNITY): Payer: Self-pay | Admitting: Neurology

## 2014-02-11 ENCOUNTER — Other Ambulatory Visit (HOSPITAL_COMMUNITY): Payer: Self-pay | Admitting: Neurology

## 2014-02-26 ENCOUNTER — Ambulatory Visit (INDEPENDENT_AMBULATORY_CARE_PROVIDER_SITE_OTHER): Payer: 59 | Admitting: *Deleted

## 2014-02-26 DIAGNOSIS — E538 Deficiency of other specified B group vitamins: Secondary | ICD-10-CM

## 2014-02-26 NOTE — Progress Notes (Signed)
Pt here for B 12 injection.  Under aseptic technique cyanocobalamin 1000mcg/1ml IM given R deltoid.  Tolerated well.  Bandaid applied.  

## 2014-02-26 NOTE — Patient Instructions (Signed)
See next month. 

## 2014-03-02 ENCOUNTER — Telehealth: Payer: Self-pay | Admitting: Neurology

## 2014-03-02 DIAGNOSIS — Z5181 Encounter for therapeutic drug level monitoring: Secondary | ICD-10-CM

## 2014-03-02 NOTE — Telephone Encounter (Signed)
I called patient. He is to come in to get blood work drawn, he is on CellCept.

## 2014-03-03 ENCOUNTER — Other Ambulatory Visit (INDEPENDENT_AMBULATORY_CARE_PROVIDER_SITE_OTHER): Payer: Self-pay

## 2014-03-03 DIAGNOSIS — Z5181 Encounter for therapeutic drug level monitoring: Secondary | ICD-10-CM

## 2014-03-03 DIAGNOSIS — Z0289 Encounter for other administrative examinations: Secondary | ICD-10-CM

## 2014-03-03 LAB — COMPREHENSIVE METABOLIC PANEL
ALT: 12 IU/L (ref 0–44)
AST: 13 IU/L (ref 0–40)
Albumin/Globulin Ratio: 2 (ref 1.1–2.5)
Albumin: 4 g/dL (ref 3.6–4.8)
Alkaline Phosphatase: 83 IU/L (ref 39–117)
BUN/Creatinine Ratio: 16 (ref 10–22)
BUN: 17 mg/dL (ref 8–27)
CALCIUM: 9.2 mg/dL (ref 8.6–10.2)
CO2: 29 mmol/L (ref 18–29)
Chloride: 103 mmol/L (ref 96–108)
Creatinine, Ser: 1.08 mg/dL (ref 0.76–1.27)
GFR calc Af Amer: 82 mL/min/{1.73_m2} (ref >59)
GFR, EST NON AFRICAN AMERICAN: 71 mL/min/{1.73_m2} (ref >59)
GLOBULIN, TOTAL: 2 g/dL (ref 1.5–4.5)
GLUCOSE: 115 mg/dL — AB (ref 65–99)
Potassium: 4.2 mmol/L (ref 3.5–5.2)
SODIUM: 138 mmol/L (ref 134–144)
TOTAL PROTEIN: 6 g/dL (ref 6.0–8.5)
Total Bilirubin: 0.5 mg/dL (ref 0.0–1.2)

## 2014-03-03 LAB — CBC WITH DIFFERENTIAL
Basophils Absolute: 0 10*3/uL (ref 0.0–0.2)
Basos: 0 %
EOS: 0 %
Eosinophils Absolute: 0 10*3/uL (ref 0.0–0.4)
HEMATOCRIT: 38.9 % (ref 37.5–51.0)
HEMOGLOBIN: 13 g/dL (ref 12.6–17.7)
LYMPHS: 17 %
Lymphocytes Absolute: 1.8 10*3/uL (ref 0.7–3.1)
MCH: 27.8 pg (ref 26.6–33.0)
MCHC: 33.4 g/dL (ref 31.5–35.7)
MCV: 83 fL (ref 79–97)
Monocytes Absolute: 0.8 10*3/uL (ref 0.1–0.9)
Monocytes: 8 %
NEUTROS ABS: 7.9 10*3/uL — AB (ref 1.4–7.0)
NEUTROS PCT: 75 %
Platelets: 303 10*3/uL (ref 150–379)
RBC: 4.67 x10E6/uL (ref 4.14–5.80)
RDW: 15.9 % — ABNORMAL HIGH (ref 12.3–15.4)
WBC: 10.5 10*3/uL (ref 3.4–10.8)

## 2014-03-04 NOTE — Progress Notes (Signed)
Quick Note:  I LMVM for pt on his cell # that labs ok, unremarkable. He is to call back if questions. ______

## 2014-03-29 ENCOUNTER — Encounter (INDEPENDENT_AMBULATORY_CARE_PROVIDER_SITE_OTHER): Payer: Self-pay

## 2014-03-29 ENCOUNTER — Ambulatory Visit (INDEPENDENT_AMBULATORY_CARE_PROVIDER_SITE_OTHER): Payer: 59 | Admitting: *Deleted

## 2014-03-29 DIAGNOSIS — E538 Deficiency of other specified B group vitamins: Secondary | ICD-10-CM

## 2014-03-29 NOTE — Progress Notes (Signed)
Pt here for B 12 injection.  Under aseptic technique cyanocobalamin 1000mcg/1ml IM given L deltoid.  Tolerated well.  Bandaid applied.  

## 2014-04-28 ENCOUNTER — Ambulatory Visit (INDEPENDENT_AMBULATORY_CARE_PROVIDER_SITE_OTHER): Payer: 59 | Admitting: *Deleted

## 2014-04-28 DIAGNOSIS — E538 Deficiency of other specified B group vitamins: Secondary | ICD-10-CM

## 2014-04-28 NOTE — Patient Instructions (Signed)
Patient to return in one month for next injection.

## 2014-04-28 NOTE — Progress Notes (Signed)
Pt here for B 12 injection.  Under aseptic technique cyanocobalamin 1000mcg/1ml IM given R deltoid.  Tolerated well.  Bandaid applied.  

## 2014-04-29 ENCOUNTER — Ambulatory Visit: Payer: 59

## 2014-05-28 ENCOUNTER — Ambulatory Visit (INDEPENDENT_AMBULATORY_CARE_PROVIDER_SITE_OTHER): Payer: 59 | Admitting: *Deleted

## 2014-05-28 DIAGNOSIS — E538 Deficiency of other specified B group vitamins: Secondary | ICD-10-CM

## 2014-05-28 MED ORDER — CYANOCOBALAMIN 1000 MCG/ML IJ SOLN
1000.0000 ug | Freq: Once | INTRAMUSCULAR | Status: AC
  Administered 2014-05-28: 1000 ug via INTRAMUSCULAR

## 2014-05-28 NOTE — Progress Notes (Signed)
Patient here today for vitamin b-12 injection. Under aseptic technique cyanocobalamin 1000 mcg/1 mL given IM in deltoid. Patient tolerated well band-aid applied.

## 2014-06-03 ENCOUNTER — Encounter (INDEPENDENT_AMBULATORY_CARE_PROVIDER_SITE_OTHER): Payer: Self-pay

## 2014-06-03 ENCOUNTER — Ambulatory Visit (INDEPENDENT_AMBULATORY_CARE_PROVIDER_SITE_OTHER): Payer: 59 | Admitting: Neurology

## 2014-06-03 ENCOUNTER — Encounter: Payer: Self-pay | Admitting: Neurology

## 2014-06-03 VITALS — BP 130/73 | HR 82 | Wt 276.0 lb

## 2014-06-03 DIAGNOSIS — G7000 Myasthenia gravis without (acute) exacerbation: Secondary | ICD-10-CM

## 2014-06-03 DIAGNOSIS — Z5181 Encounter for therapeutic drug level monitoring: Secondary | ICD-10-CM

## 2014-06-03 MED ORDER — BUSPIRONE HCL 7.5 MG PO TABS: 7.5000 mg | ORAL_TABLET | Freq: Two times a day (BID) | ORAL | Status: DC

## 2014-06-03 NOTE — Patient Instructions (Signed)
Myasthenia Gravis Myasthenia gravis is a disease that causes muscle weakness throughout the body. The muscles affected are the ones we can control (voluntary muscles). An example of a voluntary muscle is your hand muscles. You can control the muscles to make the hand pick something up. An example of an involuntary muscle is the heart. The heart beats without any direction from you.  Myasthenia Gravis is thought to be an autoimmune disease. That means that normal defenses of the body begin to attack the body. In this case, the immune system begins to attack cells located at the junctions of the muscles and the nerves. Women are affected more often. Women are affected at a younger age than men. Babies born to affected women frequently develop symptoms at an early age. SYMPTOMS Initially in the disease, the facial muscles are affected first. After this, a person may develop droopy eyelids. They may have difficulty controlling facial muscles. They may have problems chewing. Swallowing and speaking may become impaired. The weakness gradually spreads to the arms and legs. It begins to affect breathing. Sometimes, the symptoms lessen or go away without any apparent cause. DIAGNOSIS  Diagnosis can be made with blood tests. Tests such as electromyography may be done to examine the electrical activity in the muscle. An improvement in symptoms after having an anti-cholinesterase drug helps confirm the diagnosis.  TREATMENT  Medicines are usually prescribed as the first treatment. These medicines help, but they do not cure the disease. A plasma cleansing procedure (plasmapheresis) can be used to treat a crisis. It can also be used to prepare a person for surgery. This procedure produces short-term improvement. Some cases are helped by removing the thymus gland. Steroids are used for short-term benefits. Document Released: 12/03/2000 Document Revised: 11/19/2011 Document Reviewed: 10/28/2013 [employer] Patient  Information 2015 [employer]. This information is not intended to replace advice given to you by your health care provider. Make sure you discuss any questions you have with your health care provider.  

## 2014-06-03 NOTE — Progress Notes (Signed)
Reason for visit: Myasthenia gravis  Joshua [name] is an 68 y.o. male  History of present illness:  Joshua [name] is a 68 year old right-handed white male with a history of obesity, myasthenia gravis, and chronic low back pain. 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 The patient has done quite well with his myasthenia since last seen. He denies any significant issues with double vision, ptosis, difficulty chewing or swallowing. The patient reports some days with fatigue, but usually he has a good energy level. He denies any significant medical issues that have come up since last seen. The patient did have a recent episode of cellulitis involving the ankle.   Past Medical History  Diagnosis Date  . Colon cancer   . Myasthenia gravis   . Diabetes   . Hypertension   . Hyperlipidemia   . Morbid obesity   . Anemia   . Diverticulosis   . Hemorrhoids   . Colon polyps   . DJD (degenerative joint disease)   . Anxiety   . Arthritis   . Basal cell carcinoma   . Myasthenia gravis without exacerbation 05/19/2013  . Vitamin B12 deficiency   . Carpal tunnel syndrome, bilateral     Past Surgical History  Procedure Laterality Date  . Right colectomy  2006  . Patella fracture surgery Left 2011  . Ankle arthroplasty Right 1969  . Tonsillectomy  1968  . Basal cell carcinoma excision      face  . Carpal tunnel with cubital tunnel Left 06/02/2013    Procedure: CARPAL TUNNEL WITH CUBITAL TUNNEL;  Surgeon: Tennis Must, MD;  Location: [facility];  Service: Orthopedics;  Laterality: Left;  . Carpal tunnel with cubital tunnel Right 07/14/2013    Procedure: RIGHT CARPAL TUNNEL AND CUBITAL TUNNEL RELEASE;  Surgeon: Tennis Must, MD;  Location: [facility];  Service: Orthopedics;  Laterality: Right;    Family History  Problem Relation Age of Onset  . Prostate cancer Maternal Grandfather     . Colon polyps Maternal Grandfather   . Heart disease Mother   . Colon cancer Neg Hx     Social history:  reports that he has been smoking Cigars.  He has never used smokeless tobacco. He reports that he drinks alcohol. He reports that he does not use illicit drugs.   No Known Allergies  Medications:  Current Outpatient Prescriptions on File Prior to Visit  Medication Sig Dispense Refill  . atorvastatin (LIPITOR) 40 MG tablet Take 40 mg by mouth daily.      . B-D ULTRAFINE III SHORT PEN 31G X 8 MM MISC 200 each by Other route 3 (three) times daily between meals.      . Cyanocobalamin (VITAMIN B-12 IJ) Inject 1,000 mcg as directed every 30 (thirty) days.      Marland Kitchen DEXILANT 60 MG capsule Take 60 mg by mouth daily.       . insulin aspart protamine-insulin aspart (NOVOLOG 70/30) (70-30) 100 UNIT/ML injection Inject 48 Units into the skin 2 (two) times daily with a meal.      . INVOKANA 300 MG TABS Take 1 tablet by mouth daily.       Marland Kitchen lisinopril (PRINIVIL,ZESTRIL) 5 MG tablet Take 5 mg by mouth daily.      . metFORMIN (GLUCOPHAGE) 1000 MG tablet Take 1,000 mg by mouth daily with breakfast.       .  Multiple Vitamins-Minerals (OCUVITE PO) Take 1 tablet by mouth daily.      . mycophenolate (CELLCEPT) 500 MG tablet TAKE 2 TABLETS BY MOUTH   TWICE A DAY  360 tablet  1  . ONE TOUCH ULTRA TEST test strip 200 each by Other route 3 (three) times daily.      Glory Rosebush DELICA LANCETS 09F MISC 100 each by Other route 3 (three) times daily.      Marland Kitchen pyridostigmine (MESTINON) 60 MG tablet TAKE 0.5 TABLETS (30 MG TOTAL) BY MOUTH 3 (THREE) TIMES DAILY.  135 tablet  1  . traMADol (ULTRAM) 50 MG tablet Take 100 mg by mouth 3 (three) times daily.        No current facility-administered medications on file prior to visit.    ROS:  Out of a complete 14 system review of symptoms, the patient complains only of the following symptoms, and all other reviewed systems are negative.  Ringing in the ears Eye  discharge Diarrhea Insomnia, frequent waking, daytime sleepiness Frequency of urination Joint swelling, joint pain, back pain Moles Bruising easily Anxiety  Blood pressure 130/73, pulse 82, weight 276 lb (125.193 kg).  Physical Exam  General: The patient is alert and cooperative at the time of the examination. The patient is markedly obese.  Skin: No significant peripheral edema is noted.   Neurologic Exam  Mental status: The patient is oriented x 3.  Cranial nerves: Facial symmetry is present. Speech is normal, no aphasia or dysarthria is noted. Extraocular movements are full. Visual fields are full. The patient has good strength of the facial muscles, and with jaw opening and closure. The superior gaze for 1 minute, no ptosis or subjective double vision is noted. The patient has proptosis of the left eye.  Motor: The patient has good strength in all 4 extremities. With the arms outstretched 1 minute, no fatigable weakness of the deltoid muscles is seen.  Sensory examination: Soft touch sensation is symmetric on the face, arms, legs.  Coordination: The patient has good finger-nose-finger and heel-to-shin bilaterally.  Gait and station: The patient has a normal gait. Tandem gait is unsteady. Romberg is negative. No drift is seen.  Reflexes: Deep tendon reflexes are symmetric.   Assessment/Plan:  1. Myasthenia gravis  2. Chronic low back pain  The patient is doing well at this time with the myasthenia. We will cut back on the prednisone taking 5 mg daily. The patient will continue the Mestinon and the CellCept, and he will have blood work done today. He will followup in 6 months. He will contact me if he believes that the reduction in prednisone has resulted in increased myasthenic symptoms.  Jill Alexanders MD 06/03/2014 8:11 PM  [facility] [address] [place], [place] [address]  Phone [phone] Fax [phone]

## 2014-06-04 ENCOUNTER — Telehealth: Payer: Self-pay | Admitting: Neurology

## 2014-06-04 LAB — COMPREHENSIVE METABOLIC PANEL
A/G RATIO: 2.1 (ref 1.1–2.5)
ALBUMIN: 4 g/dL (ref 3.6–4.8)
ALT: 12 IU/L (ref 0–44)
AST: 10 IU/L (ref 0–40)
Alkaline Phosphatase: 82 IU/L (ref 39–117)
BUN/Creatinine Ratio: 13 (ref 10–22)
BUN: 15 mg/dL (ref 8–27)
CO2: 26 mmol/L (ref 18–29)
Calcium: 9.2 mg/dL (ref 8.6–10.2)
Chloride: 107 mmol/L (ref 97–108)
Creatinine, Ser: 1.12 mg/dL (ref 0.76–1.27)
GFR, EST AFRICAN AMERICAN: 78 mL/min/{1.73_m2} (ref >59)
GFR, EST NON AFRICAN AMERICAN: 68 mL/min/{1.73_m2} (ref >59)
GLOBULIN, TOTAL: 1.9 g/dL (ref 1.5–4.5)
Glucose: 123 mg/dL — ABNORMAL HIGH (ref 65–99)
Potassium: 4.2 mmol/L (ref 3.5–5.2)
SODIUM: 145 mmol/L — AB (ref 134–144)
Total Bilirubin: 0.2 mg/dL (ref 0.0–1.2)
Total Protein: 5.9 g/dL — ABNORMAL LOW (ref 6.0–8.5)

## 2014-06-04 LAB — CBC WITH DIFFERENTIAL
Basophils Absolute: 0 10*3/uL (ref 0.0–0.2)
Basos: 0 %
EOS ABS: 0 10*3/uL (ref 0.0–0.4)
EOS: 0 %
HCT: 38.3 % (ref 37.5–51.0)
Hemoglobin: 12.9 g/dL (ref 12.6–17.7)
IMMATURE GRANULOCYTES: 0 %
Immature Grans (Abs): 0 10*3/uL (ref 0.0–0.1)
LYMPHS ABS: 2.1 10*3/uL (ref 0.7–3.1)
Lymphs: 21 %
MCH: 29.1 pg (ref 26.6–33.0)
MCHC: 33.7 g/dL (ref 31.5–35.7)
MCV: 86 fL (ref 79–97)
MONOS ABS: 0.8 10*3/uL (ref 0.1–0.9)
Monocytes: 8 %
Neutrophils Absolute: 7 10*3/uL (ref 1.4–7.0)
Neutrophils Relative %: 71 %
PLATELETS: 289 10*3/uL (ref 150–379)
RBC: 4.44 x10E6/uL (ref 4.14–5.80)
RDW: 13.9 % (ref 12.3–15.4)
WBC: 9.9 10*3/uL (ref 3.4–10.8)

## 2014-06-04 NOTE — Telephone Encounter (Signed)
I called the patient. The blood work is OK, sodium level is very slightly high.

## 2014-06-28 ENCOUNTER — Ambulatory Visit (INDEPENDENT_AMBULATORY_CARE_PROVIDER_SITE_OTHER): Payer: 59 | Admitting: *Deleted

## 2014-06-28 DIAGNOSIS — E538 Deficiency of other specified B group vitamins: Secondary | ICD-10-CM

## 2014-06-28 MED ORDER — CYANOCOBALAMIN 1000 MCG/ML IJ SOLN
1000.0000 ug | Freq: Once | INTRAMUSCULAR | Status: AC
  Administered 2014-06-28: 1000 ug via INTRAMUSCULAR

## 2014-06-28 NOTE — Progress Notes (Signed)
Pt here for B 12 injection.  Under aseptic technique cyanocobalamin 1000mcg/1ml IM given L deltoid.  Tolerated well.  Bandaid applied.  

## 2014-06-28 NOTE — Patient Instructions (Signed)
See next month. 

## 2014-07-13 ENCOUNTER — Other Ambulatory Visit: Payer: Self-pay | Admitting: Neurology

## 2014-07-18 ENCOUNTER — Other Ambulatory Visit: Payer: Self-pay | Admitting: Neurology

## 2014-07-28 ENCOUNTER — Encounter: Payer: Self-pay | Admitting: Neurology

## 2014-07-29 ENCOUNTER — Ambulatory Visit (INDEPENDENT_AMBULATORY_CARE_PROVIDER_SITE_OTHER): Payer: 59 | Admitting: Neurology

## 2014-07-29 DIAGNOSIS — E538 Deficiency of other specified B group vitamins: Secondary | ICD-10-CM

## 2014-07-29 MED ORDER — CYANOCOBALAMIN 1000 MCG/ML IJ SOLN
1000.0000 ug | Freq: Once | INTRAMUSCULAR | Status: AC
  Administered 2014-07-29: 1000 ug via INTRAMUSCULAR

## 2014-07-29 NOTE — Progress Notes (Signed)
Under aseptic technique cyanocobalamin 1043mcg/1ml given IM in left gluteal.  Tolerated well.  Band-Aid applied.

## 2014-08-03 ENCOUNTER — Encounter: Payer: Self-pay | Admitting: Neurology

## 2014-08-26 ENCOUNTER — Ambulatory Visit (INDEPENDENT_AMBULATORY_CARE_PROVIDER_SITE_OTHER): Payer: 59 | Admitting: *Deleted

## 2014-08-26 DIAGNOSIS — E538 Deficiency of other specified B group vitamins: Secondary | ICD-10-CM

## 2014-08-26 MED ORDER — CYANOCOBALAMIN 1000 MCG/ML IJ SOLN
1000.0000 ug | INTRAMUSCULAR | Status: AC
  Administered 2014-08-26 – 2015-01-27 (×5): 1000 ug via INTRAMUSCULAR

## 2014-08-26 NOTE — Progress Notes (Signed)
Pt here for B 12 injection.  Under aseptic technique cyanocobalamin 1000mcg/1ml IM given R deltoid.  Tolerated well.  Bandaid applied.  

## 2014-08-26 NOTE — Patient Instructions (Signed)
See next month. 

## 2014-09-27 ENCOUNTER — Ambulatory Visit (INDEPENDENT_AMBULATORY_CARE_PROVIDER_SITE_OTHER): Payer: 59

## 2014-09-27 DIAGNOSIS — E538 Deficiency of other specified B group vitamins: Secondary | ICD-10-CM

## 2014-09-28 NOTE — Patient Instructions (Signed)
Pt here for B 12 injection. Under aseptic technique cyanocobalamin 1055mcg/1ml IM given L deltoid. Tolerated well.

## 2014-10-28 ENCOUNTER — Ambulatory Visit (INDEPENDENT_AMBULATORY_CARE_PROVIDER_SITE_OTHER): Payer: 59 | Admitting: *Deleted

## 2014-10-28 ENCOUNTER — Other Ambulatory Visit: Payer: Self-pay | Admitting: Neurology

## 2014-10-28 DIAGNOSIS — E538 Deficiency of other specified B group vitamins: Secondary | ICD-10-CM

## 2014-10-28 NOTE — Progress Notes (Signed)
Pt here for B 12 injection.  Under aseptic technique cyanocobalamin 1000mcg/1ml IM given R deltoid.  Tolerated well.  Bandaid applied.  

## 2014-10-28 NOTE — Patient Instructions (Signed)
Patient will return in 1 month for next injection. 

## 2014-11-25 ENCOUNTER — Ambulatory Visit (INDEPENDENT_AMBULATORY_CARE_PROVIDER_SITE_OTHER): Payer: 59 | Admitting: *Deleted

## 2014-11-25 DIAGNOSIS — E538 Deficiency of other specified B group vitamins: Secondary | ICD-10-CM

## 2014-11-25 NOTE — Patient Instructions (Signed)
Patient will return in one month for next injection. 

## 2014-11-25 NOTE — Progress Notes (Signed)
Pt here for B 12 injection.  Under aseptic technique cyanocobalamin 1000mcg/1ml IM given R deltoid.  Tolerated well.  Bandaid applied.  

## 2014-12-07 ENCOUNTER — Encounter: Payer: Self-pay | Admitting: Neurology

## 2014-12-07 ENCOUNTER — Ambulatory Visit (INDEPENDENT_AMBULATORY_CARE_PROVIDER_SITE_OTHER): Payer: 59 | Admitting: Neurology

## 2014-12-07 VITALS — BP 144/73 | HR 96 | Ht 68.0 in | Wt 272.2 lb

## 2014-12-07 DIAGNOSIS — G7000 Myasthenia gravis without (acute) exacerbation: Secondary | ICD-10-CM

## 2014-12-07 DIAGNOSIS — M545 Low back pain, unspecified: Secondary | ICD-10-CM

## 2014-12-07 DIAGNOSIS — G8929 Other chronic pain: Secondary | ICD-10-CM | POA: Insufficient documentation

## 2014-12-07 HISTORY — DX: Other chronic pain: G89.29

## 2014-12-07 HISTORY — DX: Low back pain, unspecified: M54.50

## 2014-12-07 MED ORDER — PREDNISONE 1 MG PO TABS: ORAL_TABLET | ORAL | Status: DC

## 2014-12-07 NOTE — Patient Instructions (Signed)
Myasthenia Gravis Myasthenia gravis is a disease that causes muscle weakness throughout the body. The muscles affected are the ones we can control (voluntary muscles). An example of a voluntary muscle is your hand muscles. You can control the muscles to make the hand pick something up. An example of an involuntary muscle is the heart. The heart beats without any direction from you.  Myasthenia Gravis is thought to be an autoimmune disease. That means that normal defenses of the body begin to attack the body. In this case, the immune system begins to attack cells located at the junctions of the muscles and the nerves. Women are affected more often. Women are affected at a younger age than men. Babies born to affected women frequently develop symptoms at an early age. SYMPTOMS Initially in the disease, the facial muscles are affected first. After this, a person may develop droopy eyelids. They may have difficulty controlling facial muscles. They may have problems chewing. Swallowing and speaking may become impaired. The weakness gradually spreads to the arms and legs. It begins to affect breathing. Sometimes, the symptoms lessen or go away without any apparent cause. DIAGNOSIS  Diagnosis can be made with blood tests. Tests such as electromyography may be done to examine the electrical activity in the muscle. An improvement in symptoms after having an anti-cholinesterase drug helps confirm the diagnosis.  TREATMENT  Medicines are usually prescribed as the first treatment. These medicines help, but they do not cure the disease. A plasma cleansing procedure (plasmapheresis) can be used to treat a crisis. It can also be used to prepare a person for surgery. This procedure produces short-term improvement. Some cases are helped by removing the thymus gland. Steroids are used for short-term benefits. Document Released: 12/03/2000 Document Revised: 11/19/2011 Document Reviewed: 10/28/2013 [employer] Patient  Information 2015 [employer]. This information is not intended to replace advice given to you by your health care provider. Make sure you discuss any questions you have with your health care provider.  

## 2014-12-07 NOTE — Progress Notes (Signed)
Reason for visit: Myasthenia gravis  Joshua [name] is an 69 y.o. male  History of present illness:  Mr. Harnois is a 69 year old right-handed white male with a history of myasthenia gravis and obesity associated with some low back pain. The patient indicates that his myasthenia gravis has been relatively stable since last seen. He just recently had annual blood work that included a CBC and liver profile through his primary care doctor, and he was told that the blood work was unremarkable. He has diabetes, and he believes that his last hemoglobin A1c was around 7.2. He is on Mestinon, CellCept, and low-dose prednisone at 5 mg daily. He is tolerating the medications fairly well. He has had ongoing issues with low back pain, and he is not sleeping well at times because of this. He is considering retiring in the next several months. He comes back to this office for an evaluation.  Past Medical History  Diagnosis Date  . Colon cancer   . Myasthenia gravis   . Diabetes   . Hypertension   . Hyperlipidemia   . Morbid obesity   . Anemia   . Diverticulosis   . Hemorrhoids   . Colon polyps   . DJD (degenerative joint disease)   . Anxiety   . Arthritis   . Basal cell carcinoma   . Myasthenia gravis without exacerbation 05/19/2013  . Vitamin B12 deficiency   . Carpal tunnel syndrome, bilateral     Past Surgical History  Procedure Laterality Date  . Right colectomy  2006  . Patella fracture surgery Left 2011  . Ankle arthroplasty Right 1969  . Tonsillectomy  1968  . Basal cell carcinoma excision      face  . Carpal tunnel with cubital tunnel Left 06/02/2013    Procedure: CARPAL TUNNEL WITH CUBITAL TUNNEL;  Surgeon: Tennis Must, MD;  Location: [facility];  Service: Orthopedics;  Laterality: Left;  . Carpal tunnel with cubital tunnel Right 07/14/2013    Procedure: RIGHT CARPAL TUNNEL AND CUBITAL TUNNEL RELEASE;  Surgeon: Tennis Must, MD;  Location: [facility];  Service: Orthopedics;  Laterality: Right;    Family History  Problem Relation Age of Onset  . Prostate cancer Maternal Grandfather   . Colon polyps Maternal Grandfather   . Heart disease Mother   . Colon cancer Neg Hx     Social history:  reports that he has been smoking Cigars.  He has never used smokeless tobacco. He reports that he drinks alcohol. He reports that he does not use illicit drugs.    Allergies  Allergen Reactions  . Aluminum Chlorohydrate Rash  . Niacin And Related Rash    Medications:  Prior to Admission medications   Medication Sig Start Date End Date Taking? Authorizing Provider  atorvastatin (LIPITOR) 40 MG tablet Take 40 mg by mouth daily.   Yes Historical Provider, MD  B-D ULTRAFINE III SHORT PEN 31G X 8 MM MISC 200 each by Other route 3 (three) times daily between meals. 04/09/13  Yes Historical Provider, MD  busPIRone (BUSPAR) 7.5 MG tablet Take 1 tablet (7.5 mg total) by mouth 2 (two) times daily. 06/03/14  Yes Kathrynn Ducking, MD  cetirizine (ZYRTEC) 10 MG tablet Take 10 mg by mouth daily.   Yes Historical Provider, MD  Cyanocobalamin (VITAMIN B-12 IJ) Inject 1,000 mcg as directed every 30 (thirty) days.   Yes Historical Provider, MD  DEXILANT 60 MG capsule Take 60 mg by  mouth daily.  12/09/12  Yes Historical Provider, MD  insulin aspart protamine-insulin aspart (NOVOLOG 70/30) (70-30) 100 UNIT/ML injection Inject 48 Units into the skin 2 (two) times daily with a meal.   Yes Historical Provider, MD  INVOKANA 300 MG TABS Take 1 tablet by mouth daily.  11/26/12  Yes Historical Provider, MD  lisinopril (PRINIVIL,ZESTRIL) 5 MG tablet Take 5 mg by mouth daily. 11/27/13  Yes Historical Provider, MD  metFORMIN (GLUCOPHAGE) 1000 MG tablet Take 1,000 mg by mouth daily with breakfast.  09/25/12  Yes Historical Provider, MD  Multiple Vitamins-Minerals (OCUVITE PO) Take 1 tablet by mouth daily.   Yes Historical Provider, MD  mycophenolate (CELLCEPT) 500 MG tablet  TAKE 2 TABLETS BY MOUTH TWICE A DAY 10/28/14  Yes Kathrynn Ducking, MD  ONE TOUCH ULTRA TEST test strip 200 each by Other route 3 (three) times daily. 04/09/13  Yes Historical Provider, MD  Jonetta Speak LANCETS 30Z MISC 100 each by Other route 3 (three) times daily. 04/14/13  Yes Historical Provider, MD  pyridostigmine (MESTINON) 60 MG tablet TAKE 0.5 TABLETS (30 MG TOTAL) BY MOUTH 3 (THREE) TIMES DAILY.   Yes Kathrynn Ducking, MD  traMADol (ULTRAM) 50 MG tablet Take 100 mg by mouth 3 (three) times daily.  09/09/12  Yes Historical Provider, MD  predniSONE (DELTASONE) 1 MG tablet Begin 4 tablets daily, taper by one tablet every 2 months until off of the medication 12/07/14   Kathrynn Ducking, MD    ROS:  Out of a complete 14 system review of symptoms, the patient complains only of the following symptoms, and all other reviewed systems are negative.  Fatigue Low back pain  Blood pressure 144/73, pulse 96, height 5\' 8"  (1.727 m), weight 272 lb 3.2 oz (123.469 kg).  Physical Exam  General: The patient is alert and cooperative at the time of the examination. The patient is markedly obese.  Skin: No significant peripheral edema is noted.   Neurologic Exam  Mental status: The patient is alert and oriented x 3 at the time of the examination. The patient has apparent normal recent and remote memory, with an apparently normal attention span and concentration ability.   Cranial nerves: Facial symmetry is present. Speech is normal, no aphasia or dysarthria is noted. Extraocular movements are full. Visual fields are full. With superior deviation of the eyes for 1 minute, there is no increased ptosis or divergence of gaze, no subjective double vision.  Motor: The patient has good strength in all 4 extremities. With the arms outstretched 1 minute, no fatigable weakness of the deltoid muscles is seen.  Sensory examination: Soft touch sensation is symmetric on the face, arms, and legs.  Coordination:  The patient has good finger-nose-finger and heel-to-shin bilaterally.  Gait and station: The patient has a normal gait. Tandem gait is normal. Romberg is negative. No drift is seen.  Reflexes: Deep tendon reflexes are symmetric.   Assessment/Plan:  1. Myasthenia gravis  2. Chronic low back pain, lumbosacral spinal stenosis  3. Diabetes  4. Obesity   The patient appears to be relatively stable with his myasthenia gravis. We will continue the CellCept, but the prednisone can start being tapered. The patient will go to 4 mg daily for 2 months, then begin to taper by one milligram of prednisone every 2 months until off the medication. He will follow-up in 6 months. He will call me if he has a worsening in his condition. The patient is followed by Dr. Sherwood Gambler  for his low back pain.  Jill Alexanders MD 12/07/2014 7:24 PM  [facility] [address] [place], [place] [address]  Phone [phone] Fax [phone]

## 2014-12-08 ENCOUNTER — Telehealth: Payer: Self-pay | Admitting: Neurology

## 2014-12-08 MED ORDER — PREDNISONE 1 MG PO TABS: ORAL_TABLET | ORAL | Status: DC

## 2014-12-08 NOTE — Telephone Encounter (Signed)
Patient requesting 90 day supply for Rx predniSONE (DELTASONE) 1 MG tablet forwarded to [facility].  Please call and advise.

## 2014-12-08 NOTE — Telephone Encounter (Signed)
Rx sent.  Called back, got no answer, no option to leave message.

## 2014-12-15 ENCOUNTER — Other Ambulatory Visit: Payer: Self-pay

## 2014-12-15 MED ORDER — PREDNISONE 1 MG PO TABS: ORAL_TABLET | ORAL | Status: DC

## 2014-12-15 NOTE — Telephone Encounter (Signed)
[facility] has [facility] per patient request.  I called the patient back on number provided.  Got no answer.  Left message.

## 2014-12-15 NOTE — Telephone Encounter (Signed)
Patient stated Rx predniSONE need to be sent to local [facility].  Please call [phone] and advise.

## 2014-12-27 ENCOUNTER — Ambulatory Visit: Payer: 59

## 2014-12-28 ENCOUNTER — Ambulatory Visit (INDEPENDENT_AMBULATORY_CARE_PROVIDER_SITE_OTHER): Payer: 59 | Admitting: *Deleted

## 2014-12-28 DIAGNOSIS — E538 Deficiency of other specified B group vitamins: Secondary | ICD-10-CM | POA: Diagnosis not present

## 2014-12-28 MED ORDER — CYANOCOBALAMIN 1000 MCG/ML IJ SOLN
1000.0000 ug | Freq: Once | INTRAMUSCULAR | Status: AC
  Administered 2014-12-28: 1000 ug via INTRAMUSCULAR

## 2014-12-28 NOTE — Progress Notes (Signed)
Pt received a Vit B12 injection into the right deltoid

## 2014-12-28 NOTE — Patient Instructions (Signed)
Pt was instructed that he would be receiving 1000 mg of Vit B12 IM injection

## 2015-01-27 ENCOUNTER — Ambulatory Visit (INDEPENDENT_AMBULATORY_CARE_PROVIDER_SITE_OTHER): Payer: 59

## 2015-01-27 DIAGNOSIS — E538 Deficiency of other specified B group vitamins: Secondary | ICD-10-CM | POA: Diagnosis not present

## 2015-02-16 ENCOUNTER — Other Ambulatory Visit: Payer: Self-pay | Admitting: Neurology

## 2015-02-28 ENCOUNTER — Ambulatory Visit: Payer: 59

## 2015-03-01 ENCOUNTER — Ambulatory Visit (INDEPENDENT_AMBULATORY_CARE_PROVIDER_SITE_OTHER): Payer: 59 | Admitting: *Deleted

## 2015-03-01 DIAGNOSIS — E538 Deficiency of other specified B group vitamins: Secondary | ICD-10-CM | POA: Diagnosis not present

## 2015-03-01 MED ORDER — CYANOCOBALAMIN 1000 MCG/ML IJ SOLN
1000.0000 ug | INTRAMUSCULAR | Status: AC
  Administered 2015-03-01 – 2015-05-30 (×3): 1000 ug via INTRAMUSCULAR

## 2015-03-01 NOTE — Patient Instructions (Signed)
Pt to be see next month.

## 2015-03-01 NOTE — Progress Notes (Signed)
Pt here for B 12 injection.  Under aseptic technique cyanocobalamin 1000mcg/1ml IM given L deltoid.  Tolerated well.  Bandaid applied.  

## 2015-03-09 ENCOUNTER — Telehealth: Payer: Self-pay | Admitting: Neurology

## 2015-03-09 DIAGNOSIS — Z5181 Encounter for therapeutic drug level monitoring: Secondary | ICD-10-CM

## 2015-03-09 NOTE — Telephone Encounter (Signed)
I called the patient. The patient is coming for blood work, he is on CellCept.

## 2015-03-10 ENCOUNTER — Other Ambulatory Visit (INDEPENDENT_AMBULATORY_CARE_PROVIDER_SITE_OTHER): Payer: Self-pay

## 2015-03-10 DIAGNOSIS — Z0289 Encounter for other administrative examinations: Secondary | ICD-10-CM

## 2015-03-10 DIAGNOSIS — Z5181 Encounter for therapeutic drug level monitoring: Secondary | ICD-10-CM

## 2015-03-11 ENCOUNTER — Telehealth: Payer: Self-pay

## 2015-03-11 LAB — COMPREHENSIVE METABOLIC PANEL
ALBUMIN: 4.2 g/dL (ref 3.6–4.8)
ALK PHOS: 85 IU/L (ref 39–117)
ALT: 13 IU/L (ref 0–44)
AST: 13 IU/L (ref 0–40)
Albumin/Globulin Ratio: 2.2 (ref 1.1–2.5)
BUN/Creatinine Ratio: 17 (ref 10–22)
BUN: 18 mg/dL (ref 8–27)
Bilirubin Total: 0.3 mg/dL (ref 0.0–1.2)
CO2: 23 mmol/L (ref 18–29)
Calcium: 9.1 mg/dL (ref 8.6–10.2)
Chloride: 106 mmol/L (ref 97–108)
Creatinine, Ser: 1.07 mg/dL (ref 0.76–1.27)
GFR calc non Af Amer: 71 mL/min/{1.73_m2} (ref >59)
GFR, EST AFRICAN AMERICAN: 83 mL/min/{1.73_m2} (ref >59)
GLOBULIN, TOTAL: 1.9 g/dL (ref 1.5–4.5)
Glucose: 115 mg/dL — ABNORMAL HIGH (ref 65–99)
POTASSIUM: 4.4 mmol/L (ref 3.5–5.2)
SODIUM: 143 mmol/L (ref 134–144)
Total Protein: 6.1 g/dL (ref 6.0–8.5)

## 2015-03-11 LAB — CBC WITH DIFFERENTIAL/PLATELET
BASOS ABS: 0 10*3/uL (ref 0.0–0.2)
BASOS: 0 %
EOS (ABSOLUTE): 0 10*3/uL (ref 0.0–0.4)
Eos: 0 %
Hematocrit: 38.5 % (ref 37.5–51.0)
Hemoglobin: 12.7 g/dL (ref 12.6–17.7)
Immature Grans (Abs): 0 10*3/uL (ref 0.0–0.1)
Immature Granulocytes: 0 %
LYMPHS: 22 %
Lymphocytes Absolute: 2.2 10*3/uL (ref 0.7–3.1)
MCH: 28 pg (ref 26.6–33.0)
MCHC: 33 g/dL (ref 31.5–35.7)
MCV: 85 fL (ref 79–97)
Monocytes Absolute: 0.9 10*3/uL (ref 0.1–0.9)
Monocytes: 9 %
NEUTROS PCT: 69 %
Neutrophils Absolute: 7.2 10*3/uL — ABNORMAL HIGH (ref 1.4–7.0)
Platelets: 330 10*3/uL (ref 150–379)
RBC: 4.53 x10E6/uL (ref 4.14–5.80)
RDW: 14.6 % (ref 12.3–15.4)
WBC: 10.4 10*3/uL (ref 3.4–10.8)

## 2015-03-11 NOTE — Telephone Encounter (Signed)
-----   Message from Kathrynn Ducking, MD sent at 03/11/2015  7:43 AM EDT -----  The blood work results are unremarkable. Please call the patient.  ----- Message -----    From: [facility] Lab Results In Interface    Sent: 03/11/2015   5:42 AM      To: Kathrynn Ducking, MD

## 2015-03-11 NOTE — Telephone Encounter (Signed)
I called and spoke to the patient's wife. I explained to her that his blood work was normal and she stated she would let him know.

## 2015-03-28 ENCOUNTER — Other Ambulatory Visit: Payer: Self-pay | Admitting: Neurology

## 2015-03-31 ENCOUNTER — Other Ambulatory Visit: Payer: Self-pay | Admitting: Neurology

## 2015-03-31 ENCOUNTER — Ambulatory Visit (INDEPENDENT_AMBULATORY_CARE_PROVIDER_SITE_OTHER): Payer: 59

## 2015-03-31 ENCOUNTER — Ambulatory Visit: Payer: 59

## 2015-03-31 DIAGNOSIS — E538 Deficiency of other specified B group vitamins: Secondary | ICD-10-CM | POA: Diagnosis not present

## 2015-04-06 ENCOUNTER — Other Ambulatory Visit: Payer: Self-pay | Admitting: Neurosurgery

## 2015-04-22 ENCOUNTER — Encounter (HOSPITAL_COMMUNITY): Payer: Self-pay

## 2015-04-22 ENCOUNTER — Encounter (HOSPITAL_COMMUNITY)
Admission: RE | Admit: 2015-04-22 | Discharge: 2015-04-22 | Disposition: A | Discharge status: Home / Self Care | Payer: 59 | Source: Ambulatory Visit | Attending: Neurosurgery | Admitting: Neurosurgery

## 2015-04-22 ENCOUNTER — Other Ambulatory Visit (HOSPITAL_COMMUNITY): Payer: Self-pay

## 2015-04-22 DIAGNOSIS — Z85038 Personal history of other malignant neoplasm of large intestine: Secondary | ICD-10-CM | POA: Insufficient documentation

## 2015-04-22 DIAGNOSIS — Z79899 Other long term (current) drug therapy: Secondary | ICD-10-CM | POA: Diagnosis not present

## 2015-04-22 DIAGNOSIS — E785 Hyperlipidemia, unspecified: Secondary | ICD-10-CM | POA: Insufficient documentation

## 2015-04-22 DIAGNOSIS — K219 Gastro-esophageal reflux disease without esophagitis: Secondary | ICD-10-CM | POA: Insufficient documentation

## 2015-04-22 DIAGNOSIS — Z794 Long term (current) use of insulin: Secondary | ICD-10-CM | POA: Insufficient documentation

## 2015-04-22 DIAGNOSIS — M4806 Spinal stenosis, lumbar region: Secondary | ICD-10-CM | POA: Insufficient documentation

## 2015-04-22 DIAGNOSIS — E119 Type 2 diabetes mellitus without complications: Secondary | ICD-10-CM | POA: Insufficient documentation

## 2015-04-22 DIAGNOSIS — Z01812 Encounter for preprocedural laboratory examination: Secondary | ICD-10-CM | POA: Diagnosis not present

## 2015-04-22 DIAGNOSIS — R9431 Abnormal electrocardiogram [ECG] [EKG]: Secondary | ICD-10-CM | POA: Insufficient documentation

## 2015-04-22 DIAGNOSIS — I10 Essential (primary) hypertension: Secondary | ICD-10-CM | POA: Insufficient documentation

## 2015-04-22 DIAGNOSIS — F17200 Nicotine dependence, unspecified, uncomplicated: Secondary | ICD-10-CM | POA: Insufficient documentation

## 2015-04-22 DIAGNOSIS — G7000 Myasthenia gravis without (acute) exacerbation: Secondary | ICD-10-CM | POA: Diagnosis not present

## 2015-04-22 DIAGNOSIS — Z01818 Encounter for other preprocedural examination: Secondary | ICD-10-CM | POA: Insufficient documentation

## 2015-04-22 HISTORY — DX: Gastro-esophageal reflux disease without esophagitis: K21.9

## 2015-04-22 LAB — CBC
HCT: 41.9 % (ref 39.0–52.0)
Hemoglobin: 13.5 g/dL (ref 13.0–17.0)
MCH: 29.2 pg (ref 26.0–34.0)
MCHC: 32.2 g/dL (ref 30.0–36.0)
MCV: 90.5 fL (ref 78.0–100.0)
PLATELETS: 283 10*3/uL (ref 150–400)
RBC: 4.63 MIL/uL (ref 4.22–5.81)
RDW: 14.2 % (ref 11.5–15.5)
WBC: 13 10*3/uL — ABNORMAL HIGH (ref 4.0–10.5)

## 2015-04-22 LAB — SURGICAL PCR SCREEN
MRSA, PCR: NEGATIVE
Staphylococcus aureus: NEGATIVE

## 2015-04-22 LAB — BASIC METABOLIC PANEL
Anion gap: 11 (ref 5–15)
BUN: 15 mg/dL (ref 6–20)
CALCIUM: 9.1 mg/dL (ref 8.9–10.3)
CO2: 21 mmol/L — AB (ref 22–32)
CREATININE: 1.09 mg/dL (ref 0.61–1.24)
Chloride: 110 mmol/L (ref 101–111)
GFR calc Af Amer: >60 mL/min (ref >60)
Glucose, Bld: 67 mg/dL (ref 65–99)
Potassium: 4.6 mmol/L (ref 3.5–5.1)
SODIUM: 142 mmol/L (ref 135–145)

## 2015-04-22 LAB — GLUCOSE, CAPILLARY: Glucose-Capillary: 77 mg/dL (ref 65–99)

## 2015-04-22 NOTE — Progress Notes (Signed)
This patient scored at an elevated risk for obstructive sleep apnea using the STOP BANG TOOL during a pre surgical testing 

## 2015-04-22 NOTE — Pre-Procedure Instructions (Addendum)
RODERICK CALO  04/22/2015      [id number] [place], [place] [address] [place] [address] Phone: [phone] Fax: [phone]  [facility] [place] [facility] [address] [place] [address] Phone: [phone] Fax: [phone]    Your procedure is scheduled on May 02, 2015.  Report to [facility] Admitting at 8 AM.  Call this number if you have problems the morning of surgery:  [phone]   Monday-Friday 8AM-4PM with questions prior to day of surgery call [phone].   Remember:  Do not eat food or drink liquids after midnight.  Take these medicines the morning of surgery with A SIP OF WATER : busPIRone (BUSPAR),  cetirizine (ZYRTEC), DEXILANT, mycophenolate (CELLCEPT), predniSONE (DELTASONE),  pyridostigmine (MESTINON), traMADol (ULTRAM)   STOP ASPIRIN, NSAIDS(ALEVE, IBUPROFEN) HERBAL MEDICATIONS 7 DAYS PRIOR TO  SURGERY   Do not wear jewelry.  Do not wear lotions, powders, or colognes.  You may wear deodorant.  Men may shave face and neck.  Do not bring valuables to the hospital.  [facility] is not responsible for any belongings or valuables.  Contacts, dentures or bridgework may not be worn into surgery.  Leave your suitcase in the car.  After surgery it may be brought to your room.  For patients admitted to the hospital, discharge time will be determined by your treatment team.  Patients discharged the day of surgery will not be allowed to drive home.   Name and phone number of your driver:    Special instructions:  Preparing for Surgery  Please read over the following fact sheets that you were given. Pain Booklet, Coughing and Deep Breathing and Surgical Site Infection Prevention        How to Manage Your Diabetes Before Surgery   Why is it important to control my blood sugar before and after  surgery?   Improving blood sugar levels before and after surgery helps healing and can limit problems.  A way of improving blood sugar control is eating a healthy diet by:  - Eating less sugar and carbohydrates  - Increasing activity/exercise  - Talk with your doctor about reaching your blood sugar goals  High blood sugars (greater than 180 mg/dL) can raise your risk of infections and slow down your recovery so you will need to focus on controlling your diabetes during the weeks before surgery.  Make sure that the doctor who takes care of your diabetes knows about your planned surgery including the date and location.  How do I manage my blood sugars before surgery?   Check your blood sugar at least 4 times a day, 2 days before surgery to make sure that they are not too high or low.   Check your blood sugar the morning of your surgery when you wake up and every 2               hours until you get to the Short-Stay unit.  If your blood sugar is less than 70 mg/dL, you will need to treat for low blood sugar by:  Treat a low blood sugar (less than 70 mg/dL) with 1/2 cup of clear juice (cranberry or apple), 4 glucose tablets, OR glucose gel.  Recheck blood sugar in 15 minutes after treatment (to make sure it is greater than 70 mg/dL).  If blood sugar is not greater than 70 mg/dL on re-check,  call [phone] for further instructions.   Report your blood sugar to the Short-Stay nurse when you get to Short-Stay.  References:  [facility], 2007 "How to Manage your Diabetes Before and After Surgery".  What do I do about my diabetes medications?   Do not take oral diabetes medicines (pills) the morning of surgery.  THE NIGHT BEFORE SURGERY, take 35 units of novolog 70/30 Insulin.    THE MORNING OF SURGERY, DO NOT TAKE INSULIN    Do not take other diabetes injectables the day of surgery including Byetta, Victoza, Bydureon, and Trulicity.    If your  CBG is greater than 220 mg/dL, you may take 1/2 of your sliding scale (correction) dose of insulin.   For patients with "Insulin Pumps":  Contact your diabetes doctor for specific instructions before surgery.   Decrease basal insulin rates by 20% at midnight the night before surgery.  Note that if your surgery is planned to be longer than 2 hours, your insulin pump will be removed and intravenous (IV) insulin will be started and managed by the nurses and anesthesiologist.  You will be able to restart your insulin pump once you are awake and able to manage it.  Make sure to bring insulin pump supplies to the hospital with you in case your site needs to be changed.

## 2015-04-23 LAB — HEMOGLOBIN A1C
HEMOGLOBIN A1C: 7.3 % — AB (ref 4.8–5.6)
Mean Plasma Glucose: 163 mg/dL

## 2015-04-25 NOTE — Progress Notes (Signed)
Anesthesia Chart Review:  Pt is 69 year old male scheduled for L3-L5 lumbar laminectomy/ decompression microdiscectomy on 05/02/2015 with Dr. Sherwood Gambler.   PMH includes: HTN, DM, hyperlipidemia, myasthenia gravis, vitamin B12 deficiency, anemia, GERD, colon cancer. Current smoker. BMI 42. S/p R carpal tunnel and cubital tunnel release 07/14/13. S/p L carpal tunnel with cubital tunnel release 06/02/13.   Medications include: lipitor, dexilant, novolog 70/30, invokana, lisinopril, metformin, cellcept, prednisone, pyridostigmine.   Preoperative labs reviewed.  HgbA1c 7.3. Glucose 67.  EKG 04/22/2015: NSR. Possible Anterior infarct, age undetermined. No significant change since 05/2013 per Dr. Sherryl Barters interpretation.   If no changes, I anticipate pt can proceed with surgery as scheduled.   Willeen Cass, FNP-BC [facility]/Anesthesiology Phone: [phone] 04/25/2015 3:24 PM

## 2015-04-29 ENCOUNTER — Ambulatory Visit (INDEPENDENT_AMBULATORY_CARE_PROVIDER_SITE_OTHER): Payer: 59

## 2015-04-29 DIAGNOSIS — E538 Deficiency of other specified B group vitamins: Secondary | ICD-10-CM

## 2015-04-29 MED ORDER — CYANOCOBALAMIN 1000 MCG/ML IJ SOLN
1000.0000 ug | Freq: Once | INTRAMUSCULAR | Status: AC
  Administered 2015-04-29: 1000 ug via INTRAMUSCULAR

## 2015-05-01 MED ORDER — DEXTROSE 5 % IV SOLN
3.0000 g | INTRAVENOUS | Status: AC
  Administered 2015-05-02: 3 g via INTRAVENOUS
  Filled 2015-05-01: qty 3000

## 2015-05-02 ENCOUNTER — Ambulatory Visit (HOSPITAL_COMMUNITY): Payer: 59 | Admitting: Anesthesiology

## 2015-05-02 ENCOUNTER — Ambulatory Visit: Payer: 59

## 2015-05-02 ENCOUNTER — Encounter (HOSPITAL_COMMUNITY): Payer: Self-pay | Admitting: *Deleted

## 2015-05-02 ENCOUNTER — Encounter (HOSPITAL_COMMUNITY)
Admission: RE | Disposition: A | Discharge status: Home / Self Care | Payer: Self-pay | Source: Ambulatory Visit | Attending: Neurosurgery

## 2015-05-02 ENCOUNTER — Ambulatory Visit (HOSPITAL_COMMUNITY): Payer: 59 | Admitting: Emergency Medicine

## 2015-05-02 ENCOUNTER — Ambulatory Visit (HOSPITAL_COMMUNITY)
Admission: RE | Admit: 2015-05-02 | Discharge: 2015-05-03 | Disposition: A | Discharge status: Home / Self Care | Payer: 59 | Source: Ambulatory Visit | Attending: Neurosurgery | Admitting: Neurosurgery

## 2015-05-02 ENCOUNTER — Ambulatory Visit (HOSPITAL_COMMUNITY): Payer: 59

## 2015-05-02 DIAGNOSIS — M4806 Spinal stenosis, lumbar region: Secondary | ICD-10-CM | POA: Insufficient documentation

## 2015-05-02 DIAGNOSIS — M5136 Other intervertebral disc degeneration, lumbar region: Secondary | ICD-10-CM | POA: Insufficient documentation

## 2015-05-02 DIAGNOSIS — E119 Type 2 diabetes mellitus without complications: Secondary | ICD-10-CM | POA: Insufficient documentation

## 2015-05-02 DIAGNOSIS — Z85038 Personal history of other malignant neoplasm of large intestine: Secondary | ICD-10-CM | POA: Insufficient documentation

## 2015-05-02 DIAGNOSIS — Z79899 Other long term (current) drug therapy: Secondary | ICD-10-CM | POA: Diagnosis not present

## 2015-05-02 DIAGNOSIS — M1990 Unspecified osteoarthritis, unspecified site: Secondary | ICD-10-CM | POA: Insufficient documentation

## 2015-05-02 DIAGNOSIS — K219 Gastro-esophageal reflux disease without esophagitis: Secondary | ICD-10-CM | POA: Insufficient documentation

## 2015-05-02 DIAGNOSIS — E785 Hyperlipidemia, unspecified: Secondary | ICD-10-CM | POA: Diagnosis not present

## 2015-05-02 DIAGNOSIS — D649 Anemia, unspecified: Secondary | ICD-10-CM | POA: Insufficient documentation

## 2015-05-02 DIAGNOSIS — G7000 Myasthenia gravis without (acute) exacerbation: Secondary | ICD-10-CM | POA: Insufficient documentation

## 2015-05-02 DIAGNOSIS — Z794 Long term (current) use of insulin: Secondary | ICD-10-CM | POA: Diagnosis not present

## 2015-05-02 DIAGNOSIS — M47816 Spondylosis without myelopathy or radiculopathy, lumbar region: Secondary | ICD-10-CM | POA: Diagnosis not present

## 2015-05-02 DIAGNOSIS — I10 Essential (primary) hypertension: Secondary | ICD-10-CM | POA: Insufficient documentation

## 2015-05-02 DIAGNOSIS — Z7952 Long term (current) use of systemic steroids: Secondary | ICD-10-CM | POA: Diagnosis not present

## 2015-05-02 DIAGNOSIS — F17290 Nicotine dependence, other tobacco product, uncomplicated: Secondary | ICD-10-CM | POA: Insufficient documentation

## 2015-05-02 DIAGNOSIS — M48062 Spinal stenosis, lumbar region with neurogenic claudication: Secondary | ICD-10-CM | POA: Diagnosis present

## 2015-05-02 DIAGNOSIS — M549 Dorsalgia, unspecified: Secondary | ICD-10-CM

## 2015-05-02 HISTORY — PX: LUMBAR LAMINECTOMY/DECOMPRESSION MICRODISCECTOMY: SHX5026

## 2015-05-02 LAB — GLUCOSE, CAPILLARY
Glucose-Capillary: 110 mg/dL — ABNORMAL HIGH (ref 65–99)
Glucose-Capillary: 146 mg/dL — ABNORMAL HIGH (ref 65–99)
Glucose-Capillary: 217 mg/dL — ABNORMAL HIGH (ref 65–99)
Glucose-Capillary: 238 mg/dL — ABNORMAL HIGH (ref 65–99)
Glucose-Capillary: 255 mg/dL — ABNORMAL HIGH (ref 65–99)

## 2015-05-02 SURGERY — LUMBAR LAMINECTOMY/DECOMPRESSION MICRODISCECTOMY 2 LEVELS
Anesthesia: General | Site: Back

## 2015-05-02 MED ORDER — MORPHINE SULFATE (PF) 4 MG/ML IV SOLN: 4.0000 mg | INTRAVENOUS | Status: DC | PRN

## 2015-05-02 MED ORDER — LIDOCAINE HCL (CARDIAC) 20 MG/ML IV SOLN
INTRAVENOUS | Status: AC
  Filled 2015-05-02: qty 20

## 2015-05-02 MED ORDER — PHENYLEPHRINE 40 MCG/ML (10ML) SYRINGE FOR IV PUSH (FOR BLOOD PRESSURE SUPPORT)
PREFILLED_SYRINGE | INTRAVENOUS | Status: AC
  Filled 2015-05-02: qty 10

## 2015-05-02 MED ORDER — SUCCINYLCHOLINE CHLORIDE 20 MG/ML IJ SOLN
INTRAMUSCULAR | Status: AC
  Filled 2015-05-02: qty 1

## 2015-05-02 MED ORDER — ACETAMINOPHEN 325 MG PO TABS: 650.0000 mg | ORAL_TABLET | ORAL | Status: DC | PRN

## 2015-05-02 MED ORDER — KETOROLAC TROMETHAMINE 30 MG/ML IJ SOLN
INTRAMUSCULAR | Status: AC
  Filled 2015-05-02: qty 1

## 2015-05-02 MED ORDER — SODIUM CHLORIDE 0.9 % IJ SOLN
INTRAMUSCULAR | Status: AC
  Filled 2015-05-02: qty 10

## 2015-05-02 MED ORDER — SODIUM CHLORIDE 0.9 % IJ SOLN: 3.0000 mL | INTRAMUSCULAR | Status: DC | PRN

## 2015-05-02 MED ORDER — EPHEDRINE SULFATE 50 MG/ML IJ SOLN
INTRAMUSCULAR | Status: AC
  Filled 2015-05-02: qty 1

## 2015-05-02 MED ORDER — INSULIN ASPART 100 UNIT/ML ~~LOC~~ SOLN
0.0000 [IU] | Freq: Every day | SUBCUTANEOUS | Status: DC
  Administered 2015-05-02: 3 [IU] via SUBCUTANEOUS

## 2015-05-02 MED ORDER — ONDANSETRON HCL 4 MG/2ML IJ SOLN: 4.0000 mg | Freq: Once | INTRAMUSCULAR | Status: DC | PRN

## 2015-05-02 MED ORDER — BUPIVACAINE HCL (PF) 0.5 % IJ SOLN
INTRAMUSCULAR | Status: DC | PRN
  Administered 2015-05-02: 15 mL

## 2015-05-02 MED ORDER — 0.9 % SODIUM CHLORIDE (POUR BTL) OPTIME
TOPICAL | Status: DC | PRN
  Administered 2015-05-02: 1000 mL

## 2015-05-02 MED ORDER — LORATADINE 10 MG PO TABS
10.0000 mg | ORAL_TABLET | Freq: Every day | ORAL | Status: DC
  Administered 2015-05-03: 10 mg via ORAL
  Filled 2015-05-02 (×2): qty 1

## 2015-05-02 MED ORDER — ONDANSETRON HCL 4 MG/2ML IJ SOLN: 4.0000 mg | Freq: Four times a day (QID) | INTRAMUSCULAR | Status: DC | PRN

## 2015-05-02 MED ORDER — EPHEDRINE SULFATE 50 MG/ML IJ SOLN
INTRAMUSCULAR | Status: DC | PRN
  Administered 2015-05-02 (×5): 10 mg via INTRAVENOUS

## 2015-05-02 MED ORDER — INSULIN ASPART PROT & ASPART (70-30 MIX) 100 UNIT/ML ~~LOC~~ SUSP
18.0000 [IU] | Freq: Every day | SUBCUTANEOUS | Status: DC
  Filled 2015-05-02: qty 10

## 2015-05-02 MED ORDER — PROPOFOL 10 MG/ML IV BOLUS
INTRAVENOUS | Status: DC | PRN
  Administered 2015-05-02 (×2): 20 mg via INTRAVENOUS
  Administered 2015-05-02: 300 mg via INTRAVENOUS
  Administered 2015-05-02: 20 mg via INTRAVENOUS

## 2015-05-02 MED ORDER — ACETAMINOPHEN 10 MG/ML IV SOLN
INTRAVENOUS | Status: AC
  Administered 2015-05-02: 1000 mg via INTRAVENOUS
  Filled 2015-05-02: qty 100

## 2015-05-02 MED ORDER — ONDANSETRON HCL 4 MG/2ML IJ SOLN
INTRAMUSCULAR | Status: DC | PRN
  Administered 2015-05-02: 4 mg via INTRAVENOUS

## 2015-05-02 MED ORDER — SODIUM CHLORIDE 0.9 % IJ SOLN
INTRAMUSCULAR | Status: AC
  Filled 2015-05-02: qty 20

## 2015-05-02 MED ORDER — METFORMIN HCL 500 MG PO TABS
1000.0000 mg | ORAL_TABLET | Freq: Every day | ORAL | Status: DC
  Administered 2015-05-02: 1000 mg via ORAL
  Filled 2015-05-02 (×2): qty 2

## 2015-05-02 MED ORDER — MENTHOL 3 MG MT LOZG: 1.0000 | LOZENGE | OROMUCOSAL | Status: DC | PRN

## 2015-05-02 MED ORDER — CANAGLIFLOZIN 300 MG PO TABS
300.0000 mg | ORAL_TABLET | Freq: Every day | ORAL | Status: DC
  Administered 2015-05-03: 300 mg via ORAL
  Filled 2015-05-02: qty 300

## 2015-05-02 MED ORDER — BISACODYL 10 MG RE SUPP: 10.0000 mg | Freq: Every day | RECTAL | Status: DC | PRN

## 2015-05-02 MED ORDER — THROMBIN 5000 UNITS EX SOLR
CUTANEOUS | Status: DC | PRN
  Administered 2015-05-02: 5000 [IU] via TOPICAL

## 2015-05-02 MED ORDER — LIDOCAINE-EPINEPHRINE 1 %-1:100000 IJ SOLN
INTRAMUSCULAR | Status: DC | PRN
  Administered 2015-05-02: 15 mL

## 2015-05-02 MED ORDER — SODIUM CHLORIDE 0.9 % IJ SOLN
3.0000 mL | Freq: Two times a day (BID) | INTRAMUSCULAR | Status: DC
  Administered 2015-05-02: 3 mL via INTRAVENOUS

## 2015-05-02 MED ORDER — INSULIN ASPART 100 UNIT/ML ~~LOC~~ SOLN: 0.0000 [IU] | Freq: Three times a day (TID) | SUBCUTANEOUS | Status: DC

## 2015-05-02 MED ORDER — LACTATED RINGERS IV SOLN
INTRAVENOUS | Status: DC
  Administered 2015-05-02 (×3): via INTRAVENOUS

## 2015-05-02 MED ORDER — OXYCODONE-ACETAMINOPHEN 5-325 MG PO TABS: 1.0000 | ORAL_TABLET | ORAL | Status: DC | PRN

## 2015-05-02 MED ORDER — SODIUM CHLORIDE 0.9 % IV SOLN: INTRAVENOUS | Status: DC

## 2015-05-02 MED ORDER — ROCURONIUM BROMIDE 50 MG/5ML IV SOLN
INTRAVENOUS | Status: AC
  Filled 2015-05-02: qty 2

## 2015-05-02 MED ORDER — OXYCODONE HCL 5 MG/5ML PO SOLN: 5.0000 mg | Freq: Once | ORAL | Status: DC | PRN

## 2015-05-02 MED ORDER — DEXAMETHASONE SODIUM PHOSPHATE 10 MG/ML IJ SOLN
INTRAMUSCULAR | Status: AC
  Filled 2015-05-02: qty 1

## 2015-05-02 MED ORDER — INSULIN ASPART PROT & ASPART (70-30 MIX) 100 UNIT/ML ~~LOC~~ SUSP
48.0000 [IU] | Freq: Two times a day (BID) | SUBCUTANEOUS | Status: DC
  Administered 2015-05-02 – 2015-05-03 (×2): 48 [IU] via SUBCUTANEOUS
  Filled 2015-05-02: qty 10

## 2015-05-02 MED ORDER — LISINOPRIL 5 MG PO TABS
5.0000 mg | ORAL_TABLET | Freq: Every day | ORAL | Status: DC
  Administered 2015-05-03: 5 mg via ORAL
  Filled 2015-05-02: qty 1

## 2015-05-02 MED ORDER — PREDNISONE 1 MG PO TABS
3.0000 mg | ORAL_TABLET | Freq: Every day | ORAL | Status: DC
  Administered 2015-05-03: 3 mg via ORAL
  Filled 2015-05-02 (×2): qty 3

## 2015-05-02 MED ORDER — ATORVASTATIN CALCIUM 40 MG PO TABS
40.0000 mg | ORAL_TABLET | Freq: Every day | ORAL | Status: DC
  Administered 2015-05-03: 40 mg via ORAL
  Filled 2015-05-02: qty 2
  Filled 2015-05-02: qty 1

## 2015-05-02 MED ORDER — DEXAMETHASONE SODIUM PHOSPHATE 10 MG/ML IJ SOLN
INTRAMUSCULAR | Status: DC | PRN
  Administered 2015-05-02: 10 mg via INTRAVENOUS

## 2015-05-02 MED ORDER — PHENYLEPHRINE HCL 10 MG/ML IJ SOLN
INTRAMUSCULAR | Status: DC | PRN
  Administered 2015-05-02: 80 ug via INTRAVENOUS
  Administered 2015-05-02: 240 ug via INTRAVENOUS

## 2015-05-02 MED ORDER — KETOROLAC TROMETHAMINE 30 MG/ML IJ SOLN
30.0000 mg | Freq: Once | INTRAMUSCULAR | Status: AC
  Administered 2015-05-02: 30 mg via INTRAVENOUS

## 2015-05-02 MED ORDER — CYCLOBENZAPRINE HCL 10 MG PO TABS
10.0000 mg | ORAL_TABLET | Freq: Three times a day (TID) | ORAL | Status: DC | PRN
  Administered 2015-05-03: 10 mg via ORAL
  Filled 2015-05-02: qty 1

## 2015-05-02 MED ORDER — ALUM & MAG HYDROXIDE-SIMETH 200-200-20 MG/5ML PO SUSP: 30.0000 mL | Freq: Four times a day (QID) | ORAL | Status: DC | PRN

## 2015-05-02 MED ORDER — LIDOCAINE HCL (CARDIAC) 20 MG/ML IV SOLN
INTRAVENOUS | Status: DC | PRN
  Administered 2015-05-02: 100 mg via INTRAVENOUS

## 2015-05-02 MED ORDER — FENTANYL CITRATE (PF) 250 MCG/5ML IJ SOLN
INTRAMUSCULAR | Status: AC
  Filled 2015-05-02: qty 5

## 2015-05-02 MED ORDER — THROMBIN 20000 UNITS EX SOLR
CUTANEOUS | Status: DC | PRN
  Administered 2015-05-02: 20 mL via TOPICAL

## 2015-05-02 MED ORDER — ACETAMINOPHEN 650 MG RE SUPP: 650.0000 mg | RECTAL | Status: DC | PRN

## 2015-05-02 MED ORDER — PROPOFOL 10 MG/ML IV BOLUS
INTRAVENOUS | Status: AC
  Filled 2015-05-02: qty 20

## 2015-05-02 MED ORDER — OCUVITE PO TABS
1.0000 | ORAL_TABLET | Freq: Every day | ORAL | Status: DC
  Administered 2015-05-03: 1 via ORAL
  Filled 2015-05-02: qty 1

## 2015-05-02 MED ORDER — SODIUM CHLORIDE 0.9 % IV SOLN: 250.0000 mL | INTRAVENOUS | Status: DC

## 2015-05-02 MED ORDER — HYDROMORPHONE HCL 1 MG/ML IJ SOLN
0.2500 mg | INTRAMUSCULAR | Status: DC | PRN
  Administered 2015-05-02 (×3): 0.5 mg via INTRAVENOUS

## 2015-05-02 MED ORDER — MAGNESIUM HYDROXIDE 400 MG/5ML PO SUSP: 30.0000 mL | Freq: Every day | ORAL | Status: DC | PRN

## 2015-05-02 MED ORDER — ONDANSETRON HCL 4 MG PO TABS
4.0000 mg | ORAL_TABLET | Freq: Four times a day (QID) | ORAL | Status: DC | PRN
Start: 2015-05-02 — End: 2015-05-03

## 2015-05-02 MED ORDER — OXYCODONE HCL 5 MG PO TABS: 5.0000 mg | ORAL_TABLET | Freq: Once | ORAL | Status: DC | PRN

## 2015-05-02 MED ORDER — NEOSTIGMINE METHYLSULFATE 10 MG/10ML IV SOLN
INTRAVENOUS | Status: AC
  Filled 2015-05-02: qty 1

## 2015-05-02 MED ORDER — PANTOPRAZOLE SODIUM 40 MG PO TBEC
40.0000 mg | DELAYED_RELEASE_TABLET | Freq: Every day | ORAL | Status: DC
  Administered 2015-05-03: 40 mg via ORAL
  Filled 2015-05-02: qty 1

## 2015-05-02 MED ORDER — BUSPIRONE HCL 15 MG PO TABS
7.5000 mg | ORAL_TABLET | Freq: Two times a day (BID) | ORAL | Status: DC
  Administered 2015-05-02 – 2015-05-03 (×2): 7.5 mg via ORAL
  Filled 2015-05-02 (×4): qty 1

## 2015-05-02 MED ORDER — FENTANYL CITRATE (PF) 100 MCG/2ML IJ SOLN
INTRAMUSCULAR | Status: DC | PRN
  Administered 2015-05-02 (×2): 25 ug via INTRAVENOUS
  Administered 2015-05-02: 50 ug via INTRAVENOUS
  Administered 2015-05-02: 150 ug via INTRAVENOUS

## 2015-05-02 MED ORDER — ARTIFICIAL TEARS OP OINT
TOPICAL_OINTMENT | OPHTHALMIC | Status: AC
  Filled 2015-05-02: qty 7

## 2015-05-02 MED ORDER — DEXTROSE 5 % IV SOLN
10.0000 mg | INTRAVENOUS | Status: DC | PRN
  Administered 2015-05-02: 30 ug/min via INTRAVENOUS

## 2015-05-02 MED ORDER — HYDROMORPHONE HCL 1 MG/ML IJ SOLN
INTRAMUSCULAR | Status: AC
  Filled 2015-05-02: qty 1

## 2015-05-02 MED ORDER — HYDROXYZINE HCL 25 MG PO TABS: 50.0000 mg | ORAL_TABLET | ORAL | Status: DC | PRN

## 2015-05-02 MED ORDER — MYCOPHENOLATE MOFETIL 250 MG PO CAPS
1000.0000 mg | ORAL_CAPSULE | Freq: Two times a day (BID) | ORAL | Status: DC
  Administered 2015-05-02 – 2015-05-03 (×2): 1000 mg via ORAL
  Filled 2015-05-02 (×3): qty 4

## 2015-05-02 MED ORDER — PHENOL 1.4 % MT LIQD: 1.0000 | OROMUCOSAL | Status: DC | PRN

## 2015-05-02 MED ORDER — HYDROCODONE-ACETAMINOPHEN 5-325 MG PO TABS
1.0000 | ORAL_TABLET | ORAL | Status: DC | PRN
  Administered 2015-05-02 – 2015-05-03 (×3): 2 via ORAL
  Filled 2015-05-02 (×3): qty 2

## 2015-05-02 MED ORDER — ARTIFICIAL TEARS OP OINT
TOPICAL_OINTMENT | OPHTHALMIC | Status: DC | PRN
  Administered 2015-05-02: 1 via OPHTHALMIC

## 2015-05-02 MED ORDER — GLYCOPYRROLATE 0.2 MG/ML IJ SOLN
INTRAMUSCULAR | Status: AC
  Filled 2015-05-02: qty 3

## 2015-05-02 MED ORDER — KETOROLAC TROMETHAMINE 30 MG/ML IJ SOLN
30.0000 mg | Freq: Four times a day (QID) | INTRAMUSCULAR | Status: DC
  Administered 2015-05-02 – 2015-05-03 (×3): 30 mg via INTRAVENOUS
  Filled 2015-05-02 (×7): qty 1

## 2015-05-02 MED ORDER — SODIUM CHLORIDE 0.9 % IR SOLN
Status: DC | PRN
  Administered 2015-05-02: 500 mL

## 2015-05-02 MED ORDER — ONDANSETRON HCL 4 MG/2ML IJ SOLN
INTRAMUSCULAR | Status: AC
  Filled 2015-05-02: qty 4

## 2015-05-02 MED ORDER — HYDROXYZINE HCL 50 MG/ML IM SOLN
50.0000 mg | INTRAMUSCULAR | Status: DC | PRN
  Filled 2015-05-02: qty 1

## 2015-05-02 MED ORDER — PYRIDOSTIGMINE BROMIDE 60 MG PO TABS
30.0000 mg | ORAL_TABLET | Freq: Three times a day (TID) | ORAL | Status: DC
  Administered 2015-05-02 – 2015-05-03 (×2): 30 mg via ORAL
  Filled 2015-05-02 (×5): qty 0.5

## 2015-05-02 MED ORDER — HEMOSTATIC AGENTS (NO CHARGE) OPTIME
TOPICAL | Status: DC | PRN
  Administered 2015-05-02: 1 via TOPICAL

## 2015-05-02 SURGICAL SUPPLY — 57 items
ADH SKN CLS APL DERMABOND .7 (GAUZE/BANDAGES/DRESSINGS)
ADH SKN CLS LQ APL DERMABOND (GAUZE/BANDAGES/DRESSINGS) ×1
APL SKNCLS STERI-STRIP NONHPOA (GAUZE/BANDAGES/DRESSINGS)
BAG DECANTER FOR FLEXI CONT (MISCELLANEOUS) ×2 IMPLANT
BENZOIN TINCTURE PRP APPL 2/3 (GAUZE/BANDAGES/DRESSINGS) IMPLANT
BLADE CLIPPER SURG (BLADE) IMPLANT
BRUSH SCRUB EZ PLAIN DRY (MISCELLANEOUS) ×2 IMPLANT
BUR ACORN 6.0 ACORN (BURR) IMPLANT
BUR ACRON 5.0MM COATED (BURR) IMPLANT
BUR MATCHSTICK NEURO 3.0 LAGG (BURR) ×2 IMPLANT
CANISTER SUCT 3000ML PPV (MISCELLANEOUS) ×2 IMPLANT
DERMABOND ADHESIVE PROPEN (GAUZE/BANDAGES/DRESSINGS) ×1
DERMABOND ADVANCED (GAUZE/BANDAGES/DRESSINGS)
DERMABOND ADVANCED .7 DNX12 (GAUZE/BANDAGES/DRESSINGS) IMPLANT
DERMABOND ADVANCED .7 DNX6 (GAUZE/BANDAGES/DRESSINGS) ×1 IMPLANT
DRAPE LAPAROTOMY 100X72X124 (DRAPES) ×2 IMPLANT
DRAPE MICROSCOPE LEICA (MISCELLANEOUS) ×2 IMPLANT
DRAPE POUCH INSTRU U-SHP 10X18 (DRAPES) ×2 IMPLANT
DRSG EMULSION OIL 3X3 NADH (GAUZE/BANDAGES/DRESSINGS) IMPLANT
ELECT REM PT RETURN 9FT ADLT (ELECTROSURGICAL) ×2
ELECTRODE REM PT RTRN 9FT ADLT (ELECTROSURGICAL) ×1 IMPLANT
GAUZE SPONGE 4X4 12PLY STRL (GAUZE/BANDAGES/DRESSINGS) IMPLANT
GAUZE SPONGE 4X4 16PLY XRAY LF (GAUZE/BANDAGES/DRESSINGS) IMPLANT
GLOVE BIOGEL PI IND STRL 8 (GLOVE) ×1 IMPLANT
GLOVE BIOGEL PI INDICATOR 8 (GLOVE) ×1
GLOVE ECLIPSE 7.5 STRL STRAW (GLOVE) ×2 IMPLANT
GLOVE EXAM NITRILE LRG STRL (GLOVE) IMPLANT
GLOVE EXAM NITRILE MD LF STRL (GLOVE) IMPLANT
GLOVE EXAM NITRILE XL STR (GLOVE) IMPLANT
GLOVE EXAM NITRILE XS STR PU (GLOVE) IMPLANT
GOWN STRL REUS W/ TWL LRG LVL3 (GOWN DISPOSABLE) ×1 IMPLANT
GOWN STRL REUS W/ TWL XL LVL3 (GOWN DISPOSABLE) IMPLANT
GOWN STRL REUS W/TWL 2XL LVL3 (GOWN DISPOSABLE) IMPLANT
GOWN STRL REUS W/TWL LRG LVL3 (GOWN DISPOSABLE) ×2
GOWN STRL REUS W/TWL XL LVL3 (GOWN DISPOSABLE)
KIT BASIN OR (CUSTOM PROCEDURE TRAY) ×2 IMPLANT
KIT ROOM TURNOVER OR (KITS) ×2 IMPLANT
NEEDLE HYPO 18GX1.5 BLUNT FILL (NEEDLE) IMPLANT
NEEDLE SPNL 18GX3.5 QUINCKE PK (NEEDLE) ×2 IMPLANT
NEEDLE SPNL 22GX3.5 QUINCKE BK (NEEDLE) ×2 IMPLANT
NS IRRIG 1000ML POUR BTL (IV SOLUTION) ×2 IMPLANT
PACK LAMINECTOMY NEURO (CUSTOM PROCEDURE TRAY) ×2 IMPLANT
PAD ARMBOARD 7.5X6 YLW CONV (MISCELLANEOUS) ×6 IMPLANT
PATTIES SURGICAL .5 X1 (DISPOSABLE) ×2 IMPLANT
RUBBERBAND STERILE (MISCELLANEOUS) ×4 IMPLANT
SPONGE LAP 4X18 X RAY DECT (DISPOSABLE) IMPLANT
SPONGE SURGIFOAM ABS GEL 100 (HEMOSTASIS) ×2 IMPLANT
STRIP CLOSURE SKIN 1/2X4 (GAUZE/BANDAGES/DRESSINGS) IMPLANT
SUT PROLENE 6 0 BV (SUTURE) IMPLANT
SUT VIC AB 1 CT1 18XBRD ANBCTR (SUTURE) ×2 IMPLANT
SUT VIC AB 1 CT1 8-18 (SUTURE) ×4
SUT VIC AB 2-0 CP2 18 (SUTURE) ×4 IMPLANT
SUT VIC AB 3-0 SH 8-18 (SUTURE) IMPLANT
SYR 5ML LL (SYRINGE) IMPLANT
TOWEL OR 17X24 6PK STRL BLUE (TOWEL DISPOSABLE) ×2 IMPLANT
TOWEL OR 17X26 10 PK STRL BLUE (TOWEL DISPOSABLE) ×2 IMPLANT
WATER STERILE IRR 1000ML POUR (IV SOLUTION) ×2 IMPLANT

## 2015-05-02 NOTE — Anesthesia Procedure Notes (Signed)
Procedure Name: Intubation Date/Time: 05/02/2015 11:08 AM Performed by: AUSTON, AMANDA M Pre-anesthesia Checklist: Patient identified, Timeout performed, Emergency Drugs available, Suction available and Patient being monitored Patient Re-evaluated:Patient Re-evaluated prior to inductionOxygen Delivery Method: Circle system utilized Preoxygenation: Pre-oxygenation with 100% oxygen Intubation Type: IV induction Ventilation: Oral airway inserted - appropriate to patient size and Mask ventilation with difficulty Laryngoscope Size: Miller, 2 and Glidescope Grade View: Grade II Tube type: Oral Tube size: 7.5 mm Number of attempts: 2 Airway Equipment and Method: Stylet,  Video-laryngoscopy and LTA kit utilized Placement Confirmation: ETT inserted through vocal cords under direct vision,  breath sounds checked- equal and bilateral and positive ETCO2 Secured at: 24 cm Tube secured with: Tape Dental Injury: Teeth and Oropharynx as per pre-operative assessment  Difficulty Due To: Difficulty was anticipated Comments: Patient induced without muscle relaxant due to Myasthenia.  Acceptable mask airway with oral airway in place.  Gr 2 view with Miller 2, however patient coughing and unable to easily pass tube.  Deepened with additional propofol and Sevoflurane; then used glidescope to intubate.  Patient's 02 sats remained 100% throughout procedure.  Teeth, lips unchanged.     

## 2015-05-02 NOTE — Op Note (Signed)
05/02/2015  1:46 PM  PATIENT:  Joshua [name]  69 y.o. male  PRE-OPERATIVE DIAGNOSIS:  lumbar stenosis with neurgenic claudication, lumbar spondylosis, lumbar degenerative disc disease  POST-OPERATIVE DIAGNOSIS:  lumbar stenosis with neurgenic claudication, lumbar spondylosis, lumbar degenerative disc disease  PROCEDURE:  Procedure(s):  L3, L4, and L5 decompressive lumbar laminectomy with medial facetectomy, with decompression of the exiting L3, L4, and L5 nerve roots bilaterally  SURGEON:  Surgeon(s): Jovita Gamma, MD Kary Kos, MD  ASSISTANTS: Kary Kos, M.D.  ANESTHESIA:   general  EBL:  Total I/O In: 1000 [I.V.:1000] Out: 100 [Blood:100]  BLOOD ADMINISTERED:none  COUNT: Correct per nursing staff  DICTATION: Patient was brought to the operating room placed under general endotracheal anesthesia. Patient was turned to a prone position the lumbar region was prepped with Betadine soap and solution and draped in a sterile fashion. The midline was infiltrated with local anesthetic with epinephrine. A localizing x-ray was taken and then a midline incision was made carried down thru the subcutaneous tissue, bipolar cautery and electrocautery were used to maintain hemostasis. Dissection was carried down to the lumbar fascia which was incised bilaterally and the paraspinal muscles were dissected from the spinous process and lamina in a subperiosteal fashion. Another localizing x-ray was taken and the L3, L4, and L5 levels were identified. Laminectomy was begun with double-action rongeurs, and continued with a high-speed drill and Kerrison punches. The thickened ligamentum flavum was carefully removed. Dissection was carried laterally with a limited medial facetectomy,  to decompress the lateral stenosis taking care to leave the facet complexes intact.  Foraminotomies performed bilaterally so as to decompress the exiting L3, L4, and L5 nerve roots bilaterally.  Once the decompression was  completed hemostasis was established with the use of bipolar cautery and Gelfoam with thrombin. Paraspinal muscles, deep fascia, and Scarpa's fascia were closed in separate layers with interrupted 1 undyed Vicryl sutures. The subcutaneous and subcuticular were closed with interrupted inverted 2-0 undyed Vicryl sutures. Skin edges were approximated with Dermabond. A dressing of sterile gauze and Hypafix was applied. Following surgery the patient was turned back to a supine position, to be reversed from the anesthetic, extubated, and transferred to the recovery room for further care.  PLAN OF CARE: Admit for overnight observation  PATIENT DISPOSITION:  PACU - hemodynamically stable.   Delay start of Pharmacological VTE agent (>24hrs) due to surgical blood loss or risk of bleeding:  yes

## 2015-05-02 NOTE — H&P (Signed)
Subjective: Patient is a 69 y.o. right-handed white male who is admitted for treatment of multilevel, multifactorial lumbar stenosis.  Patient's had difficulties with neurogenic claudication for the past 10 years, with pain across low back, radiating down through the lower extremities bilaterally, associated with numbness through the lower extremities, and a tendency for the lower extremities to fatigue with walking and activity. Patient's been treated with spinal injections, facet blocks, facet rhizotomies, and medications without lasting relief. Patient is admitted now for an L3-L5 decompressive lumbar laminectomy.  Patient Active Problem List   Diagnosis Date Noted  . Chronic low back pain 12/07/2014  . Myasthenia gravis without exacerbation 05/19/2013  . Carpal tunnel syndrome 05/13/2013  . Vitamin B 12 deficiency 05/13/2013  . Myasthenia gravis with exacerbation, adult form 05/13/2013  . Unspecified ptosis of eyelid 05/13/2013  . Dysphonia 05/13/2013  . Shortness of breath 05/13/2013  . GERD (gastroesophageal reflux disease) 05/13/2013  . HEMORRHOIDS-INTERNAL 02/22/2009  . RECTAL BLEEDING 02/22/2009  . PERSONAL HX COLON CANCER 02/22/2009  . ADENOCARCINOMA, COLON 02/21/2009  . DM 02/21/2009  . HYPERLIPIDEMIA 02/21/2009  . HEMORRHOIDS 02/21/2009  . DIVERTICULOSIS, COLON 02/21/2009  . HYPERTENSION NEC 02/21/2009   Past Medical History  Diagnosis Date  . Colon cancer   . Diabetes   . Hypertension   . Hyperlipidemia   . Morbid obesity   . Anemia   . Diverticulosis   . Hemorrhoids   . Colon polyps   . DJD (degenerative joint disease)   . Anxiety   . Arthritis   . Basal cell carcinoma   . Vitamin B12 deficiency   . Chronic low back pain 12/07/2014  . GERD (gastroesophageal reflux disease)   . Myasthenia gravis   . Myasthenia gravis without exacerbation 05/19/2013  . Carpal tunnel syndrome, bilateral     Past Surgical History  Procedure Laterality Date  . Right colectomy   2006  . Patella fracture surgery Left 2011  . Ankle arthroplasty Right 1969  . Tonsillectomy  1968  . Basal cell carcinoma excision      face  . Carpal tunnel with cubital tunnel Left 06/02/2013    Procedure: CARPAL TUNNEL WITH CUBITAL TUNNEL;  Surgeon: Tennis Must, MD;  Location: [place];  Service: Orthopedics;  Laterality: Left;  . Carpal tunnel with cubital tunnel Right 07/14/2013    Procedure: RIGHT CARPAL TUNNEL AND CUBITAL TUNNEL RELEASE;  Surgeon: Tennis Must, MD;  Location: [place];  Service: Orthopedics;  Laterality: Right;    Facility-administered medications prior to admission  Medication Dose Route Frequency Provider Last Rate Last Dose  . cyanocobalamin ((VITAMIN B-12)) injection 1,000 mcg  1,000 mcg Intramuscular Q30 days Kathrynn Ducking, MD   1,000 mcg at 03/31/15 1628   Prescriptions prior to admission  Medication Sig Dispense Refill Last Dose  . atorvastatin (LIPITOR) 40 MG tablet Take 40 mg by mouth daily.   05/01/2015 at Unknown time  . B-D ULTRAFINE III SHORT PEN 31G X 8 MM MISC 200 each by Other route 3 (three) times daily between meals.   05/01/2015 at Unknown time  . busPIRone (BUSPAR) 7.5 MG tablet Take 1 tablet (7.5 mg total) by mouth 2 (two) times daily. 180 tablet 3 05/02/2015 at Unknown time  . cetirizine (ZYRTEC) 10 MG tablet Take 10 mg by mouth daily.   05/02/2015 at Unknown time  . Cyanocobalamin (VITAMIN B-12 IJ) Inject 1,000 mcg as directed every 30 (thirty) days.   04/29/2015  . DEXILANT 60  MG capsule Take 60 mg by mouth daily.    05/01/2015 at Unknown time  . Dextromethorphan-Guaifenesin (ROBITUSSIN DM PO) Take 1 tablet by mouth 2 (two) times daily as needed (cold and sinus).   05/01/2015 at Unknown time  . insulin aspart protamine-insulin aspart (NOVOLOG 70/30) (70-30) 100 UNIT/ML injection Inject 18-48 Units into the skin 3 (three) times daily. 48 units with breakfast and before bed. 18 units with lunch   05/01/2015 at  Unknown time  . INVOKANA 300 MG TABS Take 1 tablet by mouth daily.    05/01/2015 at Unknown time  . lisinopril (PRINIVIL,ZESTRIL) 5 MG tablet Take 5 mg by mouth daily.   05/02/2015 at Unknown time  . metFORMIN (GLUCOPHAGE) 1000 MG tablet Take 1,000 mg by mouth at bedtime.    05/01/2015 at Unknown time  . Multiple Vitamins-Minerals (OCUVITE PO) Take 1 tablet by mouth daily.   05/01/2015 at Unknown time  . mycophenolate (CELLCEPT) 500 MG tablet TAKE 2 TABLETS BY MOUTH TWICE A DAY 360 tablet 0 05/02/2015 at Unknown time  . ONE TOUCH ULTRA TEST test strip 200 each by Other route 3 (three) times daily.   05/01/2015 at Unknown time  . ONETOUCH DELICA LANCETS 10X MISC 100 each by Other route 3 (three) times daily.   05/01/2015 at Unknown time  . predniSONE (DELTASONE) 1 MG tablet BEGIN 4 TABLETS DAILY, TAPER BY ONE TABLET EVERY 2 MONTHS UNTIL OFF OF THE MEDICATION (Patient taking differently: BEGIN 3 TABLETS DAILY, TAPER BY ONE TABLET EVERY 2 MONTHS UNTIL OFF OF THE MEDICATION) 360 tablet 0 05/02/2015 at Unknown time  . pyridostigmine (MESTINON) 60 MG tablet TAKE 0.5 TABLETS (30 MG TOTAL) BY MOUTH 3 (THREE) TIMES DAILY. 135 tablet 1 05/02/2015 at Unknown time  . traMADol (ULTRAM) 50 MG tablet Take 100 mg by mouth 3 (three) times daily.    05/02/2015 at Unknown time   Allergies  Allergen Reactions  . Aluminum Chlorohydrate Rash  . Niacin And Related Rash    Social History  Substance Use Topics  . Smoking status: Current Every Day Smoker    Types: Cigars  . Smokeless tobacco: Never Used  . Alcohol Use: Yes     Comment: 6 pack a year    Family History  Problem Relation Age of Onset  . Prostate cancer Maternal Grandfather   . Colon polyps Maternal Grandfather   . Heart disease Mother   . Colon cancer Neg Hx      Review of Systems A comprehensive review of systems was negative.  Objective: Vital signs in last 24 hours: Temp:  [97.4 F (36.3 C)] 97.4 F (36.3 C) (08/22 0826) Pulse Rate:  [81] 81  (08/22 0826) Resp:  [20] 20 (08/22 0826) BP: (146)/(56) 146/56 mmHg (08/22 0826) SpO2:  [98 %] 98 % (08/22 0826) Weight:  [124.286 kg (274 lb)] 124.286 kg (274 lb) (08/22 0826)  EXAM: Patient is obese white male in no acute distress. Lungs are clear to auscultation , the patient has symmetrical respiratory excursion. Heart has a regular rate and rhythm normal S1 and S2 no murmur.   Abdomen is soft nontender nondistended bowel sounds are present. Extremity examination shows no clubbing cyanosis or edema. Motor examination shows 5 over 5 strength in the lower extremities including the iliopsoas quadriceps dorsiflexor extensor hallicus  longus and plantar flexor bilaterally. Sensation is intact to pinprick in the distal lower extremities. Reflexes are symmetrical bilaterally. No pathologic reflexes are present. Patient has a flexed forward gait and stance.  Data Review:CBC    Component Value Date/Time   WBC 13.0* 04/22/2015 1305   WBC 10.4 03/10/2015 1624   RBC 4.63 04/22/2015 1305   RBC 4.53 03/10/2015 1624   HGB 13.5 04/22/2015 1305   HCT 41.9 04/22/2015 1305   HCT 38.5 03/10/2015 1624   PLT 283 04/22/2015 1305   MCV 90.5 04/22/2015 1305   MCH 29.2 04/22/2015 1305   MCH 28.0 03/10/2015 1624   MCHC 32.2 04/22/2015 1305   MCHC 33.0 03/10/2015 1624   RDW 14.2 04/22/2015 1305   RDW 14.6 03/10/2015 1624   LYMPHSABS 2.2 03/10/2015 1624   LYMPHSABS 2.4 06/01/2011 0530   MONOABS 1.0 06/01/2011 0530   EOSABS 0.0 06/03/2014 1614   EOSABS 0.1 06/01/2011 0530   BASOSABS 0.0 03/10/2015 1624   BASOSABS 0.0 06/01/2011 0530                          BMET    Component Value Date/Time   NA 142 04/22/2015 1305   NA 143 03/10/2015 1624   K 4.6 04/22/2015 1305   CL 110 04/22/2015 1305   CO2 21* 04/22/2015 1305   GLUCOSE 67 04/22/2015 1305   GLUCOSE 115* 03/10/2015 1624   BUN 15 04/22/2015 1305   BUN 18 03/10/2015 1624   CREATININE 1.09 04/22/2015 1305   CALCIUM 9.1 04/22/2015 1305    GFRNONAA >60 04/22/2015 1305   GFRAA >60 04/22/2015 1305     Assessment/Plan: Patient with neurogenic claudication secondary to multilevel multifactorial lumbar stenosis, and is admitted now for a decompressive lumbar laminectomy.  I've discussed with the patient the nature of his condition, the nature the surgical procedure, the typical length of surgery, hospital stay, and overall recuperation. We discussed limitations postoperatively. I discussed risks of surgery including risks of infection, bleeding, possibly need for transfusion, the risk of nerve root dysfunction with pain, weakness, numbness, or paresthesias, or risk of dural tear and CSF leakage and possible need for further surgery, the risk of instability the spine and possible need for further surgery, and the risk of anesthetic complications including myocardial infarction, stroke, pneumonia, and death. Understanding all this the patient does wish to proceed with surgery and is admitted for such.    Hosie Spangle, MD 05/02/2015 10:21 AM

## 2015-05-02 NOTE — Anesthesia Preprocedure Evaluation (Addendum)
Anesthesia Evaluation  Patient identified by MRN, date of birth, ID band Patient awake    Reviewed: Allergy & Precautions, H&P , NPO status , Patient's Chart, lab work & pertinent test results  History of Anesthesia Complications Negative for: history of anesthetic complications  Airway Mallampati: II  TM Distance: >3 FB Neck ROM: Full    Dental  (+) Poor Dentition, Dental Advisory Given   Pulmonary Current Smoker,  breath sounds clear to auscultation  Pulmonary exam normal       Cardiovascular hypertension, Pt. 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), anemia ,   Anesthesia Other Findings   Reproductive/Obstetrics                            Anesthesia Physical  Anesthesia Plan  ASA: III  Anesthesia Plan: General   Post-op Pain Management:    Induction: Intravenous  Airway Management Planned: Oral ETT  Additional Equipment:   Intra-op Plan:   Post-operative Plan: Extubation in OR  Informed Consent: I have reviewed the patients History and Physical, chart, labs and discussed the procedure including the risks, benefits and alternatives for the proposed anesthesia with the patient or authorized representative who has indicated his/her understanding and acceptance.     Plan Discussed with: CRNA and Surgeon  Anesthesia Plan Comments: (Myasthenia gravis, stable, on mestinon, cellcept and prednisone within 12 months, will ensure stress dose steroids Will avoid NMBDs given MG diagnosis)        Anesthesia Quick Evaluation

## 2015-05-02 NOTE — Transfer of Care (Signed)
Immediate Anesthesia Transfer of Care Note  Patient: Joshua [name]  Procedure(s) Performed: Procedure(s) with comments: LUMBAR LAMINECTOMY/DECOMPRESSION MICRODISCECTOMY 2 LEVELS (N/A) - L3 L4 L5 laminectomies  Patient Location: PACU  Anesthesia Type:General  Level of Consciousness: awake and alert   Airway & Oxygen Therapy: Patient Spontanous Breathing and Patient connected to nasal cannula oxygen  Post-op Assessment: Report given to RN, Post -op Vital signs reviewed and stable and Patient moving all extremities  Post vital signs: Reviewed and stable  Last Vitals:  Filed Vitals:   05/02/15 0826  BP: 146/56  Pulse: 81  Temp: 36.3 C  Resp: 20    Complications: No apparent anesthesia complications

## 2015-05-02 NOTE — Anesthesia Postprocedure Evaluation (Signed)
  Anesthesia Post-op Note  Patient: Joshua [name]  Procedure(s) Performed: Procedure(s) (LRB): LUMBAR LAMINECTOMY/DECOMPRESSION MICRODISCECTOMY 2 LEVELS (N/A)  Patient Location: PACU  Anesthesia Type: General  Level of Consciousness: awake and alert   Airway and Oxygen Therapy: Patient Spontanous Breathing  Post-op Pain: mild  Post-op Assessment: Post-op Vital signs reviewed, Patient's Cardiovascular Status Stable, Respiratory Function Stable, Patent Airway and No signs of Nausea or vomiting  Last Vitals:  Filed Vitals:   05/02/15 1430  BP: 104/38  Pulse: 99  Temp:   Resp: 15    Post-op Vital Signs: stable   Complications: No apparent anesthesia complications

## 2015-05-02 NOTE — Progress Notes (Signed)
Filed Vitals:   05/02/15 1500 05/02/15 1515 05/02/15 1519 05/02/15 1536  BP: 109/60 110/59  113/52  Pulse: 104 104 104 112  Temp:   97.5 F (36.4 C) 97.6 F (36.4 C)  TempSrc:      Resp: 15 11 13 16   Weight:      SpO2: 97% 96% 97% 100%    Patient sitting up, eating dinner. Has ambulated in the hallway. Wound clean and dry. Has voided. Moving all 4 extremities well.  Plan: Encouraged to ambulate at least once if not twice more this evening in the hallways. We'll continue to progress through postoperative recovery.  Hosie Spangle, MD 05/02/2015, 6:38 PM

## 2015-05-03 ENCOUNTER — Encounter (HOSPITAL_COMMUNITY): Payer: Self-pay | Admitting: Neurosurgery

## 2015-05-03 DIAGNOSIS — M4806 Spinal stenosis, lumbar region: Secondary | ICD-10-CM | POA: Diagnosis not present

## 2015-05-03 LAB — GLUCOSE, CAPILLARY: Glucose-Capillary: 121 mg/dL — ABNORMAL HIGH (ref 65–99)

## 2015-05-03 MED ORDER — CYCLOBENZAPRINE HCL 10 MG PO TABS: 5.0000 mg | ORAL_TABLET | Freq: Three times a day (TID) | ORAL | Status: DC | PRN

## 2015-05-03 MED ORDER — HYDROCODONE-ACETAMINOPHEN 5-325 MG PO TABS: 1.0000 | ORAL_TABLET | ORAL | Status: DC | PRN

## 2015-05-03 NOTE — Discharge Summary (Signed)
Physician Discharge Summary  Patient ID: Joshua [name] MRN: [id number] DOB/AGE: [birth year] 69 y.o.  Admit date: 05/02/2015 Discharge date: 05/03/2015  Admission Diagnoses:  lumbar stenosis with neurgenic claudication, lumbar spondylosis, lumbar degenerative disc disease  Discharge Diagnoses:  lumbar stenosis with neurgenic claudication, lumbar spondylosis, lumbar degenerative disc disease Active Problems:   Lumbar stenosis with neurogenic claudication   Discharged Condition: good  Hospital Course: Patient was admitted, underwent an L3, L4, and L5 decompressive lumbar laminectomy. Postoperatively he has done well. He is up and ambulating actively. He is voiding. His dressing was removed and his wound is healing nicely; there is no erythema, swelling, or drainage. He is being discharged home with instructions regarding wound care and activities following discharge. He is scheduled to follow-up with me in the office in 3 weeks.  Discharge Exam: Blood pressure 120/54, pulse 77, temperature 98.6 F (37 C), temperature source Oral, resp. rate 16, weight 124.286 kg (274 lb), SpO2 100 %.  Disposition: 01-Home or Self Care     Medication List    TAKE these medications        atorvastatin 40 MG tablet  Commonly known as:  LIPITOR  Take 40 mg by mouth daily.     B-D ULTRAFINE III SHORT PEN 31G X 8 MM Misc  Generic drug:  Insulin Pen Needle  200 each by Other route 3 (three) times daily between meals.     busPIRone 7.5 MG tablet  Commonly known as:  BUSPAR  Take 1 tablet (7.5 mg total) by mouth 2 (two) times daily.     cetirizine 10 MG tablet  Commonly known as:  ZYRTEC  Take 10 mg by mouth daily.     cyclobenzaprine 10 MG tablet  Commonly known as:  FLEXERIL  Take 0.5-1 tablets (5-10 mg total) by mouth 3 (three) times daily as needed for muscle spasms.     DEXILANT 60 MG capsule  Generic drug:  dexlansoprazole  Take 60 mg by mouth daily.     HYDROcodone-acetaminophen 5-325 MG  per tablet  Commonly known as:  NORCO/VICODIN  Take 1-2 tablets by mouth every 4 (four) hours as needed (mild pain).     insulin aspart protamine- aspart (70-30) 100 UNIT/ML injection  Commonly known as:  NOVOLOG MIX 70/30  Inject 18-48 Units into the skin 3 (three) times daily. 48 units with breakfast and before bed. 18 units with lunch     INVOKANA 300 MG Tabs tablet  Generic drug:  canagliflozin  Take 1 tablet by mouth daily.     lisinopril 5 MG tablet  Commonly known as:  PRINIVIL,ZESTRIL  Take 5 mg by mouth daily.     metFORMIN 1000 MG tablet  Commonly known as:  GLUCOPHAGE  Take 1,000 mg by mouth at bedtime.     mycophenolate 500 MG tablet  Commonly known as:  CELLCEPT  TAKE 2 TABLETS BY MOUTH TWICE A DAY     OCUVITE PO  Take 1 tablet by mouth daily.     ONE TOUCH ULTRA TEST test strip  Generic drug:  glucose blood  200 each by Other route 3 (three) times daily.     ONETOUCH DELICA LANCETS 09N Misc  100 each by Other route 3 (three) times daily.     predniSONE 1 MG tablet  Commonly known as:  DELTASONE  BEGIN 4 TABLETS DAILY, TAPER BY ONE TABLET EVERY 2 MONTHS UNTIL OFF OF THE MEDICATION     pyridostigmine 60 MG tablet  Commonly known  as:  MESTINON  TAKE 0.5 TABLETS (30 MG TOTAL) BY MOUTH 3 (THREE) TIMES DAILY.     ROBITUSSIN DM PO  Take 1 tablet by mouth 2 (two) times daily as needed (cold and sinus).     traMADol 50 MG tablet  Commonly known as:  ULTRAM  Take 100 mg by mouth 3 (three) times daily.     VITAMIN B-12 IJ  Inject 1,000 mcg as directed every 30 (thirty) days.         SignedHosie Spangle 05/03/2015, 8:28 AM

## 2015-05-03 NOTE — Discharge Instructions (Signed)
Wound Care Leave incision open to air. You may shower. Do not scrub directly on incision.  Do not put any creams, lotions, or ointments on incision. Activity Walk each and every day, increasing distance each day. No lifting greater than 5 lbs.  Avoid bending, arching, and twisting. No driving for 2 weeks; may ride as a passenger locally. If provided with back brace, wear when out of bed.  It is not necessary to wear in bed. Diet Resume your normal diet.  Return to Work Will be discussed at you follow up appointment. Call Your Doctor If Any of These Occur Redness, drainage, or swelling at the wound.  Temperature greater than 101 degrees. Severe pain not relieved by pain medication. Incision starts to come apart. Follow Up Appt Call today for appointment in 3 weeks ([phone]) or for problems.  If you have any hardware placed in your spine, you will need an x-ray before your appointment.  Laminectomy - Laminotomy - Discectomy Your surgeon has decided that a laminectomy (entire lamina removal) or laminotomy (partial lamina removal) is the best treatment for your back problem. These procedures involve removal of bone to relieve pressure on nerve roots. It allows the surgeon access to parts of the spine where other problems are located. This could be an injured disc (the cartilage-like structures located between the bones of the back). In this surgery your surgeon removes a part of the boney arch that surrounds your spinal canal. This may be compressing nerve roots. In some cases, the surgeon will remove the disc and fuse (stick together) vertebral bodies (the bones of your back) to make the spine more stable. The type of procedure you will need is usually decided prior to surgery, however modifications may be necessary. The time in surgery depends on the findings in surgery and the procedure necessary to correct the problems. DISCECTOMY For people with disc problems, the surgeon removes the  portion of the disc that is causing the pressure on the nerve root. Some surgeons perform a micro (small) discectomy, which may require removal of only a small portion of the lamina. A disc nucleus (center) may also be removed either through a needle (percutaneous discectomy) or by injecting an enzyme called chymopapain into the disc. Chymopapain is an enzyme that dissolves the disc. For people with back instability, the surgeon fuses vertebrae that are next to each other with tiny pieces of bone. These are used as bone grafts on the facets, or between the vertebrae. When this heals, the bones will no longer be able to move. These bone chips are often taken from the pelvic bones. Bones and bone grafts grow into one unit, stabilizing the segments of the spinal column. LET YOUR CAREGIVER KNOW ABOUT:  Allergies.  Medicines taken including herbs, eyedrops, over-the-counter medicines, and creams.  Use of steroids (by mouth or creams).  Previous problems with anesthetics or numbing medicine.  Possibility of pregnancy, if this applies.  History of blood clots (thrombophlebitis).  History of bleeding or blood problems.  Previous surgery.  Other health problems. RISKS AND COMPLICATIONS Your caregiver will discuss possible risks and complications with you before surgery. In addition to the usual risks of anesthesia, other common risks and complications include:  Blood loss and replacement.  Temporary increase in pain due to surgery.  Uncorrected back pain.  Infection.  New nerve damage (tingling, numbness, and pain). BEFORE THE PROCEDURE  Stop smoking at least 1 week prior to surgery. This lowers risk during surgery.  Your caregiver  may advise that you stop taking certain medicines that may affect the outcome of the surgery and your ability to heal. For example, you may need to stop taking anti-inflammatories, such as aspirin, because of possible bleeding problems. Other medicines may have  interactions with anesthesia.  Tell your caregiver if you have been on steroids for long periods of time. Often, additional steroids are administered intravenously before and during the procedure to prevent complications.  You should be present 60 minutes prior to your procedure or as directed. AFTER THE PROCEDURE After surgery, you will be taken to the recovery area where a nurse will watch and check your progress. Generally, you will be allowed to go home within 1 week barring other problems. HOME CARE INSTRUCTIONS   Check the surgical cut (incision) twice a day for signs of infection. Some signs may include a bad smelling, greenish or yellowish discharge from the wound; increased pain or increased redness over the incision site; an opening of the incision; flu-like symptoms; or a temperature above 101.5 F (38.6 C).  Change your bandages in about 24 to 36 hours following surgery or as directed.  You may shower once the bandage is removed or as directed. Avoid bathtubs, swimming pools, and hot tubs for 3 weeks or until your incision has healed completely. If you have stitches (sutures) or staples they may be removed 2 to 3 weeks after surgery, or as directed by your caregiver.  Follow your caregiver's instructions for activities, exercises, and physical therapy.  Weight reduction may be beneficial if you are overweight.  Walking is permitted. You may use a treadmill without an incline. Cut down on activities if you have discomfort. You may also go up and down stairs as tolerated.  Do not lift anything heavier than 10 to 15 pounds. Avoid bending or twisting at the waist. Always bend your knees.  Maintain strength and range of motion as instructed.  No driving is permitted for 2 to 3 weeks, or as directed by your caregiver. You may be a passenger for 20 to 30 minute trips. Lying back in the passenger seat may be more comfortable for you.  Limit your sitting to 20 to 30 minute intervals.  You should lie down or walk in between sitting periods. There are no limitations for sitting in a recliner chair.  Only take over-the-counter or prescription medicines for pain, discomfort, or fever as directed by your caregiver. SEEK MEDICAL CARE IF:   There is increased bleeding (more than a small spot) from the wound.  You notice redness, swelling, or increasing pain in the wound.  Pus is coming from the wound.  An unexplained oral temperature above 102 F (38.9 C) develops.  You notice a bad smell coming from the wound or dressing. SEEK IMMEDIATE MEDICAL CARE IF:   You develop a rash.  You have difficulty breathing.  You have any allergic problems. Document Released: 08/24/2000 Document Revised: 01/11/2014 Document Reviewed: 06/22/2008 [facility] Patient Information 2015 [place], [place]. This information is not intended to replace advice given to you by your health care provider. Make sure you discuss any questions you have with your health care provider.

## 2015-05-03 NOTE — Progress Notes (Signed)
Pt given D/C instructions with Rx's, verbal understanding was provided. Pt's incision is open to air and has no sign of infection. Pt's IV was removed prior to D/C. Pt has corsett brace on at D/C. Pt D/C'd home via wheelchair @ 1045 per MD order. Pt is stable @ D/C and has no other needs at this time. Holli Humbles, RN

## 2015-05-30 ENCOUNTER — Ambulatory Visit (INDEPENDENT_AMBULATORY_CARE_PROVIDER_SITE_OTHER): Payer: 59

## 2015-05-30 DIAGNOSIS — E538 Deficiency of other specified B group vitamins: Secondary | ICD-10-CM | POA: Diagnosis not present

## 2015-05-31 ENCOUNTER — Telehealth: Payer: Self-pay | Admitting: *Deleted

## 2015-05-31 NOTE — Telephone Encounter (Signed)
I called pt back and he stated there were some things he wanted to speak to the MD about.  Would like to see MD.  I stated that was ok.

## 2015-05-31 NOTE — Telephone Encounter (Signed)
I called and LMVM for pt on mobile # that wanted to offer earlier appt with Dr. Tobey Grim NP (carolyn Collins).

## 2015-06-07 ENCOUNTER — Encounter: Payer: Self-pay | Admitting: Neurology

## 2015-06-07 ENCOUNTER — Ambulatory Visit (INDEPENDENT_AMBULATORY_CARE_PROVIDER_SITE_OTHER): Payer: 59 | Admitting: Neurology

## 2015-06-07 VITALS — BP 122/70 | HR 79 | Ht 68.0 in | Wt 270.5 lb

## 2015-06-07 DIAGNOSIS — G7000 Myasthenia gravis without (acute) exacerbation: Secondary | ICD-10-CM

## 2015-06-07 DIAGNOSIS — Z5181 Encounter for therapeutic drug level monitoring: Secondary | ICD-10-CM

## 2015-06-07 MED ORDER — PREDNISONE 1 MG PO TABS: ORAL_TABLET | ORAL | Status: DC

## 2015-06-07 MED ORDER — MYCOPHENOLATE MOFETIL 500 MG PO TABS: 1000.0000 mg | ORAL_TABLET | Freq: Two times a day (BID) | ORAL | Status: DC

## 2015-06-07 MED ORDER — PYRIDOSTIGMINE BROMIDE 60 MG PO TABS: ORAL_TABLET | ORAL | Status: DC

## 2015-06-07 NOTE — Progress Notes (Signed)
Reason for visit: Myasthenia gravis  Joshua [name] is an 69 y.o. male  History of present illness:  Joshua [name] is a 68 year old right-handed white male with a history of obesity, diabetes, and low back pain. The patient recently underwent lumbosacral spine surgery on 05/02/2015. The patient has myasthenia gravis, but he has done well with this. He remains on CellCept and he is slowly tapering off of his prednisone. He currently is on 2 mg daily of prednisone. He does report some fatigue issues, no double vision or difficulty chewing or swallowing. He is trying to get back into exercising on a regular basis. He has not required physical therapy following his lumbar spine surgery. He returns this office for an evaluation.  Past Medical History  Diagnosis Date  . Colon cancer   . Diabetes   . Hypertension   . Hyperlipidemia   . Morbid obesity   . Anemia   . Diverticulosis   . Hemorrhoids   . Colon polyps   . DJD (degenerative joint disease)   . Anxiety   . Arthritis   . Basal cell carcinoma   . Vitamin B12 deficiency   . Chronic low back pain 12/07/2014  . GERD (gastroesophageal reflux disease)   . Myasthenia gravis   . Myasthenia gravis without exacerbation 05/19/2013  . Carpal tunnel syndrome, bilateral     Past Surgical History  Procedure Laterality Date  . Right colectomy  2006  . Patella fracture surgery Left 2011  . Ankle arthroplasty Right 1969  . Tonsillectomy  1968  . Basal cell carcinoma excision      face  . Carpal tunnel with cubital tunnel Left 06/02/2013    Procedure: CARPAL TUNNEL WITH CUBITAL TUNNEL;  Surgeon: Tennis Must, MD;  Location: [facility];  Service: Orthopedics;  Laterality: Left;  . Carpal tunnel with cubital tunnel Right 07/14/2013    Procedure: RIGHT CARPAL TUNNEL AND CUBITAL TUNNEL RELEASE;  Surgeon: Tennis Must, MD;  Location: [facility];  Service: Orthopedics;  Laterality: Right;  . Lumbar  laminectomy/decompression microdiscectomy N/A 05/02/2015    Procedure: LUMBAR LAMINECTOMY/DECOMPRESSION MICRODISCECTOMY 2 LEVELS;  Surgeon: Jovita Gamma, MD;  Location: [facility];  Service: Neurosurgery;  Laterality: N/A;  L3 L4 L5 laminectomies    Family History  Problem Relation Age of Onset  . Prostate cancer Maternal Grandfather   . Colon polyps Maternal Grandfather   . Heart disease Mother   . Colon cancer Neg Hx     Social history:  reports that he has been smoking Cigars.  He has never used smokeless tobacco. He reports that he drinks alcohol. He reports that he does not use illicit drugs.    Allergies  Allergen Reactions  . Aluminum Chlorohydrate Rash  . Niacin And Related Rash    Medications:  Prior to Admission medications   Medication Sig Start Date End Date Taking? Authorizing Provider  atorvastatin (LIPITOR) 40 MG tablet Take 40 mg by mouth daily.   Yes Historical Provider, MD  B-D ULTRAFINE III SHORT PEN 31G X 8 MM MISC 200 each by Other route 3 (three) times daily between meals. 04/09/13  Yes Historical Provider, MD  busPIRone (BUSPAR) 7.5 MG tablet Take 1 tablet (7.5 mg total) by mouth 2 (two) times daily. 06/03/14  Yes Kathrynn Ducking, MD  cetirizine (ZYRTEC) 10 MG tablet Take 10 mg by mouth daily.   Yes Historical Provider, MD  Cyanocobalamin (VITAMIN B-12 IJ) Inject 1,000 mcg as  directed every 30 (thirty) days.   Yes Historical Provider, MD  cyclobenzaprine (FLEXERIL) 10 MG tablet Take 0.5-1 tablets (5-10 mg total) by mouth 3 (three) times daily as needed for muscle spasms. 05/03/15  Yes Jovita Gamma, MD  DEXILANT 60 MG capsule Take 60 mg by mouth daily.  12/09/12  Yes Historical Provider, MD  Dextromethorphan-Guaifenesin (ROBITUSSIN DM PO) Take 1 tablet by mouth 2 (two) times daily as needed (cold and sinus).   Yes Historical Provider, MD  HYDROcodone-acetaminophen (NORCO/VICODIN) 5-325 MG per tablet Take 1-2 tablets by mouth every 4 (four) hours as needed  (mild pain). 05/03/15  Yes Jovita Gamma, MD  insulin aspart protamine-insulin aspart (NOVOLOG 70/30) (70-30) 100 UNIT/ML injection Inject 18-48 Units into the skin 3 (three) times daily. 48 units with breakfast and before bed. 18 units with lunch   Yes Historical Provider, MD  INVOKANA 300 MG TABS Take 1 tablet by mouth daily.  11/26/12  Yes Historical Provider, MD  lisinopril (PRINIVIL,ZESTRIL) 5 MG tablet Take 5 mg by mouth daily. 11/27/13  Yes Historical Provider, MD  metFORMIN (GLUCOPHAGE) 1000 MG tablet Take 1,000 mg by mouth at bedtime.  09/25/12  Yes Historical Provider, MD  Multiple Vitamins-Minerals (OCUVITE PO) Take 1 tablet by mouth daily.   Yes Historical Provider, MD  mycophenolate (CELLCEPT) 500 MG tablet Take 2 tablets (1,000 mg total) by mouth 2 (two) times daily. 06/07/15  Yes Kathrynn Ducking, MD  ONE TOUCH ULTRA TEST test strip 200 each by Other route 3 (three) times daily. 04/09/13  Yes Historical Provider, MD  Jonetta Speak LANCETS 56L MISC 100 each by Other route 3 (three) times daily. 04/14/13  Yes Historical Provider, MD  predniSONE (DELTASONE) 1 MG tablet BEGIN 4 TABLETS DAILY, TAPER BY ONE TABLET EVERY 2 MONTHS UNTIL OFF OF THE MEDICATION Patient taking differently: BEGIN 3 TABLETS DAILY, TAPER BY ONE TABLET EVERY 2 MONTHS UNTIL OFF OF THE MEDICATION 04/01/15  Yes Kathrynn Ducking, MD  pyridostigmine (MESTINON) 60 MG tablet TAKE 0.5 TABLETS (30 MG TOTAL) BY MOUTH 3 (THREE) TIMES DAILY. 06/07/15  Yes Kathrynn Ducking, MD  traMADol (ULTRAM) 50 MG tablet Take 100 mg by mouth 3 (three) times daily.  09/09/12  Yes Historical Provider, MD    ROS:  Out of a complete 14 system review of symptoms, the patient complains only of the following symptoms, and all other reviewed systems are negative.  Frequent waking, daytime sleepiness Weakness  Blood pressure 122/70, pulse 79, height 5\' 8"  (1.727 m), weight 270 lb 8 oz (122.698 kg).  Physical Exam  General: The patient is alert and  cooperative at the time of the examination. The patient is markedly obese.  Skin: No significant peripheral edema is noted.   Neurologic Exam  Mental status: The patient is alert and oriented x 3 at the time of the examination. The patient has apparent normal recent and remote memory, with an apparently normal attention span and concentration ability.   Cranial nerves: Facial symmetry is present. Speech is normal, no aphasia or dysarthria is noted. Extraocular movements are full. Visual fields are full.  Motor: The patient has good strength in all 4 extremities.  Sensory examination: Soft touch sensation is symmetric on the face, arms, and legs.  Coordination: The patient has good finger-nose-finger and heel-to-shin bilaterally.  Gait and station: The patient has a limping type gait on the left leg. The tandem gait was unsteady. Romberg is negative. No drift is seen.  Reflexes: Deep tendon reflexes are  symmetric.   Assessment/Plan:  1. Myasthenia gravis  2. Low back pain, recent lumbar spine surgery  The patient is doing well with the prednisone taper so far. We will continue tapering him off of the prednisone. The patient will remain on CellCept, he will have blood work done today, he will follow-up in 6 months.  Jill Alexanders MD 06/07/2015 7:57 PM  [facility] [address] [place], [place] [address]  Phone [phone] Fax [phone]

## 2015-06-07 NOTE — Patient Instructions (Signed)
Myasthenia Gravis Myasthenia gravis is a disease that causes muscle weakness throughout the body. The muscles affected are the ones we can control (voluntary muscles). An example of a voluntary muscle is your hand muscles. You can control the muscles to make the hand pick something up. An example of an involuntary muscle is the heart. The heart beats without any direction from you.  Myasthenia Gravis is thought to be an autoimmune disease. That means that normal defenses of the body begin to attack the body. In this case, the immune system begins to attack cells located at the junctions of the muscles and the nerves. Women are affected more often. Women are affected at a younger age than men. Babies born to affected women frequently develop symptoms at an early age. SYMPTOMS Initially in the disease, the facial muscles are affected first. After this, a person may develop droopy eyelids. They may have difficulty controlling facial muscles. They may have problems chewing. Swallowing and speaking may become impaired. The weakness gradually spreads to the arms and legs. It begins to affect breathing. Sometimes, the symptoms lessen or go away without any apparent cause. DIAGNOSIS  Diagnosis can be made with blood tests. Tests such as electromyography may be done to examine the electrical activity in the muscle. An improvement in symptoms after having an anti-cholinesterase drug helps confirm the diagnosis.  TREATMENT  Medicines are usually prescribed as the first treatment. These medicines help, but they do not cure the disease. A plasma cleansing procedure (plasmapheresis) can be used to treat a crisis. It can also be used to prepare a person for surgery. This procedure produces short-term improvement. Some cases are helped by removing the thymus gland. Steroids are used for short-term benefits. Document Released: 12/03/2000 Document Revised: 11/19/2011 Document Reviewed: 10/28/2013 [employer] Patient  Information 2015 [employer]. This information is not intended to replace advice given to you by your health care provider. Make sure you discuss any questions you have with your health care provider.  

## 2015-06-08 ENCOUNTER — Telehealth: Payer: Self-pay

## 2015-06-08 LAB — COMPREHENSIVE METABOLIC PANEL
ALBUMIN: 3.8 g/dL (ref 3.6–4.8)
ALT: 11 IU/L (ref 0–44)
AST: 11 IU/L (ref 0–40)
Albumin/Globulin Ratio: 1.7 (ref 1.1–2.5)
Alkaline Phosphatase: 101 IU/L (ref 39–117)
BUN / CREAT RATIO: 13 (ref 10–22)
BUN: 15 mg/dL (ref 8–27)
Bilirubin Total: 0.3 mg/dL (ref 0.0–1.2)
CALCIUM: 8.9 mg/dL (ref 8.6–10.2)
CO2: 24 mmol/L (ref 18–29)
CREATININE: 1.14 mg/dL (ref 0.76–1.27)
Chloride: 104 mmol/L (ref 97–108)
GFR calc Af Amer: 76 mL/min/{1.73_m2} (ref >59)
GFR, EST NON AFRICAN AMERICAN: 66 mL/min/{1.73_m2} (ref >59)
GLOBULIN, TOTAL: 2.2 g/dL (ref 1.5–4.5)
Glucose: 168 mg/dL — ABNORMAL HIGH (ref 65–99)
Potassium: 4.2 mmol/L (ref 3.5–5.2)
SODIUM: 143 mmol/L (ref 134–144)
Total Protein: 6 g/dL (ref 6.0–8.5)

## 2015-06-08 LAB — CBC WITH DIFFERENTIAL/PLATELET
BASOS: 0 %
Basophils Absolute: 0 10*3/uL (ref 0.0–0.2)
EOS (ABSOLUTE): 0.1 10*3/uL (ref 0.0–0.4)
EOS: 1 %
HEMATOCRIT: 39 % (ref 37.5–51.0)
Hemoglobin: 12.7 g/dL (ref 12.6–17.7)
IMMATURE GRANULOCYTES: 0 %
Immature Grans (Abs): 0 10*3/uL (ref 0.0–0.1)
LYMPHS ABS: 1.9 10*3/uL (ref 0.7–3.1)
Lymphs: 23 %
MCH: 28.2 pg (ref 26.6–33.0)
MCHC: 32.6 g/dL (ref 31.5–35.7)
MCV: 87 fL (ref 79–97)
MONOS ABS: 0.7 10*3/uL (ref 0.1–0.9)
Monocytes: 8 %
NEUTROS PCT: 68 %
Neutrophils Absolute: 5.6 10*3/uL (ref 1.4–7.0)
Platelets: 272 10*3/uL (ref 150–379)
RBC: 4.51 x10E6/uL (ref 4.14–5.80)
RDW: 14.6 % (ref 12.3–15.4)
WBC: 8.3 10*3/uL (ref 3.4–10.8)

## 2015-06-08 NOTE — Telephone Encounter (Signed)
I called the patient and relayed results. 

## 2015-06-08 NOTE — Telephone Encounter (Signed)
-----   Message from Kathrynn Ducking, MD sent at 06/08/2015  7:39 AM EDT -----  The blood work results are unremarkable. Please call the patient.  ----- Message -----    From: [facility] Lab Results In Interface    Sent: 06/08/2015   5:42 AM      To: Kathrynn Ducking, MD

## 2015-06-18 ENCOUNTER — Other Ambulatory Visit: Payer: Self-pay | Admitting: Neurology

## 2015-06-18 NOTE — Telephone Encounter (Signed)
Originally prescribed at [facility] on 09/24

## 2015-06-29 ENCOUNTER — Ambulatory Visit: Payer: 59

## 2015-07-12 DIAGNOSIS — H35033 Hypertensive retinopathy, bilateral: Secondary | ICD-10-CM | POA: Insufficient documentation

## 2015-07-12 DIAGNOSIS — E113393 Type 2 diabetes mellitus with moderate nonproliferative diabetic retinopathy without macular edema, bilateral: Secondary | ICD-10-CM | POA: Insufficient documentation

## 2015-07-12 DIAGNOSIS — H3581 Retinal edema: Secondary | ICD-10-CM | POA: Insufficient documentation

## 2015-07-12 DIAGNOSIS — H35373 Puckering of macula, bilateral: Secondary | ICD-10-CM | POA: Insufficient documentation

## 2015-07-12 DIAGNOSIS — H2513 Age-related nuclear cataract, bilateral: Secondary | ICD-10-CM | POA: Insufficient documentation

## 2015-07-12 DIAGNOSIS — E11311 Type 2 diabetes mellitus with unspecified diabetic retinopathy with macular edema: Secondary | ICD-10-CM | POA: Insufficient documentation

## 2015-09-02 ENCOUNTER — Telehealth: Payer: Self-pay | Admitting: Neurology

## 2015-09-02 DIAGNOSIS — Z5181 Encounter for therapeutic drug level monitoring: Secondary | ICD-10-CM

## 2015-09-02 NOTE — Telephone Encounter (Signed)
I called patient. He is to come in at his convenience next week to get blood work done, he is on CellCept.

## 2015-09-08 ENCOUNTER — Ambulatory Visit (INDEPENDENT_AMBULATORY_CARE_PROVIDER_SITE_OTHER): Payer: 59 | Admitting: *Deleted

## 2015-09-08 DIAGNOSIS — E538 Deficiency of other specified B group vitamins: Secondary | ICD-10-CM | POA: Diagnosis not present

## 2015-09-08 MED ORDER — CYANOCOBALAMIN 1000 MCG/ML IJ SOLN
1000.0000 ug | INTRAMUSCULAR | Status: AC
  Administered 2015-09-08 – 2016-02-07 (×6): 1000 ug via INTRAMUSCULAR

## 2015-09-08 NOTE — Patient Instructions (Signed)
See next month. 

## 2015-09-08 NOTE — Progress Notes (Signed)
Pt here for B12 injection .  Under aseptic technique cyanocobalamin 1000mcg/1ml IM to L deltoid.    Tolerated well.  Bandaid applied.   

## 2015-09-27 ENCOUNTER — Other Ambulatory Visit (INDEPENDENT_AMBULATORY_CARE_PROVIDER_SITE_OTHER): Payer: Self-pay

## 2015-09-27 DIAGNOSIS — Z0289 Encounter for other administrative examinations: Secondary | ICD-10-CM

## 2015-09-27 DIAGNOSIS — Z5181 Encounter for therapeutic drug level monitoring: Secondary | ICD-10-CM

## 2015-09-28 ENCOUNTER — Telehealth: Payer: Self-pay | Admitting: Neurology

## 2015-09-28 LAB — CBC WITH DIFFERENTIAL/PLATELET
BASOS ABS: 0 10*3/uL (ref 0.0–0.2)
Basos: 0 %
EOS (ABSOLUTE): 0.1 10*3/uL (ref 0.0–0.4)
Eos: 1 %
Hematocrit: 38.7 % (ref 37.5–51.0)
Hemoglobin: 13 g/dL (ref 12.6–17.7)
Immature Grans (Abs): 0 10*3/uL (ref 0.0–0.1)
Immature Granulocytes: 0 %
LYMPHS ABS: 2.2 10*3/uL (ref 0.7–3.1)
Lymphs: 27 %
MCH: 28.6 pg (ref 26.6–33.0)
MCHC: 33.6 g/dL (ref 31.5–35.7)
MCV: 85 fL (ref 79–97)
MONOS ABS: 0.8 10*3/uL (ref 0.1–0.9)
Monocytes: 10 %
Neutrophils Absolute: 5 10*3/uL (ref 1.4–7.0)
Neutrophils: 62 %
Platelets: 354 10*3/uL (ref 150–379)
RBC: 4.54 x10E6/uL (ref 4.14–5.80)
RDW: 14 % (ref 12.3–15.4)
WBC: 8.1 10*3/uL (ref 3.4–10.8)

## 2015-09-28 LAB — COMPREHENSIVE METABOLIC PANEL
ALBUMIN: 3.7 g/dL (ref 3.6–4.8)
ALK PHOS: 111 IU/L (ref 39–117)
ALT: 7 IU/L (ref 0–44)
AST: 10 IU/L (ref 0–40)
Albumin/Globulin Ratio: 1.8 (ref 1.1–2.5)
BILIRUBIN TOTAL: 0.4 mg/dL (ref 0.0–1.2)
BUN / CREAT RATIO: 15 (ref 10–22)
BUN: 15 mg/dL (ref 8–27)
CHLORIDE: 107 mmol/L — AB (ref 96–106)
CO2: 25 mmol/L (ref 18–29)
Calcium: 9.1 mg/dL (ref 8.6–10.2)
Creatinine, Ser: 1.03 mg/dL (ref 0.76–1.27)
GFR calc Af Amer: 86 mL/min/{1.73_m2} (ref >59)
GFR calc non Af Amer: 74 mL/min/{1.73_m2} (ref >59)
GLOBULIN, TOTAL: 2.1 g/dL (ref 1.5–4.5)
GLUCOSE: 74 mg/dL (ref 65–99)
Potassium: 4.6 mmol/L (ref 3.5–5.2)
SODIUM: 145 mmol/L — AB (ref 134–144)
Total Protein: 5.8 g/dL — ABNORMAL LOW (ref 6.0–8.5)

## 2015-09-28 NOTE — Telephone Encounter (Signed)
I called patient. Blood work is unremarkable with exception that the sodium and chloride levels are minimally elevated, total protein level is minimally low. I have recommended that he increase his fluid intake a small amount during the day to see if the sodium level would drop. The patient has a normal CBC.

## 2015-10-10 ENCOUNTER — Ambulatory Visit (INDEPENDENT_AMBULATORY_CARE_PROVIDER_SITE_OTHER): Payer: Medicare Other | Admitting: *Deleted

## 2015-10-10 ENCOUNTER — Encounter (INDEPENDENT_AMBULATORY_CARE_PROVIDER_SITE_OTHER): Payer: Self-pay

## 2015-10-10 DIAGNOSIS — E538 Deficiency of other specified B group vitamins: Secondary | ICD-10-CM

## 2015-10-10 NOTE — Progress Notes (Signed)
Pt here for B 12 injection.  Under aseptic technique cyanocobalamin 1000mcg/1ml IM given R deltoid.  Tolerated well.  Bandaid applied.  

## 2015-10-10 NOTE — Patient Instructions (Signed)
See next month. 

## 2015-10-17 ENCOUNTER — Telehealth: Payer: Self-pay | Admitting: Neurology

## 2015-10-17 MED ORDER — MYCOPHENOLATE MOFETIL 500 MG PO TABS: 1000.0000 mg | ORAL_TABLET | Freq: Two times a day (BID) | ORAL | Status: DC

## 2015-10-17 NOTE — Telephone Encounter (Signed)
I called back to inquire what medication a refill is needed on.  He would like Cellcept for 90 days.  Rx has been sent.

## 2015-10-17 NOTE — Telephone Encounter (Signed)
Pt needs a new rx sent to [facility] on [address]. He has changed pharmacies . Thank you

## 2015-11-08 ENCOUNTER — Ambulatory Visit (INDEPENDENT_AMBULATORY_CARE_PROVIDER_SITE_OTHER): Payer: Medicare Other | Admitting: *Deleted

## 2015-11-08 DIAGNOSIS — E538 Deficiency of other specified B group vitamins: Secondary | ICD-10-CM | POA: Diagnosis not present

## 2015-11-09 ENCOUNTER — Telehealth: Payer: Self-pay | Admitting: Neurology

## 2015-11-09 DIAGNOSIS — E538 Deficiency of other specified B group vitamins: Secondary | ICD-10-CM

## 2015-11-09 NOTE — Telephone Encounter (Signed)
[facility] [phone] called regarding a secondary review they are doing for mycophenolate (CELLCEPT) 500 MG tablet, wants to know if there are any other diagnoses to support mycophenolate (CELLCEPT).

## 2015-11-10 NOTE — Telephone Encounter (Signed)
Spoke to Joshua [name] at [facility] - says medication was approved for 60 days while his case is being reviewed - his diagnosis of Myasthenia Gravis has been provided.  They will continue to fill if case is approved and will contact our office if denied.  Acathioprine and cyclosporine are the preferred medications on his plan.   (The number below is correct - it is Catering manager at [facility])

## 2015-11-10 NOTE — Telephone Encounter (Signed)
The number in phone message, [phone] is to someone's mailbox. Unable to leave message or speak with a person.

## 2015-12-06 ENCOUNTER — Encounter: Payer: Self-pay | Admitting: Neurology

## 2015-12-06 ENCOUNTER — Ambulatory Visit (INDEPENDENT_AMBULATORY_CARE_PROVIDER_SITE_OTHER): Payer: Medicare Other | Admitting: *Deleted

## 2015-12-06 ENCOUNTER — Telehealth: Payer: Self-pay | Admitting: Neurology

## 2015-12-06 ENCOUNTER — Ambulatory Visit (INDEPENDENT_AMBULATORY_CARE_PROVIDER_SITE_OTHER): Payer: Medicare Other | Admitting: Neurology

## 2015-12-06 VITALS — BP 147/70 | HR 80 | Ht 68.0 in | Wt 272.5 lb

## 2015-12-06 DIAGNOSIS — E539 Vitamin B deficiency, unspecified: Secondary | ICD-10-CM | POA: Diagnosis not present

## 2015-12-06 DIAGNOSIS — G7000 Myasthenia gravis without (acute) exacerbation: Secondary | ICD-10-CM

## 2015-12-06 DIAGNOSIS — E538 Deficiency of other specified B group vitamins: Secondary | ICD-10-CM | POA: Diagnosis not present

## 2015-12-06 MED ORDER — PYRIDOSTIGMINE BROMIDE 60 MG PO TABS: ORAL_TABLET | ORAL | Status: DC

## 2015-12-06 NOTE — Patient Instructions (Signed)
See next month. 

## 2015-12-06 NOTE — Telephone Encounter (Signed)
Pt's insurance called and pt has not been approved for mycophenolate (CELLCEPT) 500 MG tablet, however he is being allowed to purchase the medication until May. After that he will need to be on a different medication. A faxed information sheet has been sent as well.

## 2015-12-06 NOTE — Progress Notes (Signed)
Pt here for B 12 injection.  Under aseptic technique cyanocobalamin 1000mcg/1ml IM given L deltoid.  Tolerated well.  Bandaid applied.  

## 2015-12-06 NOTE — Progress Notes (Signed)
Reason for visit: Myasthenia gravis  Joshua [name] is an 70 y.o. male  History of present illness:  Joshua [name] is a 70 year old right-handed white male with a history of myasthenia gravis. Originally, he presented with pharyngeal weakness with difficulty with chewing and swallowing, some ptosis without double vision. The patient had weight loss. He has been placed on CellCept and prednisone and Mestinon with good improvement. He has been able to taper off of the prednisone completely, he uses Mestinon mainly as an as needed medication, and he remains on the CellCept. The patient has been told by his insurance company that they will not longer approve the use of CellCept. The patient has recently been placed on gabapentin for discomfort in the low back, and some wrist pain. The patient denies any further issues with double vision, difficulty with chewing or swallowing. He will be undergoing a sleep evaluation through his primary care physician.  Past Medical History  Diagnosis Date  . Colon cancer ([place])   . Diabetes ([place])   . Hypertension   . Hyperlipidemia   . Morbid obesity ([place])   . Anemia   . Diverticulosis   . Hemorrhoids   . Colon polyps   . DJD (degenerative joint disease)   . Anxiety   . Arthritis   . Basal cell carcinoma   . Vitamin B12 deficiency   . Chronic low back pain 12/07/2014  . GERD (gastroesophageal reflux disease)   . Myasthenia gravis (Joshua [name])   . Myasthenia gravis without exacerbation (Joshua [name]) 05/19/2013  . Carpal tunnel syndrome, bilateral     Past Surgical History  Procedure Laterality Date  . Right colectomy  2006  . Patella fracture surgery Left 2011  . Ankle arthroplasty Right 1969  . Tonsillectomy  1968  . Basal cell carcinoma excision      face  . Carpal tunnel with cubital tunnel Left 06/02/2013    Procedure: CARPAL TUNNEL WITH CUBITAL TUNNEL;  Surgeon: Tennis Must, MD;  Location: [facility];  Service: Orthopedics;  Laterality: Left;    . Carpal tunnel with cubital tunnel Right 07/14/2013    Procedure: RIGHT CARPAL TUNNEL AND CUBITAL TUNNEL RELEASE;  Surgeon: Tennis Must, MD;  Location: [facility];  Service: Orthopedics;  Laterality: Right;  . Lumbar laminectomy/decompression microdiscectomy N/A 05/02/2015    Procedure: LUMBAR LAMINECTOMY/DECOMPRESSION MICRODISCECTOMY 2 LEVELS;  Surgeon: Jovita Gamma, MD;  Location: [facility];  Service: Neurosurgery;  Laterality: N/A;  L3 L4 L5 laminectomies    Family History  Problem Relation Age of Onset  . Prostate cancer Maternal Grandfather   . Colon polyps Maternal Grandfather   . Heart disease Mother   . Colon cancer Neg Hx     Social history:  reports that he has been smoking Cigars.  He has never used smokeless tobacco. He reports that he drinks alcohol. He reports that he does not use illicit drugs.    Allergies  Allergen Reactions  . Aluminum Chlorohydrate Rash  . Niacin And Related Rash    Medications:  Prior to Admission medications   Medication Sig Start Date End Date Taking? Authorizing Provider  atorvastatin (LIPITOR) 40 MG tablet Take 40 mg by mouth daily.   Yes Historical Provider, MD  B-D ULTRAFINE III SHORT PEN 31G X 8 MM MISC 200 each by Other route 3 (three) times daily between meals. 04/09/13  Yes Historical Provider, MD  busPIRone (BUSPAR) 7.5 MG tablet TAKE 1 TABLET (7.5 MG TOTAL) BY  MOUTH 2 (TWO) TIMES DAILY. 06/18/15  Yes Kathrynn Ducking, MD  cetirizine (ZYRTEC) 10 MG tablet Take 10 mg by mouth daily.   Yes Historical Provider, MD  Cyanocobalamin (VITAMIN B-12 IJ) Inject 1,000 mcg as directed every 30 (thirty) days.   Yes Historical Provider, MD  Dextromethorphan-Guaifenesin (ROBITUSSIN DM PO) Take 1 tablet by mouth 2 (two) times daily as needed (cold and sinus).   Yes Historical Provider, MD  famotidine (PEPCID) 20 MG tablet Take 20 mg by mouth 2 (two) times daily.   Yes Historical Provider, MD  gabapentin (NEURONTIN) 300 MG capsule  Take 300 mg by mouth at bedtime.   Yes Historical Provider, MD  HYDROcodone-acetaminophen (NORCO/VICODIN) 5-325 MG per tablet Take 1-2 tablets by mouth every 4 (four) hours as needed (mild pain). 05/03/15  Yes Jovita Gamma, MD  insulin aspart protamine-insulin aspart (NOVOLOG 70/30) (70-30) 100 UNIT/ML injection Inject 18-48 Units into the skin 3 (three) times daily. 48 units with breakfast and before bed. 18 units with lunch   Yes Historical Provider, MD  INVOKANA 300 MG TABS Take 1 tablet by mouth daily.  11/26/12  Yes Historical Provider, MD  lisinopril (PRINIVIL,ZESTRIL) 5 MG tablet Take 5 mg by mouth daily. 11/27/13  Yes Historical Provider, MD  metFORMIN (GLUCOPHAGE) 1000 MG tablet Take 1,000 mg by mouth at bedtime.  09/25/12  Yes Historical Provider, MD  Multiple Vitamins-Minerals (OCUVITE PO) Take 1 tablet by mouth daily.   Yes Historical Provider, MD  mycophenolate (CELLCEPT) 500 MG tablet Take 2 tablets (1,000 mg total) by mouth 2 (two) times daily. 10/17/15  Yes Kathrynn Ducking, MD  ONE TOUCH ULTRA TEST test strip 200 each by Other route 3 (three) times daily. 04/09/13  Yes Historical Provider, MD  Jonetta Speak LANCETS [id number] MISC 100 each by Other route 3 (three) times daily. 04/14/13  Yes Historical Provider, MD  pyridostigmine (MESTINON) 60 MG tablet TAKE 0.5 TABLETS (30 MG TOTAL) BY MOUTH 3 (THREE) TIMES DAILY. Patient taking differently: TAKE 0.5 TABLETS (30 MG TOTAL) BY MOUTH 2-3 TIMES DAILY. 06/07/15  Yes Kathrynn Ducking, MD  traMADol (ULTRAM) 50 MG tablet Take 100 mg by mouth 3 (three) times daily.  09/09/12  Yes Historical Provider, MD    ROS:  Out of a complete 14 system review of symptoms, the patient complains only of the following symptoms, and all other reviewed systems are negative.  Decreased activity Hearing loss, drooling Eye discharge, eye itching, eye redness Insomnia Joint pain, back pain Moles, itching Bruising easily Depression, anxiety, panic attacks  Blood  pressure 147/70, pulse 80, height 5\' 8"  (1.727 m), weight 272 lb 8 oz (123.605 kg).  Physical Exam  General: The patient is alert and cooperative at the time of the examination. The patient is markedly obese.  Skin: No significant peripheral edema is noted.   Neurologic Exam  Mental status: The patient is alert and oriented x 3 at the time of the examination. The patient has apparent normal recent and remote memory, with an apparently normal attention span and concentration ability.   Cranial nerves: Facial symmetry is present. Speech is normal, no aphasia or dysarthria is noted. Extraocular movements are full. Visual fields are full. With superior gaze for 1 minute, no ptosis or divergence of gaze is noted. No subjective double vision is noted.  Motor: The patient has good strength in all 4 extremities. With the arms outstretched for 1 minute, no fatigable weakness of the deltoid muscles is seen.  Sensory examination: Soft  touch sensation is symmetric on the face, arms, and legs.  Coordination: The patient has good finger-nose-finger and heel-to-shin bilaterally.  Gait and station: The patient has a normal gait. Tandem gait is slightly unsteady. Romberg is negative. No drift is seen.  Reflexes: Deep tendon reflexes are symmetric, but are depressed.   Assessment/Plan:  1. Myasthenia gravis  2. Diabetes  The patient is doing fairly well at this time with his myasthenia gravis. I will write a letter regarding the use of CellCept, the patient has been on this medication for a number of years, it has demonstrated good effectiveness. The patient has had recent blood work done on 12/02/2015. The patient has normal liver profile, white count 7.9, hemoglobin of 12.1, platelets of 339. He will follow-up through this office in about 6 months. A prescription was sent in for the Mestinon.  Jill Alexanders MD 12/06/2015 7:25 PM  [facility] [address] [place], [place] [address]  Phone [phone] Fax [phone]

## 2015-12-06 NOTE — Patient Instructions (Signed)
Myasthenia Gravis Myasthenia gravis (MG) means severe weakness. It is a long-term (chronic) condition that causes weakness in the muscles you can control (voluntary muscles). MG can affect any voluntary muscle. The muscles most often affected are the ones that control:   Eye movement.  Facial movements.  Swallowing. MG is an autoimmune disease, which means that your body's defense system (immune system) attacks healthy parts of your body instead of germs and other things that make you sick. When you have MG, your immune system makes proteins (antibodies) that block the chemical (acetylcholine) your body needs to send nerve signals to your muscles. This causes muscle weakness. CAUSES  The exact cause of MG is unknown. One possible cause is an enlarged thymus gland, which is located under your breastbone.  SIGNS AND SYMPTOMS The earliest symptom of MG is muscle weakness that gets worse with activity and gets better after rest. Other symptoms of MG may include:  Drooping eyelids.  Double vision.  Loss of facial expression.  Trouble chewing and swallowing.  Slurred speech.  A waddling walk.  Weakness of the arms, hands, and legs. Trouble breathing is the most dangerous symptom of MG. Sudden and severe difficulty breathing (myasthenic crisis) may require emergency breathing support. This symptom sometimes happens after:   Infection.  Fever.  Drug reaction. DIAGNOSIS  It can be hard to diagnose MG because muscle weakness is a common symptom in many conditions. Your health care provider will do a physical exam. You may also have tests that will help make a diagnosis. These may include:  A blood test.  A test using the medicine edrophonium. This medicine increases muscle strength by slowing the breakdown of acetylcholine.  Tests to measure nerve conduction to muscle (electromyography).  An imaging study of the chest (CT or MRI). TREATMENT  Treatment can improve muscle strength.  Sometimes symptoms of MG go away for a while (remission) and you can stop treatment. Possible treatments include:  Medicine.  Removal of the thymus gland (thymectomy). This may result in a long remission for some people. HOME CARE INSTRUCTIONS  Take medicines only as directed by your health care provider.  Get plenty of rest to conserve your energy.  Take frequent breaks to rest your eyes.  Maintain a healthy diet and a healthy weight.  Do not use any tobacco products including cigarettes, chewing tobacco, or electronic cigarettes. If you need help quitting, ask your health care provider.  Keep all follow-up visits as directed by your health care provider. This is important. SEEK MEDICAL CARE IF:  Your symptoms get worse after a fever or infection.  You have a reaction to a medicine you are taking.  Your symptoms change or get worse. SEEK IMMEDIATE MEDICAL CARE IF: You have trouble breathing.    This information is not intended to replace advice given to you by your health care provider. Make sure you discuss any questions you have with your health care provider.   Document Released: 12/03/2000 Document Revised: 09/17/2014 Document Reviewed: 10/28/2013 Elsevier Interactive Patient Education 2016 Elsevier Inc.  

## 2015-12-07 ENCOUNTER — Telehealth: Payer: Self-pay | Admitting: Neurology

## 2015-12-07 NOTE — Telephone Encounter (Signed)
Tera with [employer] [phone] said appeal has been rec'd and a decision will be faxed within 7 days.

## 2015-12-07 NOTE — Telephone Encounter (Signed)
Sarah/[employer] [phone] called sts insurance will not cover mycophenolate (CELLCEPT) 500 MG tablet with dx of myasthenia gravis. Does pt have any other dx other than back pain.

## 2015-12-07 NOTE — Telephone Encounter (Signed)
Dr. Jannifer Franklin saw this patient in the office on 12/06/15.  He has written an appeal letter to attempt to get mycophenolate approved.  The letter has been faxed to the [place] department at [phone].

## 2015-12-08 NOTE — Telephone Encounter (Signed)
I called the patient, the insurance has denied the CellCept, may be forced to switch to Imuran, the patient is to contact me when he runs out of the CellCept.

## 2015-12-09 NOTE — Telephone Encounter (Signed)
Sarah/[employer] has called back trying to gain more information to help with the approval of Cellcept. May call [phone]

## 2015-12-12 ENCOUNTER — Encounter: Payer: Self-pay | Admitting: Internal Medicine

## 2015-12-12 NOTE — Telephone Encounter (Signed)
I called and spoke to Judson Roch with [employer].  I was placed on hold and had to hang up.  She is looking for other diagnosis that may be referenced by [facility] as other diagnosis that would allow approval (these may not be neuro diagnosis).  I will have to call her back.

## 2015-12-12 NOTE — Telephone Encounter (Signed)
Denied.

## 2015-12-14 ENCOUNTER — Telehealth: Payer: Self-pay | Admitting: Neurology

## 2015-12-14 NOTE — Telephone Encounter (Signed)
[employer] called to say mycophenolate (CELLCEPT) 500 MG tablet was denied coverage. The office and the pt will receive a letter.

## 2015-12-14 NOTE — Telephone Encounter (Signed)
Sarah/[employer] [phone] called back regarding CELLCEPT, wants to find out what other diagnoses he has.

## 2015-12-14 NOTE — Telephone Encounter (Signed)
I spoke to Judson Roch with [employer].  I relayed problem list (diagnosis's) to see if could find anything that would provide approval for cellcept.  She did relay that [facility] (last time she checked) stated that getting 100tabs of the 500mg  tabs cost $37.00.

## 2015-12-15 NOTE — Telephone Encounter (Signed)
Received denial for generic cellcept from [employer].  Pt also received call.  They gave him options of  generic imuran, prednisone, mestinon.  He has 2 months left of cellcept.  I instructed on goodrx coupons and using this (out of pocket expense).  He will think about what he wants to do.  Has appt in 05/2016.  He is to call back when medication gets about one month out.

## 2016-01-05 ENCOUNTER — Ambulatory Visit (INDEPENDENT_AMBULATORY_CARE_PROVIDER_SITE_OTHER): Payer: Medicare Other

## 2016-01-05 ENCOUNTER — Encounter: Payer: Self-pay | Admitting: *Deleted

## 2016-01-05 ENCOUNTER — Telehealth: Payer: Self-pay | Admitting: Neurology

## 2016-01-05 DIAGNOSIS — E538 Deficiency of other specified B group vitamins: Secondary | ICD-10-CM

## 2016-01-05 MED ORDER — CYANOCOBALAMIN 1000 MCG/ML IJ SOLN
1000.0000 ug | Freq: Once | INTRAMUSCULAR | Status: AC
  Administered 2016-03-06: 1000 ug via INTRAMUSCULAR

## 2016-01-05 NOTE — Telephone Encounter (Signed)
The Rx Mycophenolate (cellcept) 500 MG - he wanted Dr. Jannifer Franklin to know that his insurance company does pay for it this year and start over next year. The best number to check in [phone]

## 2016-01-05 NOTE — Progress Notes (Signed)
Pt here for B 12 injection.  Under aseptic technique cyanocobalamin 1000mcg/1ml IM given R deltoid.  Tolerated well.  Bandaid applied.  

## 2016-01-05 NOTE — Telephone Encounter (Signed)
Jessica/[employer] [phone] mycophenolate (CELLCEPT) 500 MG tablet Approved 09/11/15-09/09/16  Approval# [id number].

## 2016-01-05 NOTE — Telephone Encounter (Signed)
Jessica/[employer] [phone] mycophenolate (CELLCEPT) 500 MG tablet Approved 09/11/15-09/09/16  Approval# [id number].

## 2016-01-06 NOTE — Telephone Encounter (Signed)
I called the patient. The patient has been approved for the CellCept, he does not need a prescription currently, he is to call us 2 weeks before the prescription runs out and we will call in another prescription.

## 2016-01-09 ENCOUNTER — Ambulatory Visit: Payer: Medicare Other

## 2016-01-19 ENCOUNTER — Other Ambulatory Visit: Payer: Self-pay | Admitting: Neurology

## 2016-02-02 ENCOUNTER — Ambulatory Visit: Payer: Medicare Other

## 2016-02-02 ENCOUNTER — Telehealth: Payer: Self-pay

## 2016-02-02 NOTE — Telephone Encounter (Signed)
Pt did not show for b12 injection this afternoon. Called and left mssg for call back to reschedule.

## 2016-02-07 ENCOUNTER — Ambulatory Visit (INDEPENDENT_AMBULATORY_CARE_PROVIDER_SITE_OTHER): Payer: Medicare Other

## 2016-02-07 DIAGNOSIS — E538 Deficiency of other specified B group vitamins: Secondary | ICD-10-CM

## 2016-02-07 NOTE — Progress Notes (Signed)
Pt arrives for B 12 injection.Cyanocobalamin 1000mcg/1ml administered IM to L deltoid using aseptic technique. Pt tolerated well.Bandaid applied.  

## 2016-03-03 IMAGING — CR DG LUMBAR SPINE 2-3V
3 series · 3 of 3 positions shown · non-contrast
Comparison: 04/06/2015

CLINICAL DATA: L3 to L5 laminectomy

EXAM:
LUMBAR SPINE - 2-3 VIEW

[lat (1 of 3)]
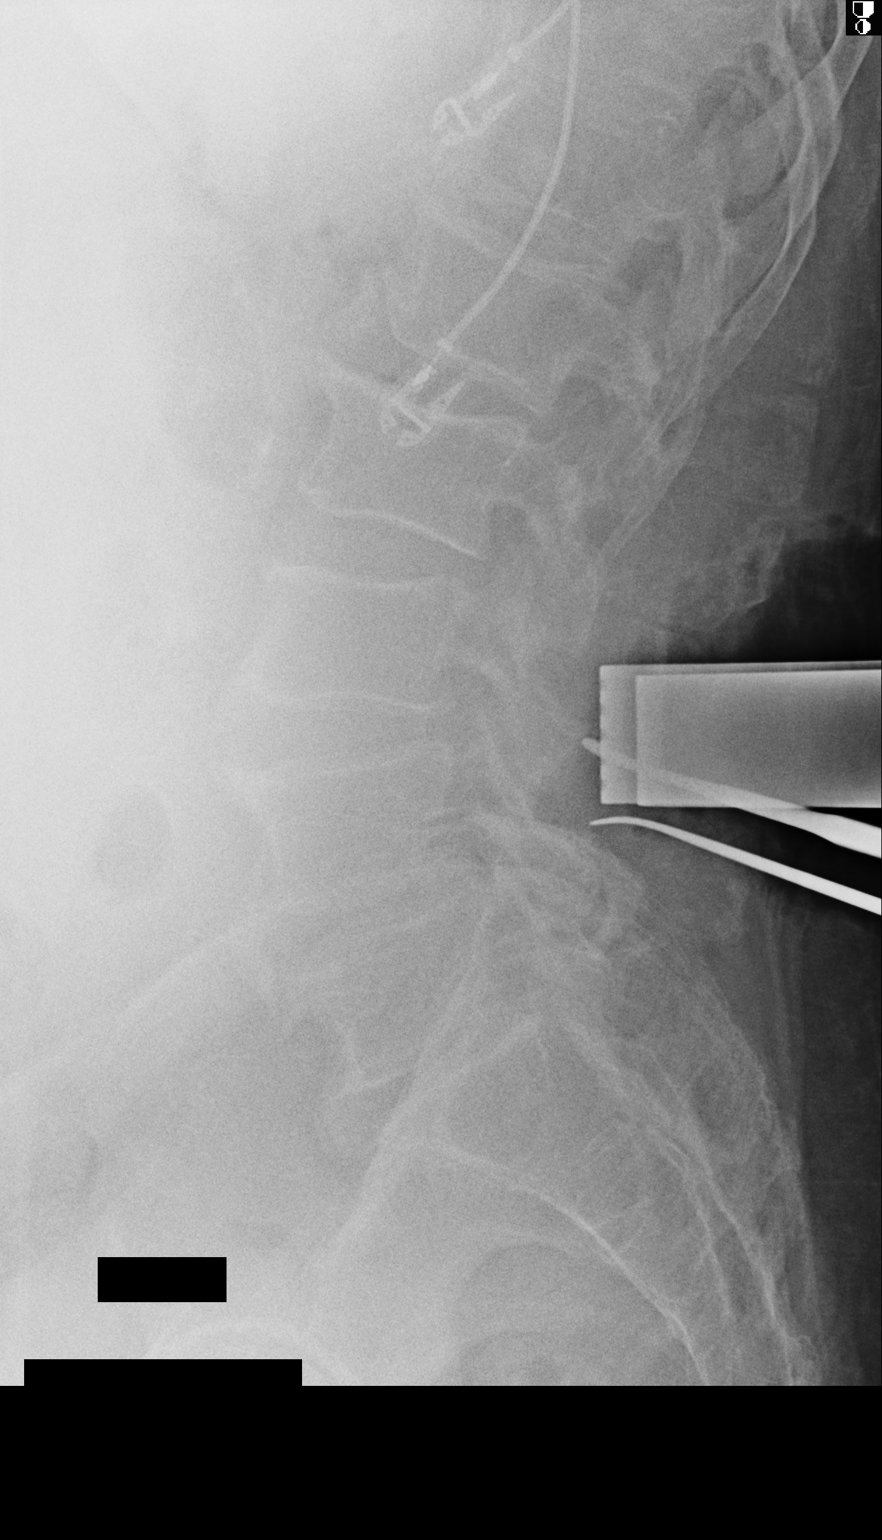

[lat (2 of 3)]
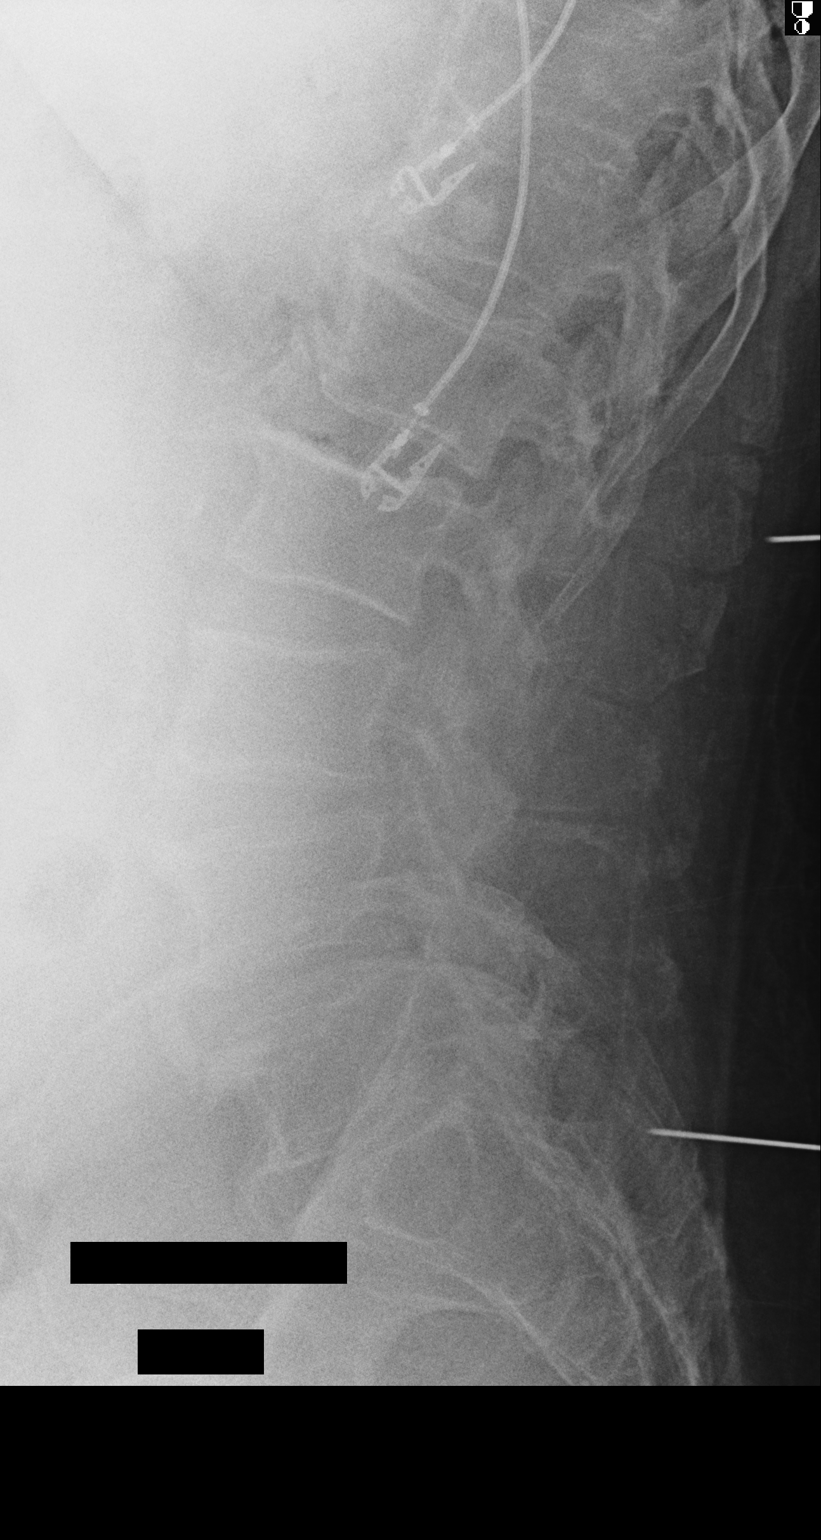

[lat (3 of 3)]
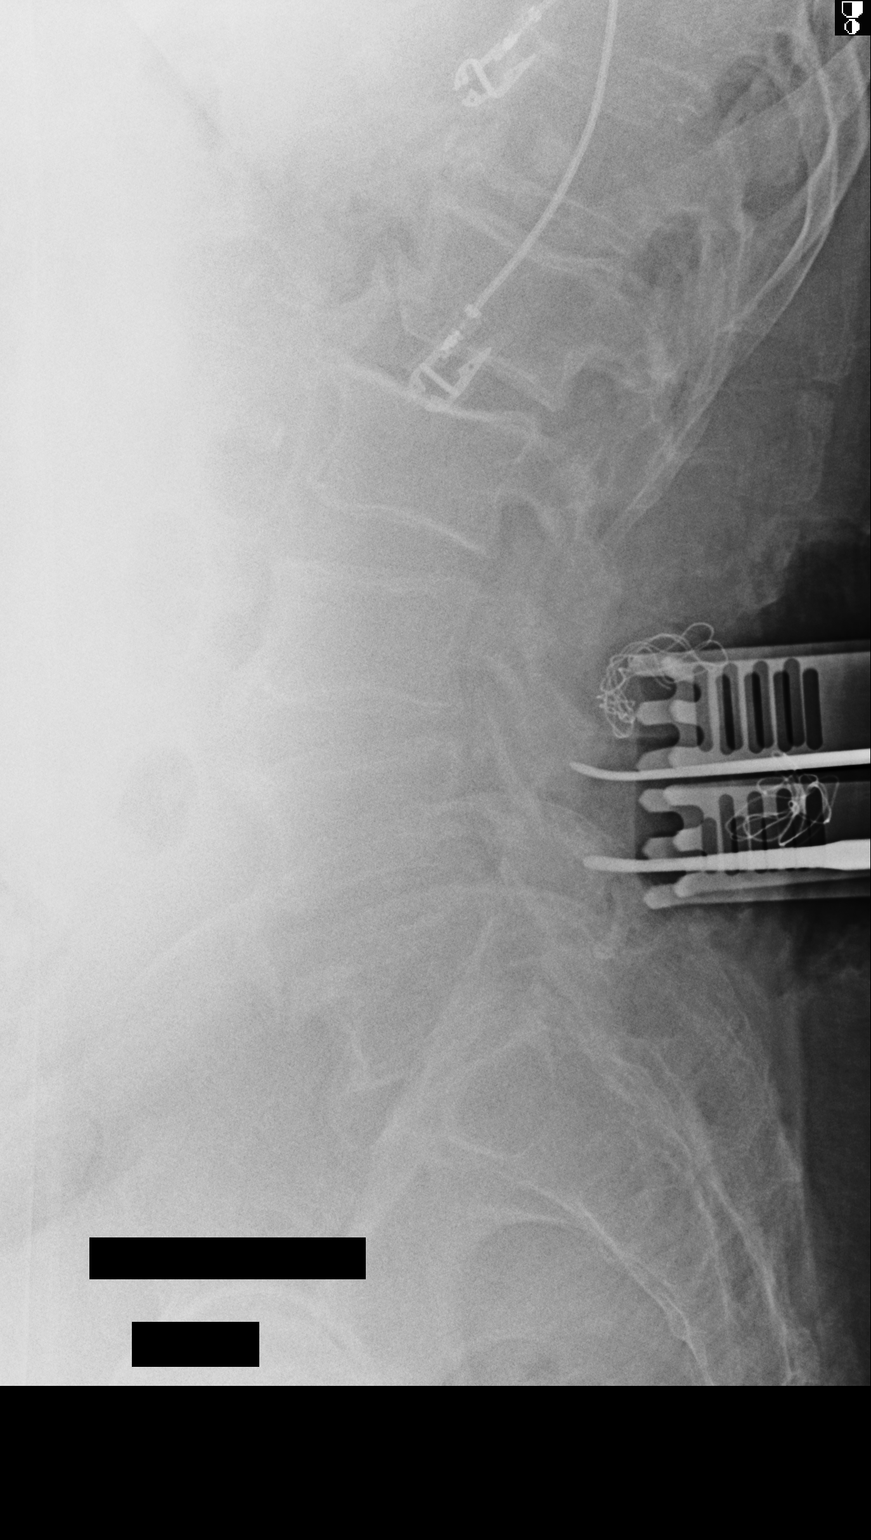

[3 of 3 positions shown; findings below may reference images not displayed]

FINDINGS: Initial image demonstrates a needle within the posterior soft
tissues at the L2 level as well as at the S1 level. Subsequent films
show surgical instruments at the L3-4 level. The third film shows
larger surgical retractor with instruments at the L3-4 and L4-5
levels.
IMPRESSION: Intraoperative localization for laminectomy.

## 2016-03-06 ENCOUNTER — Other Ambulatory Visit (INDEPENDENT_AMBULATORY_CARE_PROVIDER_SITE_OTHER): Payer: Self-pay

## 2016-03-06 ENCOUNTER — Ambulatory Visit (INDEPENDENT_AMBULATORY_CARE_PROVIDER_SITE_OTHER): Payer: Medicare Other

## 2016-03-06 ENCOUNTER — Telehealth: Payer: Self-pay | Admitting: Neurology

## 2016-03-06 DIAGNOSIS — E538 Deficiency of other specified B group vitamins: Secondary | ICD-10-CM | POA: Diagnosis not present

## 2016-03-06 DIAGNOSIS — Z5181 Encounter for therapeutic drug level monitoring: Secondary | ICD-10-CM

## 2016-03-06 DIAGNOSIS — Z0289 Encounter for other administrative examinations: Secondary | ICD-10-CM

## 2016-03-06 NOTE — Telephone Encounter (Signed)
I called the patient. He is to come and get blood work done, he is on CellCept. He is coming in today anyway for a vitamin B12 shot.

## 2016-03-06 NOTE — Progress Notes (Signed)
Pt arrives for B 12 injection.Cyanocobalamin 1000mcg/1ml administered IM to R deltoid using aseptic technique. Pt tolerated well.Bandaid applied.  

## 2016-03-07 ENCOUNTER — Telehealth: Payer: Self-pay

## 2016-03-07 LAB — CBC WITH DIFFERENTIAL/PLATELET
BASOS: 0 %
Basophils Absolute: 0 10*3/uL (ref 0.0–0.2)
EOS (ABSOLUTE): 0.1 10*3/uL (ref 0.0–0.4)
EOS: 1 %
HEMATOCRIT: 41.2 % (ref 37.5–51.0)
HEMOGLOBIN: 13.3 g/dL (ref 12.6–17.7)
Immature Grans (Abs): 0 10*3/uL (ref 0.0–0.1)
Immature Granulocytes: 0 %
LYMPHS ABS: 2.4 10*3/uL (ref 0.7–3.1)
Lymphs: 28 %
MCH: 28.7 pg (ref 26.6–33.0)
MCHC: 32.3 g/dL (ref 31.5–35.7)
MCV: 89 fL (ref 79–97)
MONOCYTES: 8 %
MONOS ABS: 0.7 10*3/uL (ref 0.1–0.9)
NEUTROS ABS: 5.4 10*3/uL (ref 1.4–7.0)
Neutrophils: 63 %
Platelets: 277 10*3/uL (ref 150–379)
RBC: 4.64 x10E6/uL (ref 4.14–5.80)
RDW: 14.3 % (ref 12.3–15.4)
WBC: 8.6 10*3/uL (ref 3.4–10.8)

## 2016-03-07 LAB — COMPREHENSIVE METABOLIC PANEL
ALBUMIN: 4 g/dL (ref 3.6–4.8)
ALT: 8 IU/L (ref 0–44)
AST: 8 IU/L (ref 0–40)
Albumin/Globulin Ratio: 2.1 (ref 1.2–2.2)
Alkaline Phosphatase: 95 IU/L (ref 39–117)
BUN / CREAT RATIO: 15 (ref 10–24)
BUN: 16 mg/dL (ref 8–27)
Bilirubin Total: 0.5 mg/dL (ref 0.0–1.2)
CO2: 25 mmol/L (ref 18–29)
CREATININE: 1.06 mg/dL (ref 0.76–1.27)
Calcium: 8.9 mg/dL (ref 8.6–10.2)
Chloride: 103 mmol/L (ref 96–106)
GFR calc non Af Amer: 72 mL/min/{1.73_m2} (ref >59)
GFR, EST AFRICAN AMERICAN: 83 mL/min/{1.73_m2} (ref >59)
GLOBULIN, TOTAL: 1.9 g/dL (ref 1.5–4.5)
GLUCOSE: 137 mg/dL — AB (ref 65–99)
Potassium: 4.9 mmol/L (ref 3.5–5.2)
SODIUM: 144 mmol/L (ref 134–144)
TOTAL PROTEIN: 5.9 g/dL — AB (ref 6.0–8.5)

## 2016-03-07 NOTE — Telephone Encounter (Signed)
Called pt w/ unremarkable lab results. Reviewed slightly low protein. Verbalized understanding and appreciation for call.

## 2016-03-07 NOTE — Telephone Encounter (Signed)
-----   Message from Kathrynn Ducking, MD sent at 03/07/2016  7:17 AM EDT -----  The blood work results are unremarkable, with exception of a minimally low total protein, not clinically significant. Please call the patient. ----- Message -----    From: [facility] Lab Results In Interface    Sent: 03/07/2016   5:41 AM      To: Kathrynn Ducking, MD

## 2016-04-05 ENCOUNTER — Ambulatory Visit: Payer: Medicare Other

## 2016-04-10 ENCOUNTER — Other Ambulatory Visit: Payer: Self-pay

## 2016-04-10 ENCOUNTER — Ambulatory Visit (INDEPENDENT_AMBULATORY_CARE_PROVIDER_SITE_OTHER): Payer: Medicare Other

## 2016-04-10 DIAGNOSIS — E538 Deficiency of other specified B group vitamins: Secondary | ICD-10-CM | POA: Diagnosis not present

## 2016-04-10 MED ORDER — CYANOCOBALAMIN 1000 MCG/ML IJ SOLN: 1000.0000 ug | Freq: Once | INTRAMUSCULAR | 0 refills | Status: DC

## 2016-04-10 MED ORDER — CYANOCOBALAMIN 1000 MCG/ML IJ SOLN
1000.0000 ug | Freq: Once | INTRAMUSCULAR | Status: AC
  Administered 2016-04-10: 1000 ug via INTRAMUSCULAR

## 2016-05-09 ENCOUNTER — Ambulatory Visit (INDEPENDENT_AMBULATORY_CARE_PROVIDER_SITE_OTHER): Payer: Medicare Other | Admitting: *Deleted

## 2016-05-09 ENCOUNTER — Encounter: Payer: Self-pay | Admitting: *Deleted

## 2016-05-09 DIAGNOSIS — E538 Deficiency of other specified B group vitamins: Secondary | ICD-10-CM | POA: Diagnosis not present

## 2016-05-09 MED ORDER — CYANOCOBALAMIN 1000 MCG/ML IJ SOLN
1000.0000 ug | INTRAMUSCULAR | Status: DC
  Administered 2016-05-09 – 2017-06-26 (×14): 1000 ug via INTRAMUSCULAR

## 2016-06-05 ENCOUNTER — Ambulatory Visit: Payer: Medicare Other

## 2016-06-05 ENCOUNTER — Ambulatory Visit: Payer: Medicare Other | Admitting: Neurology

## 2016-06-06 ENCOUNTER — Ambulatory Visit (INDEPENDENT_AMBULATORY_CARE_PROVIDER_SITE_OTHER): Payer: Medicare Other

## 2016-06-06 DIAGNOSIS — E538 Deficiency of other specified B group vitamins: Secondary | ICD-10-CM | POA: Diagnosis not present

## 2016-06-06 NOTE — Progress Notes (Signed)
Pt arrives for B 12 injection.Cyanocobalamin 1000mcg/1ml administered IM to L deltoid using aseptic technique. Pt tolerated well.Bandaid applied.  

## 2016-06-07 ENCOUNTER — Ambulatory Visit: Payer: Medicare Other

## 2016-06-11 ENCOUNTER — Telehealth: Payer: Self-pay

## 2016-06-11 ENCOUNTER — Ambulatory Visit (INDEPENDENT_AMBULATORY_CARE_PROVIDER_SITE_OTHER): Payer: Medicare Other | Admitting: Neurology

## 2016-06-11 ENCOUNTER — Encounter: Payer: Self-pay | Admitting: Neurology

## 2016-06-11 VITALS — BP 131/77 | HR 66 | Ht 68.0 in | Wt 263.0 lb

## 2016-06-11 DIAGNOSIS — Z5181 Encounter for therapeutic drug level monitoring: Secondary | ICD-10-CM | POA: Diagnosis not present

## 2016-06-11 MED ORDER — MYCOPHENOLATE MOFETIL 500 MG PO TABS: ORAL_TABLET | ORAL | Status: DC

## 2016-06-11 NOTE — Telephone Encounter (Signed)
Error - pt arrived late.

## 2016-06-11 NOTE — Patient Instructions (Signed)
   Reduce the Cellcept 500 mg tablet to one in the morning and 2 in the evening.

## 2016-06-11 NOTE — Telephone Encounter (Signed)
Pt no-showed his follow-up appt this morning.

## 2016-06-11 NOTE — Progress Notes (Signed)
Reason for visit: Myasthenia gravis  Joshua [name] is an 70 y.o. male  History of present illness:  Joshua [name] is a 70 year old right-handed white male with a history of myasthenia gravis. The patient has not had any significant issues with the disease since last seen. He denies any double vision, ptosis, difficulty chewing or swallowing. The patient indicates that his diabetes is under good control. He is on Mestinon and CellCept, tolerating the medications well. He does not require prednisone therapy. He returns to this office for an evaluation.  Past Medical History:  Diagnosis Date  . Anemia   . Anxiety   . Arthritis   . Basal cell carcinoma   . Carpal tunnel syndrome, bilateral   . Chronic low back pain 12/07/2014  . Colon cancer ([place])   . Colon polyps   . Diabetes ([place])   . Diverticulosis   . DJD (degenerative joint disease)   . GERD (gastroesophageal reflux disease)   . Hemorrhoids   . Hyperlipidemia   . Hypertension   . Morbid obesity ([place])   . Myasthenia gravis ([place])   . Myasthenia gravis without exacerbation ([place]) 05/19/2013  . Vitamin B12 deficiency     Past Surgical History:  Procedure Laterality Date  . ANKLE ARTHROPLASTY Right 1969  . BASAL CELL CARCINOMA EXCISION     face  . CARPAL TUNNEL WITH CUBITAL TUNNEL Left 06/02/2013   Procedure: CARPAL TUNNEL WITH CUBITAL TUNNEL;  Surgeon: Tennis Must, MD;  Location: [facility];  Service: Orthopedics;  Laterality: Left;  . CARPAL TUNNEL WITH CUBITAL TUNNEL Right 07/14/2013   Procedure: RIGHT CARPAL TUNNEL AND CUBITAL TUNNEL RELEASE;  Surgeon: Tennis Must, MD;  Location: [facility];  Service: Orthopedics;  Laterality: Right;  . LUMBAR LAMINECTOMY/DECOMPRESSION MICRODISCECTOMY N/A 05/02/2015   Procedure: LUMBAR LAMINECTOMY/DECOMPRESSION MICRODISCECTOMY 2 LEVELS;  Surgeon: Jovita Gamma, MD;  Location: [facility];  Service: Neurosurgery;  Laterality: N/A;  L3 L4 L5 laminectomies  .  PATELLA FRACTURE SURGERY Left 2011  . RIGHT COLECTOMY  2006  . TONSILLECTOMY  1968    Family History  Problem Relation Age of Onset  . Heart disease Mother   . Prostate cancer Maternal Grandfather   . Colon polyps Maternal Grandfather   . Colon cancer Neg Hx     Social history:  reports that he has been smoking Cigars.  He has never used smokeless tobacco. He reports that he drinks alcohol. He reports that he does not use drugs.    Allergies  Allergen Reactions  . Aluminum Chlorohydrate Rash  . Niacin And Related Rash    Medications:  Prior to Admission medications   Medication Sig Start Date End Date Taking? Authorizing Provider  aspirin EC 81 MG tablet Take 81 mg by mouth daily.   Yes Historical Provider, MD  atorvastatin (LIPITOR) 40 MG tablet Take 40 mg by mouth daily.   Yes Historical Provider, MD  B-D ULTRAFINE III SHORT PEN 31G X 8 MM MISC 200 each by Other route 3 (three) times daily between meals. 04/09/13  Yes Historical Provider, MD  busPIRone (BUSPAR) 7.5 MG tablet TAKE 1 TABLET (7.5 MG TOTAL) BY MOUTH 2 (TWO) TIMES DAILY. 06/18/15  Yes Kathrynn Ducking, MD  cetirizine (ZYRTEC) 10 MG tablet Take 10 mg by mouth daily.   Yes Historical Provider, MD  famotidine (PEPCID) 20 MG tablet Take 20 mg by mouth 2 (two) times daily.   Yes Historical Provider, MD  insulin aspart  protamine-insulin aspart (NOVOLOG 70/30) (70-30) 100 UNIT/ML injection Inject 18-48 Units into the skin 3 (three) times daily. 48 units with breakfast and before bed. 18 units with lunch   Yes Historical Provider, MD  INVOKANA 300 MG TABS Take 1 tablet by mouth daily.  11/26/12  Yes Historical Provider, MD  lisinopril (PRINIVIL,ZESTRIL) 5 MG tablet Take 5 mg by mouth daily. 11/27/13  Yes Historical Provider, MD  metFORMIN (GLUCOPHAGE) 1000 MG tablet Take 1,000 mg by mouth at bedtime.  09/25/12  Yes Historical Provider, MD  Multiple Vitamins-Minerals (OCUVITE PO) Take 1 tablet by mouth daily.   Yes Historical  Provider, MD  mycophenolate (CELLCEPT) 500 MG tablet TAKE 2 TABLET BY MOUTH TWICE DAILY 01/20/16  Yes Kathrynn Ducking, MD  ONE TOUCH ULTRA TEST test strip 200 each by Other route 3 (three) times daily. 04/09/13  Yes Historical Provider, MD  Jonetta Speak LANCETS [id number] MISC 100 each by Other route 3 (three) times daily. 04/14/13  Yes Historical Provider, MD  pyridostigmine (MESTINON) 60 MG tablet TAKE 0.5 TABLETS (30 MG TOTAL) BY MOUTH 3 (THREE) TIMES DAILY. 12/06/15  Yes Kathrynn Ducking, MD  traMADol (ULTRAM) 50 MG tablet Take 100 mg by mouth 3 (three) times daily.  09/09/12  Yes Historical Provider, MD    ROS:  Out of a complete 14 system review of symptoms, the patient complains only of the following symptoms, and all other reviewed systems are negative.  Fatigue  Blood pressure 131/77, pulse 66, height 5\' 8"  (1.727 m), weight 263 lb (119.3 kg).  Physical Exam  General: The patient is alert and cooperative at the time of the examination. The patient is moderately to markedly obese.  Skin: No significant peripheral edema is noted.   Neurologic Exam  Mental status: The patient is alert and oriented x 3 at the time of the examination. The patient has apparent normal recent and remote memory, with an apparently normal attention span and concentration ability.   Cranial nerves: Facial symmetry is present. Speech is normal, no aphasia or dysarthria is noted. Extraocular movements are full. Visual fields are full. With superior gaze for 1 minute, no divergence of gaze, subjective double vision, or ptosis was noted.  Motor: The patient has good strength in all 4 extremities. With arms outstretched for 1 minute, no fatigable weakness of the deltoid muscles was noted.  Sensory examination: Soft touch sensation is symmetric on the face, arms, and legs.  Coordination: The patient has good finger-nose-finger and heel-to-shin bilaterally.  Gait and station: The patient has a normal gait. Tandem  gait is normal. Romberg is negative. No drift is seen.  Reflexes: Deep tendon reflexes are symmetric.   Assessment/Plan:  1. Myasthenia gravis  The patient is doing well at this time, he has been stable on the current dose of medications. We will reduce the CellCept taking 500 mg in the morning and 1000 mg in the evening. Blood work will be done today, he will follow-up in 6 months.  Jill Alexanders MD 06/11/2016 10:33 AM  [facility] [address] [place], [place] [address]  Phone [phone] Fax [phone]

## 2016-06-12 ENCOUNTER — Telehealth: Payer: Self-pay

## 2016-06-12 LAB — COMPREHENSIVE METABOLIC PANEL
ALBUMIN: 4 g/dL (ref 3.6–4.8)
ALK PHOS: 85 IU/L (ref 39–117)
ALT: 8 IU/L (ref 0–44)
AST: 10 IU/L (ref 0–40)
Albumin/Globulin Ratio: 1.9 (ref 1.2–2.2)
BUN / CREAT RATIO: 19 (ref 10–24)
BUN: 19 mg/dL (ref 8–27)
Bilirubin Total: 0.4 mg/dL (ref 0.0–1.2)
CALCIUM: 9.2 mg/dL (ref 8.6–10.2)
CO2: 26 mmol/L (ref 18–29)
CREATININE: 0.98 mg/dL (ref 0.76–1.27)
Chloride: 106 mmol/L (ref 96–106)
GFR calc Af Amer: 91 mL/min/{1.73_m2} (ref >59)
GFR, EST NON AFRICAN AMERICAN: 78 mL/min/{1.73_m2} (ref >59)
GLUCOSE: 117 mg/dL — AB (ref 65–99)
Globulin, Total: 2.1 g/dL (ref 1.5–4.5)
Potassium: 4.9 mmol/L (ref 3.5–5.2)
Sodium: 144 mmol/L (ref 134–144)
TOTAL PROTEIN: 6.1 g/dL (ref 6.0–8.5)

## 2016-06-12 LAB — CBC WITH DIFFERENTIAL/PLATELET
Basophils Absolute: 0 10*3/uL (ref 0.0–0.2)
Basos: 0 %
EOS (ABSOLUTE): 0.2 10*3/uL (ref 0.0–0.4)
EOS: 2 %
HEMATOCRIT: 41.1 % (ref 37.5–51.0)
HEMOGLOBIN: 13.5 g/dL (ref 12.6–17.7)
IMMATURE GRANULOCYTES: 0 %
Immature Grans (Abs): 0 10*3/uL (ref 0.0–0.1)
LYMPHS ABS: 2.6 10*3/uL (ref 0.7–3.1)
Lymphs: 34 %
MCH: 29.7 pg (ref 26.6–33.0)
MCHC: 32.8 g/dL (ref 31.5–35.7)
MCV: 90 fL (ref 79–97)
MONOCYTES: 11 %
Monocytes Absolute: 0.9 10*3/uL (ref 0.1–0.9)
NEUTROS PCT: 53 %
Neutrophils Absolute: 4 10*3/uL (ref 1.4–7.0)
Platelets: 294 10*3/uL (ref 150–379)
RBC: 4.55 x10E6/uL (ref 4.14–5.80)
RDW: 14.3 % (ref 12.3–15.4)
WBC: 7.6 10*3/uL (ref 3.4–10.8)

## 2016-06-12 NOTE — Telephone Encounter (Signed)
Called pt w/ unremarkable lab results. Verbalized understanding and appreciation for call. 

## 2016-06-12 NOTE — Telephone Encounter (Signed)
-----   Message from Kathrynn Ducking, MD sent at 06/12/2016  7:20 AM EDT -----  The blood work results are unremarkable. Please call the patient.  ----- Message ----- From: [facility] Lab Results In Sent: 06/12/2016   5:40 AM To: Kathrynn Ducking, MD

## 2016-07-05 ENCOUNTER — Ambulatory Visit (INDEPENDENT_AMBULATORY_CARE_PROVIDER_SITE_OTHER): Payer: Medicare Other

## 2016-07-05 DIAGNOSIS — E538 Deficiency of other specified B group vitamins: Secondary | ICD-10-CM | POA: Diagnosis not present

## 2016-07-05 NOTE — Progress Notes (Signed)
Pt arrives for B 12 injection.Cyanocobalamin 1000mcg/1ml administered IM to R deltoid using aseptic technique. Pt tolerated well.Bandaid applied.  

## 2016-08-09 ENCOUNTER — Ambulatory Visit: Payer: Medicare Other

## 2016-08-13 ENCOUNTER — Ambulatory Visit (INDEPENDENT_AMBULATORY_CARE_PROVIDER_SITE_OTHER): Payer: Medicare Other

## 2016-08-13 DIAGNOSIS — E538 Deficiency of other specified B group vitamins: Secondary | ICD-10-CM | POA: Diagnosis not present

## 2016-08-13 NOTE — Progress Notes (Signed)
Pt arrives for B 12 injection.Cyanocobalamin 1000mcg/1ml administered IM to L deltoid using aseptic technique. Pt tolerated well.Bandaid applied.  

## 2016-08-26 ENCOUNTER — Other Ambulatory Visit: Payer: Self-pay | Admitting: Neurology

## 2016-09-11 ENCOUNTER — Telehealth: Payer: Self-pay | Admitting: Neurology

## 2016-09-11 DIAGNOSIS — Z5181 Encounter for therapeutic drug level monitoring: Secondary | ICD-10-CM

## 2016-09-11 NOTE — Telephone Encounter (Signed)
I called the patient. He is to come in for blood work on the Cellcept.

## 2016-09-13 ENCOUNTER — Other Ambulatory Visit (INDEPENDENT_AMBULATORY_CARE_PROVIDER_SITE_OTHER): Payer: Self-pay

## 2016-09-13 ENCOUNTER — Ambulatory Visit (INDEPENDENT_AMBULATORY_CARE_PROVIDER_SITE_OTHER): Payer: PPO

## 2016-09-13 DIAGNOSIS — E538 Deficiency of other specified B group vitamins: Secondary | ICD-10-CM

## 2016-09-13 DIAGNOSIS — Z0289 Encounter for other administrative examinations: Secondary | ICD-10-CM

## 2016-09-13 DIAGNOSIS — Z5181 Encounter for therapeutic drug level monitoring: Secondary | ICD-10-CM | POA: Diagnosis not present

## 2016-09-13 NOTE — Progress Notes (Signed)
Pt arrives for B 12 injection.Cyanocobalamin 1000mcg/1ml administered IM to L deltoid using aseptic technique. Pt tolerated well.Bandaid applied.  

## 2016-09-14 ENCOUNTER — Telehealth: Payer: Self-pay

## 2016-09-14 LAB — COMPREHENSIVE METABOLIC PANEL
A/G RATIO: 2.1 (ref 1.2–2.2)
ALBUMIN: 3.9 g/dL (ref 3.6–4.8)
ALT: 12 IU/L (ref 0–44)
AST: 8 IU/L (ref 0–40)
Alkaline Phosphatase: 82 IU/L (ref 39–117)
BUN / CREAT RATIO: 16 (ref 10–24)
BUN: 16 mg/dL (ref 8–27)
Bilirubin Total: 0.7 mg/dL (ref 0.0–1.2)
CALCIUM: 8.8 mg/dL (ref 8.6–10.2)
CO2: 27 mmol/L (ref 18–29)
CREATININE: 0.99 mg/dL (ref 0.76–1.27)
Chloride: 106 mmol/L (ref 96–106)
GFR calc Af Amer: 89 mL/min/{1.73_m2} (ref >59)
GFR, EST NON AFRICAN AMERICAN: 77 mL/min/{1.73_m2} (ref >59)
GLOBULIN, TOTAL: 1.9 g/dL (ref 1.5–4.5)
Glucose: 179 mg/dL — ABNORMAL HIGH (ref 65–99)
POTASSIUM: 4.8 mmol/L (ref 3.5–5.2)
SODIUM: 143 mmol/L (ref 134–144)
Total Protein: 5.8 g/dL — ABNORMAL LOW (ref 6.0–8.5)

## 2016-09-14 LAB — CBC WITH DIFFERENTIAL/PLATELET
BASOS: 0 %
Basophils Absolute: 0 10*3/uL (ref 0.0–0.2)
EOS (ABSOLUTE): 0.1 10*3/uL (ref 0.0–0.4)
EOS: 1 %
HEMATOCRIT: 40.3 % (ref 37.5–51.0)
Hemoglobin: 13.3 g/dL (ref 13.0–17.7)
Immature Grans (Abs): 0 10*3/uL (ref 0.0–0.1)
Immature Granulocytes: 0 %
LYMPHS ABS: 1.5 10*3/uL (ref 0.7–3.1)
Lymphs: 19 %
MCH: 29.6 pg (ref 26.6–33.0)
MCHC: 33 g/dL (ref 31.5–35.7)
MCV: 90 fL (ref 79–97)
MONOS ABS: 0.6 10*3/uL (ref 0.1–0.9)
Monocytes: 8 %
NEUTROS ABS: 5.5 10*3/uL (ref 1.4–7.0)
Neutrophils: 72 %
Platelets: 286 10*3/uL (ref 150–379)
RBC: 4.49 x10E6/uL (ref 4.14–5.80)
RDW: 14.3 % (ref 12.3–15.4)
WBC: 7.7 10*3/uL (ref 3.4–10.8)

## 2016-09-14 NOTE — Telephone Encounter (Signed)
-----   Message from Kathrynn Ducking, MD sent at 09/14/2016  7:24 AM EST ----- Blood work is relatively unremarkable, elevated random glucose level, slightly low total protein level that has been seen previously, stable. No clinical concerns. Please call the patient. ----- Message ----- From: Lavone Neri Lab Results In Sent: 09/14/2016   5:39 AM To: Kathrynn Ducking, MD

## 2016-09-14 NOTE — Telephone Encounter (Signed)
Called pt w/ lab results. Verbalized understanding and appreciation for call. 

## 2016-10-15 ENCOUNTER — Ambulatory Visit: Payer: PPO

## 2016-10-18 ENCOUNTER — Ambulatory Visit (INDEPENDENT_AMBULATORY_CARE_PROVIDER_SITE_OTHER): Payer: PPO | Admitting: *Deleted

## 2016-10-18 DIAGNOSIS — E538 Deficiency of other specified B group vitamins: Secondary | ICD-10-CM | POA: Diagnosis not present

## 2016-10-18 NOTE — Progress Notes (Signed)
Gave Vitamin B12 IM in right deltoid. Cleaned with alcohol wipe prior to injection. Pt tolerated well.

## 2016-10-30 DIAGNOSIS — M545 Low back pain: Secondary | ICD-10-CM | POA: Diagnosis not present

## 2016-10-30 DIAGNOSIS — I10 Essential (primary) hypertension: Secondary | ICD-10-CM | POA: Diagnosis not present

## 2016-10-30 DIAGNOSIS — E784 Other hyperlipidemia: Secondary | ICD-10-CM | POA: Diagnosis not present

## 2016-10-30 DIAGNOSIS — E1159 Type 2 diabetes mellitus with other circulatory complications: Secondary | ICD-10-CM | POA: Diagnosis not present

## 2016-10-30 DIAGNOSIS — F17200 Nicotine dependence, unspecified, uncomplicated: Secondary | ICD-10-CM | POA: Diagnosis not present

## 2016-10-30 DIAGNOSIS — Z6838 Body mass index (BMI) 38.0-38.9, adult: Secondary | ICD-10-CM | POA: Diagnosis not present

## 2016-10-30 DIAGNOSIS — G7000 Myasthenia gravis without (acute) exacerbation: Secondary | ICD-10-CM | POA: Diagnosis not present

## 2016-10-30 DIAGNOSIS — F5104 Psychophysiologic insomnia: Secondary | ICD-10-CM | POA: Diagnosis not present

## 2016-11-15 ENCOUNTER — Ambulatory Visit (INDEPENDENT_AMBULATORY_CARE_PROVIDER_SITE_OTHER): Payer: PPO | Admitting: *Deleted

## 2016-11-15 DIAGNOSIS — E538 Deficiency of other specified B group vitamins: Secondary | ICD-10-CM | POA: Diagnosis not present

## 2016-11-15 NOTE — Progress Notes (Signed)
Patient in office for Vitamin B12 injection. Patient identified per protocol, injection given per protocol. patient tolerated well.

## 2016-12-10 ENCOUNTER — Ambulatory Visit: Payer: Medicare Other | Admitting: Adult Health

## 2016-12-17 ENCOUNTER — Ambulatory Visit (INDEPENDENT_AMBULATORY_CARE_PROVIDER_SITE_OTHER): Payer: PPO | Admitting: *Deleted

## 2016-12-17 DIAGNOSIS — E538 Deficiency of other specified B group vitamins: Secondary | ICD-10-CM | POA: Diagnosis not present

## 2016-12-17 NOTE — Progress Notes (Signed)
Gave Vitamin B12 IM in left deltoid. Cleaned with alcohol wipe prior to injection. Band-aid applied. Pt tolerated well.   

## 2016-12-20 ENCOUNTER — Ambulatory Visit (INDEPENDENT_AMBULATORY_CARE_PROVIDER_SITE_OTHER): Payer: PPO | Admitting: Adult Health

## 2016-12-20 ENCOUNTER — Encounter (INDEPENDENT_AMBULATORY_CARE_PROVIDER_SITE_OTHER): Payer: Self-pay

## 2016-12-20 ENCOUNTER — Encounter: Payer: Self-pay | Admitting: Adult Health

## 2016-12-20 ENCOUNTER — Ambulatory Visit: Payer: PPO | Admitting: Adult Health

## 2016-12-20 VITALS — BP 127/69 | HR 88 | Ht 68.0 in | Wt 261.6 lb

## 2016-12-20 DIAGNOSIS — Z5181 Encounter for therapeutic drug level monitoring: Secondary | ICD-10-CM | POA: Diagnosis not present

## 2016-12-20 DIAGNOSIS — G7000 Myasthenia gravis without (acute) exacerbation: Secondary | ICD-10-CM

## 2016-12-20 DIAGNOSIS — E538 Deficiency of other specified B group vitamins: Secondary | ICD-10-CM

## 2016-12-20 NOTE — Patient Instructions (Signed)
Continue mestinon and cellcept mini-mental status exam

## 2016-12-20 NOTE — Progress Notes (Signed)
I have read the note, and I agree with the clinical assessment and plan.  Dai Apel KEITH   

## 2016-12-20 NOTE — Progress Notes (Addendum)
PATIENT: Joshua [name] DOB: 1946-01-22  REASON FOR VISIT: follow up- myasthenia gravis HISTORY FROM: patient  HISTORY OF PRESENT ILLNESS: Joshua [name] is a 71 year old male with a history of myasthenia gravis. He returns today for follow-up. At the last visit CellCept was decreased to 500 mg in the morning and 1000 mg in the evening. He is also on Mestinon 60 mg half a tablet twice a day. He reports that he is tolerating the medication well. He denies any symptoms of myasthenia. Denies double vision or ptosis. Denies weakness in the limbs. He does report occasional drooling. Denies any trouble swallowing or chewing. Denies any trouble breathing. Overall he is remained stable. He has been getting vitamin B12 injections for low levels. He returns today for an evaluation.   HISTORY Joshua [name] is a 71 year old right-handed white male with a history of myasthenia gravis. The patient has not had any significant issues with the disease since last seen. He denies any double vision, ptosis, difficulty chewing or swallowing. The patient indicates that his diabetes is under good control. He is on Mestinon and CellCept, tolerating the medications well. He does not require prednisone therapy. He returns to this office for an evaluation    REVIEW OF SYSTEMS: Out of a complete 14 system review of symptoms, the patient complains only of the following symptoms, and all other reviewed systems are negative.  Shortness of breath, eye itching, eye redness, fatigue, ringing in ears, insomnia, frequent waking, daytime sleepiness, joint pain, back pain, muscle cramps, neck pain, neck stiffness, moles, urgency, environmental allergies, decreased concentration, nervous/anxious  ALLERGIES: Allergies  Allergen Reactions  . Aluminum Chlorohydrate Rash  . Niacin And Related Rash    HOME MEDICATIONS: Outpatient Medications Prior to Visit  Medication Sig Dispense Refill  . aspirin EC 81 MG tablet Take 81 mg by mouth  daily.    Marland Kitchen atorvastatin (LIPITOR) 40 MG tablet Take 40 mg by mouth daily.    . B-D ULTRAFINE III SHORT PEN 31G X 8 MM MISC 200 each by Other route 3 (three) times daily between meals.    . busPIRone (BUSPAR) 7.5 MG tablet TAKE 1 TABLET BY MOUTH TWICE DAILY 180 tablet 3  . cetirizine (ZYRTEC) 10 MG tablet Take 10 mg by mouth daily.    . famotidine (PEPCID) 20 MG tablet Take 20 mg by mouth 2 (two) times daily.    . insulin aspart protamine-insulin aspart (NOVOLOG 70/30) (70-30) 100 UNIT/ML injection Inject 18-48 Units into the skin 3 (three) times daily. 48 units with breakfast and before bed. 18 units with lunch    . INVOKANA 300 MG TABS Take 1 tablet by mouth daily.     Marland Kitchen lisinopril (PRINIVIL,ZESTRIL) 5 MG tablet Take 5 mg by mouth daily.    . metFORMIN (GLUCOPHAGE) 1000 MG tablet Take 1,000 mg by mouth at bedtime.     . Multiple Vitamins-Minerals (OCUVITE PO) Take 1 tablet by mouth daily.    . mycophenolate (CELLCEPT) 500 MG tablet One tablet in the morning and 2 in the evening    . ONE TOUCH ULTRA TEST test strip 200 each by Other route 3 (three) times daily.    Glory Rosebush DELICA LANCETS 01X MISC 100 each by Other route 3 (three) times daily.    Marland Kitchen pyridostigmine (MESTINON) 60 MG tablet TAKE 0.5 TABLETS (30 MG TOTAL) BY MOUTH 3 (THREE) TIMES DAILY. 135 tablet 3  . traMADol (ULTRAM) 50 MG tablet Take 100 mg by mouth 3 (three) times  daily.      Facility-Administered Medications Prior to Visit  Medication Dose Route Frequency Provider Last Rate Last Dose  . cyanocobalamin ((VITAMIN B-12)) injection 1,000 mcg  1,000 mcg Intramuscular Q30 days Kathrynn Ducking, MD   1,000 mcg at 12/17/16 1630    PAST MEDICAL HISTORY: Past Medical History:  Diagnosis Date  . Anemia   . Anxiety   . Arthritis   . Basal cell carcinoma   . Carpal tunnel syndrome, bilateral   . Chronic low back pain 12/07/2014  . Colon cancer ([place])   . Colon polyps   . Diabetes ([place])   . Diverticulosis   . DJD (degenerative  joint disease)   . GERD (gastroesophageal reflux disease)   . Hemorrhoids   . Hyperlipidemia   . Hypertension   . Morbid obesity ([place])   . Myasthenia gravis ([place])   . Myasthenia gravis without exacerbation ([place]) 05/19/2013  . Vitamin B12 deficiency     PAST SURGICAL HISTORY: Past Surgical History:  Procedure Laterality Date  . ANKLE ARTHROPLASTY Right 1969  . BASAL CELL CARCINOMA EXCISION     face  . CARPAL TUNNEL WITH CUBITAL TUNNEL Left 06/02/2013   Procedure: CARPAL TUNNEL WITH CUBITAL TUNNEL;  Surgeon: Tennis Must, MD;  Location: [place];  Service: Orthopedics;  Laterality: Left;  . CARPAL TUNNEL WITH CUBITAL TUNNEL Right 07/14/2013   Procedure: RIGHT CARPAL TUNNEL AND CUBITAL TUNNEL RELEASE;  Surgeon: Tennis Must, MD;  Location: [facility];  Service: Orthopedics;  Laterality: Right;  . LUMBAR LAMINECTOMY/DECOMPRESSION MICRODISCECTOMY N/A 05/02/2015   Procedure: LUMBAR LAMINECTOMY/DECOMPRESSION MICRODISCECTOMY 2 LEVELS;  Surgeon: Jovita Gamma, MD;  Location: [facility];  Service: Neurosurgery;  Laterality: N/A;  L3 L4 L5 laminectomies  . PATELLA FRACTURE SURGERY Left 2011  . RIGHT COLECTOMY  2006  . TONSILLECTOMY  1968    FAMILY HISTORY: Family History  Problem Relation Age of Onset  . Heart disease Mother   . Prostate cancer Maternal Grandfather   . Colon polyps Maternal Grandfather   . Colon cancer Neg Hx     SOCIAL HISTORY: Social History   Social History  . Marital status: Married    Spouse name: N/A  . Number of children: 1  . Years of education: college   Occupational History  . Retired Astronomer [employer]   Social History Main Topics  . Smoking status: Current Every Day Smoker    Types: Cigars  . Smokeless tobacco: Never Used  . Alcohol use Yes     Comment: 6 pack a year  . Drug use: No  . Sexual activity: Not on file   Other Topics Concern  . Not on file   Social History Narrative   Lives at home w/ his wife,  who has Alzheimer's   Patient is right handed.   Patient drinks 2 cups of caffeine daily.      PHYSICAL EXAM  Vitals:   12/20/16 1519  BP: 127/69  Pulse: 88  Weight: 261 lb 9.6 oz (118.7 kg)  Height: 5\' 8"  (1.727 m)   Body mass index is 39.78 kg/m.  Generalized: Well developed, in no acute distress   Neurological examination  Mentation: Alert oriented to time, place, history taking. Follows all commands speech and language fluent Cranial nerve II-XII: Pupils were equal round reactive to light. Extraocular movements were full, visual field were full on confrontational test. Facial sensation and strength were normal. Uvula tongue midline. Head turning and shoulder shrug  were normal and symmetric.With superior gaze held for 1 minute no ptosis or diplopia noted. Motor: The motor testing reveals 5 over 5 strength of all 4 extremities. Good symmetric motor tone is noted throughout. With arms abducted for 1 minute no weakness noted. Sensory: Sensory testing is intact to soft touch on all 4 extremities. No evidence of extinction is noted.  Coordination: Cerebellar testing reveals good finger-nose-finger and heel-to-shin bilaterally.  Gait and station: Gait is slightly wide-based. Tandem gait not attended. Romberg is negative. Reflexes: Deep tendon reflexes are symmetric and normal bilaterally.   DIAGNOSTIC DATA (LABS, IMAGING, TESTING) - I reviewed patient records, labs, notes, testing and imaging myself where available.  Lab Results  Component Value Date   WBC 7.7 09/13/2016   HGB 13.5 04/22/2015   HCT 40.3 09/13/2016   MCV 90 09/13/2016   PLT 286 09/13/2016      Component Value Date/Time   NA 143 09/13/2016 1638   K 4.8 09/13/2016 1638   CL 106 09/13/2016 1638   CO2 27 09/13/2016 1638   GLUCOSE 179 (H) 09/13/2016 1638   GLUCOSE 67 04/22/2015 1305   BUN 16 09/13/2016 1638   CREATININE 0.99 09/13/2016 1638   CALCIUM 8.8 09/13/2016 1638   PROT 5.8 (L) 09/13/2016 1638    ALBUMIN 3.9 09/13/2016 1638   AST 8 09/13/2016 1638   ALT 12 09/13/2016 1638   ALKPHOS 82 09/13/2016 1638   BILITOT 0.7 09/13/2016 1638   GFRNONAA 77 09/13/2016 1638   GFRAA 89 09/13/2016 1638    Lab Results  Component Value Date   HGBA1C 7.3 (H) 04/22/2015   Lab Results  Component Value Date   FAOZHYQM57 846 05/30/2011   Lab Results  Component Value Date   TSH 0.458 05/20/2011      ASSESSMENT AND PLAN 71 y.o. year old male  has a past medical history of Anemia; Anxiety; Arthritis; Basal cell carcinoma; Carpal tunnel syndrome, bilateral; Chronic low back pain (12/07/2014); Colon cancer ([place]); Colon polyps; Diabetes ([place]); Diverticulosis; DJD (degenerative joint disease); GERD (gastroesophageal reflux disease); Hemorrhoids; Hyperlipidemia; Hypertension; Morbid obesity ([place]); Myasthenia gravis ([place]); Myasthenia gravis without exacerbation ([place]) (05/19/2013); and Vitamin B12 deficiency. here with:  1. Myasthenia gravis 2. Vitamin B12 deficiency  Overall the patient is doing well. We discussed possibly reducing CellCept however he states that he would like to wait until the next visit. He will continue on CellCept 500 mg in the morning and 1000 mg in the evening. He will continue on Mestinon. I will check blood work today including a vitamin B12 level. He is advised that if his symptoms worsen or he develops new symptoms he should let us know. He will follow-up in 6 months or sooner if needed.    Ward Givens, MSN, NP-C 12/20/2016, 1:19 PM [facility] [address] [place], [place] [address] [phone]

## 2016-12-21 ENCOUNTER — Telehealth: Payer: Self-pay | Admitting: *Deleted

## 2016-12-21 LAB — CBC WITH DIFFERENTIAL/PLATELET
BASOS: 0 %
Basophils Absolute: 0 10*3/uL (ref 0.0–0.2)
EOS (ABSOLUTE): 0.1 10*3/uL (ref 0.0–0.4)
EOS: 1 %
Hematocrit: 43.1 % (ref 37.5–51.0)
Hemoglobin: 14.2 g/dL (ref 13.0–17.7)
IMMATURE GRANS (ABS): 0 10*3/uL (ref 0.0–0.1)
IMMATURE GRANULOCYTES: 0 %
LYMPHS: 20 %
Lymphocytes Absolute: 1.6 10*3/uL (ref 0.7–3.1)
MCH: 29.6 pg (ref 26.6–33.0)
MCHC: 32.9 g/dL (ref 31.5–35.7)
MCV: 90 fL (ref 79–97)
Monocytes Absolute: 0.7 10*3/uL (ref 0.1–0.9)
Monocytes: 8 %
NEUTROS PCT: 71 %
Neutrophils Absolute: 5.8 10*3/uL (ref 1.4–7.0)
PLATELETS: 276 10*3/uL (ref 150–379)
RBC: 4.79 x10E6/uL (ref 4.14–5.80)
RDW: 13.4 % (ref 12.3–15.4)
WBC: 8.1 10*3/uL (ref 3.4–10.8)

## 2016-12-21 LAB — COMPREHENSIVE METABOLIC PANEL
ALK PHOS: 91 IU/L (ref 39–117)
ALT: 10 IU/L (ref 0–44)
AST: 8 IU/L (ref 0–40)
Albumin/Globulin Ratio: 2 (ref 1.2–2.2)
Albumin: 4.1 g/dL (ref 3.6–4.8)
BUN/Creatinine Ratio: 14 (ref 10–24)
BUN: 14 mg/dL (ref 8–27)
Bilirubin Total: 0.7 mg/dL (ref 0.0–1.2)
CO2: 25 mmol/L (ref 18–29)
CREATININE: 1 mg/dL (ref 0.76–1.27)
Calcium: 8.8 mg/dL (ref 8.6–10.2)
Chloride: 102 mmol/L (ref 96–106)
GFR calc Af Amer: 88 mL/min/{1.73_m2} (ref >59)
GFR, EST NON AFRICAN AMERICAN: 76 mL/min/{1.73_m2} (ref >59)
GLOBULIN, TOTAL: 2.1 g/dL (ref 1.5–4.5)
GLUCOSE: 196 mg/dL — AB (ref 65–99)
Potassium: 4.7 mmol/L (ref 3.5–5.2)
Sodium: 142 mmol/L (ref 134–144)
Total Protein: 6.2 g/dL (ref 6.0–8.5)

## 2016-12-21 LAB — VITAMIN B12: Vitamin B-12: >2000 pg/mL — ABNORMAL HIGH (ref 232–1245)

## 2016-12-21 NOTE — Telephone Encounter (Signed)
Called [facility]. Spoke with Verdis Frederickson. Requested to completed PA over the phone since office closes at 12pm and closed over the weekend. She transferred me to staff to take clinical information. Spoke with Robin. She took clinicals. CCEQ#[id number]. Should receive answer by Monday. They will fax determination to [phone]. They will notify pharmacy and patient of decision.

## 2016-12-25 ENCOUNTER — Telehealth: Payer: Self-pay | Admitting: *Deleted

## 2016-12-25 NOTE — Telephone Encounter (Signed)
Per Edman Circle, NP spoke with patient and informed him his lab work is unremarkable. He verbalized understanding, appreciation.

## 2016-12-25 NOTE — Telephone Encounter (Signed)
Received fax notification from [facility] that mycophenolate 500mg  tablet approved. Effective 12/21/16-12/20/16.   Called [facility]. Spoke with [employer] . Advised them PA approved. They verbalized understanding and stated they were able to process. Pt already picked up rx.

## 2017-01-16 ENCOUNTER — Ambulatory Visit (INDEPENDENT_AMBULATORY_CARE_PROVIDER_SITE_OTHER): Payer: PPO | Admitting: *Deleted

## 2017-01-16 ENCOUNTER — Ambulatory Visit: Payer: PPO

## 2017-01-16 DIAGNOSIS — E538 Deficiency of other specified B group vitamins: Secondary | ICD-10-CM | POA: Diagnosis not present

## 2017-02-01 DIAGNOSIS — N182 Chronic kidney disease, stage 2 (mild): Secondary | ICD-10-CM | POA: Diagnosis not present

## 2017-02-01 DIAGNOSIS — E1159 Type 2 diabetes mellitus with other circulatory complications: Secondary | ICD-10-CM | POA: Diagnosis not present

## 2017-02-01 DIAGNOSIS — E784 Other hyperlipidemia: Secondary | ICD-10-CM | POA: Diagnosis not present

## 2017-02-01 DIAGNOSIS — Z125 Encounter for screening for malignant neoplasm of prostate: Secondary | ICD-10-CM | POA: Diagnosis not present

## 2017-02-11 DIAGNOSIS — Z6838 Body mass index (BMI) 38.0-38.9, adult: Secondary | ICD-10-CM | POA: Diagnosis not present

## 2017-02-11 DIAGNOSIS — M545 Low back pain: Secondary | ICD-10-CM | POA: Diagnosis not present

## 2017-02-11 DIAGNOSIS — F17200 Nicotine dependence, unspecified, uncomplicated: Secondary | ICD-10-CM | POA: Diagnosis not present

## 2017-02-11 DIAGNOSIS — G7000 Myasthenia gravis without (acute) exacerbation: Secondary | ICD-10-CM | POA: Diagnosis not present

## 2017-02-11 DIAGNOSIS — E784 Other hyperlipidemia: Secondary | ICD-10-CM | POA: Diagnosis not present

## 2017-02-11 DIAGNOSIS — I7389 Other specified peripheral vascular diseases: Secondary | ICD-10-CM | POA: Diagnosis not present

## 2017-02-11 DIAGNOSIS — E1159 Type 2 diabetes mellitus with other circulatory complications: Secondary | ICD-10-CM | POA: Diagnosis not present

## 2017-02-11 DIAGNOSIS — Z1389 Encounter for screening for other disorder: Secondary | ICD-10-CM | POA: Diagnosis not present

## 2017-02-11 DIAGNOSIS — I10 Essential (primary) hypertension: Secondary | ICD-10-CM | POA: Diagnosis not present

## 2017-02-11 DIAGNOSIS — Z0000 Encounter for general adult medical examination without abnormal findings: Secondary | ICD-10-CM | POA: Diagnosis not present

## 2017-02-15 DIAGNOSIS — Z1212 Encounter for screening for malignant neoplasm of rectum: Secondary | ICD-10-CM | POA: Diagnosis not present

## 2017-02-18 ENCOUNTER — Ambulatory Visit (INDEPENDENT_AMBULATORY_CARE_PROVIDER_SITE_OTHER): Payer: PPO | Admitting: *Deleted

## 2017-02-18 DIAGNOSIS — E538 Deficiency of other specified B group vitamins: Secondary | ICD-10-CM

## 2017-02-18 NOTE — Progress Notes (Signed)
Gave Vitamin B12 1000 mcg/mL IM in right deltoid. Cleaned with alcohol wipe prior to injection. Band-aid applied. Pt tolerated well.   

## 2017-03-18 ENCOUNTER — Ambulatory Visit (INDEPENDENT_AMBULATORY_CARE_PROVIDER_SITE_OTHER): Payer: PPO | Admitting: *Deleted

## 2017-03-18 DIAGNOSIS — E538 Deficiency of other specified B group vitamins: Secondary | ICD-10-CM | POA: Diagnosis not present

## 2017-04-18 ENCOUNTER — Telehealth: Payer: Self-pay | Admitting: Neurology

## 2017-04-18 ENCOUNTER — Ambulatory Visit: Payer: PPO

## 2017-04-18 NOTE — Telephone Encounter (Signed)
Called and LVM for pt advising we can r/s to Monday. Asked him to call back to schedule a time. Gave [facility] phone number.

## 2017-04-18 NOTE — Telephone Encounter (Signed)
Pt called wanting to reschedule his B12 injection, he would like to have it rescheduled for Monday, please call

## 2017-04-19 NOTE — Telephone Encounter (Signed)
Called pt again. Advised he can come Monday for B12 injection. Asked him to call back if he cannot come Monday. Advised him to avoid lunch hours between 1130-130pm

## 2017-04-22 ENCOUNTER — Ambulatory Visit (INDEPENDENT_AMBULATORY_CARE_PROVIDER_SITE_OTHER): Payer: PPO | Admitting: *Deleted

## 2017-04-22 DIAGNOSIS — E538 Deficiency of other specified B group vitamins: Secondary | ICD-10-CM

## 2017-04-22 NOTE — Progress Notes (Signed)
Gave Vitamin B12 1000 mcg/mL IM in right deltoid. Cleaned with alcohol wipe prior to injection. Band-aid applied. Pt tolerated well.   

## 2017-04-23 DIAGNOSIS — E113393 Type 2 diabetes mellitus with moderate nonproliferative diabetic retinopathy without macular edema, bilateral: Secondary | ICD-10-CM | POA: Diagnosis not present

## 2017-04-23 DIAGNOSIS — H35033 Hypertensive retinopathy, bilateral: Secondary | ICD-10-CM | POA: Diagnosis not present

## 2017-04-23 DIAGNOSIS — H35373 Puckering of macula, bilateral: Secondary | ICD-10-CM | POA: Diagnosis not present

## 2017-04-23 DIAGNOSIS — H2513 Age-related nuclear cataract, bilateral: Secondary | ICD-10-CM | POA: Diagnosis not present

## 2017-05-01 ENCOUNTER — Encounter (INDEPENDENT_AMBULATORY_CARE_PROVIDER_SITE_OTHER): Payer: Self-pay

## 2017-05-01 ENCOUNTER — Ambulatory Visit (INDEPENDENT_AMBULATORY_CARE_PROVIDER_SITE_OTHER): Payer: PPO | Admitting: Internal Medicine

## 2017-05-01 ENCOUNTER — Encounter: Payer: Self-pay | Admitting: Internal Medicine

## 2017-05-01 VITALS — BP 122/70 | HR 80 | Ht 68.0 in | Wt 259.0 lb

## 2017-05-01 DIAGNOSIS — K219 Gastro-esophageal reflux disease without esophagitis: Secondary | ICD-10-CM

## 2017-05-01 DIAGNOSIS — Z794 Long term (current) use of insulin: Secondary | ICD-10-CM

## 2017-05-01 DIAGNOSIS — E118 Type 2 diabetes mellitus with unspecified complications: Secondary | ICD-10-CM | POA: Diagnosis not present

## 2017-05-01 DIAGNOSIS — IMO0001 Reserved for inherently not codable concepts without codable children: Secondary | ICD-10-CM

## 2017-05-01 DIAGNOSIS — R195 Other fecal abnormalities: Secondary | ICD-10-CM

## 2017-05-01 DIAGNOSIS — Z85038 Personal history of other malignant neoplasm of large intestine: Secondary | ICD-10-CM | POA: Diagnosis not present

## 2017-05-01 NOTE — Patient Instructions (Signed)
We will contact you when the November schedule comes out so that we can schedule your colonoscopy at a time and date that is convienent for you

## 2017-05-01 NOTE — Progress Notes (Signed)
HISTORY OF PRESENT ILLNESS:  Joshua [name] is a 71 y.o. male with multiple significant medical problems including hypertension, hyperlipidemia, insulin requiring diabetes mellitus, myasthenia gravis, morbid obesity, early-stage colon cancer, and adenomatous colon polyps. Patient is sent today by his primary care provider Dr. Philip Aspen regarding Hemoccult-positive stool identified on annual testing. Outside records and laboratories have been personally reviewed. Hemoccult testing from June 2018 returned positive. Other laboratories at that time were unremarkable. Namely, normal hemoglobin at 14.0. The patient was last seen in this office April 2014 regarding Hemoccult-positive stool. See that dictation for details. He underwent colonoscopy that month. He was found to have prior ileocolonic resection and moderate diverticulosis. No other abnormalities. Routine follow-up in 5 years recommended. In 2002 he had stage I colon cancer leading to the resection. GI review of systems is remarkable for GERD. The patient takes Pepcid twice daily. For the most part this controls symptoms. No dysphagia. No weight loss. His myasthenia gravis is under good control. He does take oral agents in addition to his insulin  REVIEW OF SYSTEMS:  All non-GI ROS negative unless otherwise mentioned in the history of present illness except for sinus and allergy trouble, anxiety, arthritis, visual change, back pain, itching, sleeping problems  Past Medical History:  Diagnosis Date  . Anemia   . Anxiety   . Arthritis   . Basal cell carcinoma   . Carpal tunnel syndrome, bilateral   . Chronic low back pain 12/07/2014  . Colon cancer ([place])   . Colon polyps   . Diabetes ([place])   . Diverticulosis   . DJD (degenerative joint disease)   . GERD (gastroesophageal reflux disease)   . Hemorrhoids   . Hyperlipidemia   . Hypertension   . Morbid obesity ([place])   . Myasthenia gravis ([place])   . Myasthenia gravis without exacerbation ([place])  05/19/2013  . Vitamin B12 deficiency     Past Surgical History:  Procedure Laterality Date  . ANKLE ARTHROPLASTY Right 1969  . BASAL CELL CARCINOMA EXCISION     face  . CARPAL TUNNEL WITH CUBITAL TUNNEL Left 06/02/2013   Procedure: CARPAL TUNNEL WITH CUBITAL TUNNEL;  Surgeon: Tennis Must, MD;  Location: [facility];  Service: Orthopedics;  Laterality: Left;  . CARPAL TUNNEL WITH CUBITAL TUNNEL Right 07/14/2013   Procedure: RIGHT CARPAL TUNNEL AND CUBITAL TUNNEL RELEASE;  Surgeon: Tennis Must, MD;  Location: [facility];  Service: Orthopedics;  Laterality: Right;  . LUMBAR LAMINECTOMY/DECOMPRESSION MICRODISCECTOMY N/A 05/02/2015   Procedure: LUMBAR LAMINECTOMY/DECOMPRESSION MICRODISCECTOMY 2 LEVELS;  Surgeon: Jovita Gamma, MD;  Location: [facility];  Service: Neurosurgery;  Laterality: N/A;  L3 L4 L5 laminectomies  . PATELLA FRACTURE SURGERY Left 2011  . RIGHT COLECTOMY  2006  . TONSILLECTOMY  1968    Social History Joshua [name]  reports that he has been smoking Cigars.  He has never used smokeless tobacco. He reports that he drinks alcohol. He reports that he does not use drugs.  family history includes Colon polyps in his maternal grandfather; Heart disease in his mother; Prostate cancer in his maternal grandfather.  Allergies  Allergen Reactions  . Aluminum Chlorohydrate Rash  . Niacin And Related Rash       PHYSICAL EXAMINATION: Vital signs: BP 122/70   Pulse 80   Ht 5\' 8"  (1.727 m)   Wt 259 lb (117.5 kg)   BMI 39.38 kg/m   Constitutional: Obese, unhealthy appearing , no acute distress Psychiatric: alert and oriented  x3, cooperative Eyes: extraocular movements intact, anicteric, conjunctiva pink Mouth: oral pharynx moist, no lesions Neck: supple without thyromegaly Lymph: no lymphadenopathy Cardiovascular: heart regular rate and rhythm, no murmur Lungs: clear to auscultation bilaterally Abdomen: soft, obese, nontender, nondistended,  no obvious ascites, no peritoneal signs, normal bowel sounds, no organomegaly Rectal: Deferred until colonoscopy Extremities: no clubbing, cyanosis, or lower extremity edema bilaterally Skin: no lesions on visible extremities Neuro: No focal deficits. Cranial nerves intact  ASSESSMENT:  #1. Hemoccult-positive stool. #2. Stage I colon cancer 2002 status post right hemicolectomy #3. GERD without alarm features #4. Multiple significant medical problems including morbid obesity, myasthenia gravis, and insulin requiring diabetes mellitus   PLAN:  #1. Schedule colonoscopy to evaluate Hemoccult-positive stool and provide follow-up neoplasia surveillance. The patient is high-risk given his comorbidities and the need to address diabetic therapies.The nature of the procedure, as well as the risks, benefits, and alternatives were carefully and thoroughly reviewed with the patient. Ample time for discussion and questions allowed. The patient understood, was satisfied, and agreed to proceed. #2. Upper endoscopy to evaluate chronic GERD and recurrent Hemoccult-positive stool. Patient is high-risk as above.The nature of the procedure, as well as the risks, benefits, and alternatives were carefully and thoroughly reviewed with the patient. Ample time for discussion and questions allowed. The patient understood, was satisfied, and agreed to proceed. #3. Hold morning insulin and oral agents that his procedure to avoid and wanted hypoglycemia #4. Continue twice a day H2 receptor antagonist therapy #5. Reflux precautions #6. Ongoing general medical follow-up with Dr. Philip Aspen

## 2017-05-14 ENCOUNTER — Telehealth: Payer: Self-pay | Admitting: Internal Medicine

## 2017-05-14 NOTE — Telephone Encounter (Signed)
Spoke with patient who states he will call back by end of the week to schedule endo/colon for morning appt in November with Dr.Perry. Please see ov notes for 8.22.18 on scheduling status and contact Magda Paganini for diabetic medication info.

## 2017-05-20 ENCOUNTER — Other Ambulatory Visit: Payer: Self-pay | Admitting: Neurology

## 2017-05-22 ENCOUNTER — Encounter: Payer: Self-pay | Admitting: Internal Medicine

## 2017-05-23 ENCOUNTER — Ambulatory Visit (INDEPENDENT_AMBULATORY_CARE_PROVIDER_SITE_OTHER): Payer: PPO | Admitting: *Deleted

## 2017-05-23 DIAGNOSIS — E538 Deficiency of other specified B group vitamins: Secondary | ICD-10-CM

## 2017-05-23 NOTE — Progress Notes (Signed)
Gave Vitamin B12 1069mcg/mL IM in left deltoid. Cleaned with alcohol wipe prior to injection. Band-aid applied. Pt tolerated well.

## 2017-05-24 DIAGNOSIS — E784 Other hyperlipidemia: Secondary | ICD-10-CM | POA: Diagnosis not present

## 2017-05-24 DIAGNOSIS — Z794 Long term (current) use of insulin: Secondary | ICD-10-CM | POA: Diagnosis not present

## 2017-05-24 DIAGNOSIS — I10 Essential (primary) hypertension: Secondary | ICD-10-CM | POA: Diagnosis not present

## 2017-05-24 DIAGNOSIS — M545 Low back pain: Secondary | ICD-10-CM | POA: Diagnosis not present

## 2017-05-24 DIAGNOSIS — E1159 Type 2 diabetes mellitus with other circulatory complications: Secondary | ICD-10-CM | POA: Diagnosis not present

## 2017-05-24 DIAGNOSIS — Z6837 Body mass index (BMI) 37.0-37.9, adult: Secondary | ICD-10-CM | POA: Diagnosis not present

## 2017-05-24 DIAGNOSIS — I7389 Other specified peripheral vascular diseases: Secondary | ICD-10-CM | POA: Diagnosis not present

## 2017-05-24 DIAGNOSIS — G7000 Myasthenia gravis without (acute) exacerbation: Secondary | ICD-10-CM | POA: Diagnosis not present

## 2017-06-24 ENCOUNTER — Ambulatory Visit: Payer: PPO

## 2017-06-26 ENCOUNTER — Other Ambulatory Visit: Payer: Self-pay | Admitting: *Deleted

## 2017-06-26 ENCOUNTER — Ambulatory Visit (INDEPENDENT_AMBULATORY_CARE_PROVIDER_SITE_OTHER): Payer: Self-pay

## 2017-06-26 ENCOUNTER — Ambulatory Visit (INDEPENDENT_AMBULATORY_CARE_PROVIDER_SITE_OTHER): Payer: PPO | Admitting: Adult Health

## 2017-06-26 ENCOUNTER — Encounter: Payer: Self-pay | Admitting: Adult Health

## 2017-06-26 VITALS — BP 119/66 | HR 74 | Ht 68.0 in | Wt 258.2 lb

## 2017-06-26 DIAGNOSIS — E538 Deficiency of other specified B group vitamins: Secondary | ICD-10-CM | POA: Diagnosis not present

## 2017-06-26 DIAGNOSIS — Z0289 Encounter for other administrative examinations: Secondary | ICD-10-CM

## 2017-06-26 DIAGNOSIS — G7000 Myasthenia gravis without (acute) exacerbation: Secondary | ICD-10-CM | POA: Diagnosis not present

## 2017-06-26 NOTE — Progress Notes (Signed)
I have read the note, and I agree with the clinical assessment and plan.  Onetta Spainhower KEITH   

## 2017-06-26 NOTE — Patient Instructions (Signed)
Your Plan:  Continue Cellcept and Mestinon If your symptoms worsen or you develop new symptoms please let us know.   Thank you for coming to see [place] at [facility]. I hope we have been able to provide you high quality care today.  You may receive a patient satisfaction survey over the next few weeks. We would appreciate your feedback and comments so that we may continue to improve ourselves and the health of our patients.

## 2017-06-26 NOTE — Progress Notes (Signed)
PATIENT: Joshua [name] DOB: Dec 27, 1945  REASON FOR VISIT: follow up- myasthenia gravis  HISTORY FROM: patient  HISTORY OF PRESENT ILLNESS: Today 06/26/17 Joshua [name] is a 71 year old male with a history of myasthenia gravis. He returns today for follow-up. He remains on CellCept 500 mg in morning and 1000 mg in the evening. He reports that he is trying to decrease to 500 mg twice a day but states that he "felt weird" so he resumed taking 500 mg in the morning and 1000 mg at bedtime. He remains on Mestinon 30 mg twice a day. He states on occasion he does miss a dose however he does not have a recurrence of symptoms. He denies ptosis and diplopia. Denies any weakness in the extremities. States that he does feel fatigued. Denies any difficulty breathing or chewing his food. He continues to get monthly B12 injections. He returns today for an evaluation.  HISTORY 12/20/16:  Joshua [name] is a 71 year old male with a history of myasthenia gravis. He returns today for follow-up. At the last visit CellCept was decreased to 500 mg in the morning and 1000 mg in the evening. He is also on Mestinon 60 mg half a tablet twice a day. He reports that he is tolerating the medication well. He denies any symptoms of myasthenia. Denies double vision or ptosis. Denies weakness in the limbs. He does report occasional drooling. Denies any trouble swallowing or chewing. Denies any trouble breathing. Overall he is remained stable. He has been getting vitamin B12 injections for low levels. He returns today for an evaluation.   REVIEW OF SYSTEMS: Out of a complete 14 system review of symptoms, the patient complains only of the following symptoms, and all other reviewed systems are negative.  See history of present illness  ALLERGIES: Allergies  Allergen Reactions  . Aluminum Chlorohydrate Rash  . Niacin And Related Rash    HOME MEDICATIONS: Outpatient Medications Prior to Visit  Medication Sig Dispense Refill  .  aspirin EC 81 MG tablet Take 81 mg by mouth daily.    Marland Kitchen atorvastatin (LIPITOR) 40 MG tablet Take 40 mg by mouth daily.    . B-D ULTRAFINE III SHORT PEN 31G X 8 MM MISC 200 each by Other route 3 (three) times daily between meals.    . busPIRone (BUSPAR) 7.5 MG tablet TAKE 1 TABLET BY MOUTH TWICE DAILY 180 tablet 3  . calcium-vitamin D (OSCAL WITH D) 500-200 MG-UNIT tablet Take 1 tablet by mouth daily with breakfast.    . cetirizine-pseudoephedrine (ZYRTEC-D) 5-120 MG tablet Take 1 tablet by mouth daily as needed for allergies.    . famotidine (PEPCID) 20 MG tablet Take 20 mg by mouth 2 (two) times daily.    . insulin aspart protamine-insulin aspart (NOVOLOG 70/30) (70-30) 100 UNIT/ML injection Inject 50 Units into the skin 2 (two) times daily with a meal. 50 units with breakfast and before bed.    . INVOKANA 300 MG TABS Take 1 tablet by mouth daily.     Marland Kitchen lisinopril (PRINIVIL,ZESTRIL) 5 MG tablet Take 5 mg by mouth daily.    . metFORMIN (GLUCOPHAGE) 1000 MG tablet Take 1,000 mg by mouth at bedtime.     . Multiple Vitamins-Minerals (OCUVITE PO) Take 1 tablet by mouth daily.    . mycophenolate (CELLCEPT) 500 MG tablet 500mg  (1 tablet) in the morning and 1000mg  (2 tablets) in the evening 270 tablet 0  . ONE TOUCH ULTRA TEST test strip 200 each by Other route 3 (  three) times daily.    Glory Rosebush DELICA LANCETS 69S MISC 100 each by Other route 3 (three) times daily.    Marland Kitchen pyridostigmine (MESTINON) 60 MG tablet TAKE 0.5 TABLETS (30 MG TOTAL) BY MOUTH 3 (THREE) TIMES DAILY. (Patient taking differently: Take 30 mg by mouth 2 (two) times daily. TAKE 0.5 TABLETS (30 MG TOTAL) BY MOUTH 3 (THREE) TIMES DAILY.) 135 tablet 3  . traMADol (ULTRAM) 50 MG tablet Take 100 mg by mouth 3 (three) times daily.      Facility-Administered Medications Prior to Visit  Medication Dose Route Frequency Provider Last Rate Last Dose  . cyanocobalamin ((VITAMIN B-12)) injection 1,000 mcg  1,000 mcg Intramuscular Q30 days Kathrynn Ducking, MD   1,000 mcg at 05/23/17 1634    PAST MEDICAL HISTORY: Past Medical History:  Diagnosis Date  . Anemia   . Anxiety   . Arthritis   . Basal cell carcinoma   . Carpal tunnel syndrome, bilateral   . Chronic low back pain 12/07/2014  . Colon cancer ([place])   . Colon polyps   . Diabetes ([place])   . Diverticulosis   . DJD (degenerative joint disease)   . GERD (gastroesophageal reflux disease)   . Hemorrhoids   . Hyperlipidemia   . Hypertension   . Morbid obesity ([place])   . Myasthenia gravis ([place])   . Myasthenia gravis without exacerbation ([place]) 05/19/2013  . Vitamin B12 deficiency     PAST SURGICAL HISTORY: Past Surgical History:  Procedure Laterality Date  . ANKLE ARTHROPLASTY Right 1969  . BASAL CELL CARCINOMA EXCISION     face  . CARPAL TUNNEL WITH CUBITAL TUNNEL Left 06/02/2013   Procedure: CARPAL TUNNEL WITH CUBITAL TUNNEL;  Surgeon: Tennis Must, MD;  Location: [facility];  Service: Orthopedics;  Laterality: Left;  . CARPAL TUNNEL WITH CUBITAL TUNNEL Right 07/14/2013   Procedure: RIGHT CARPAL TUNNEL AND CUBITAL TUNNEL RELEASE;  Surgeon: Tennis Must, MD;  Location: [facility];  Service: Orthopedics;  Laterality: Right;  . LUMBAR LAMINECTOMY/DECOMPRESSION MICRODISCECTOMY N/A 05/02/2015   Procedure: LUMBAR LAMINECTOMY/DECOMPRESSION MICRODISCECTOMY 2 LEVELS;  Surgeon: Jovita Gamma, MD;  Location: [facility];  Service: Neurosurgery;  Laterality: N/A;  L3 L4 L5 laminectomies  . PATELLA FRACTURE SURGERY Left 2011  . RIGHT COLECTOMY  2006  . TONSILLECTOMY  1968    FAMILY HISTORY: Family History  Problem Relation Age of Onset  . Heart disease Mother   . Prostate cancer Maternal Grandfather   . Colon polyps Maternal Grandfather   . Colon cancer Neg Hx     SOCIAL HISTORY: Social History   Social History  . Marital status: Married    Spouse name: N/A  . Number of children: 1  . Years of education: college   Occupational History   . Retired Astronomer [employer]   Social History Main Topics  . Smoking status: Current Every Day Smoker    Types: Cigars  . Smokeless tobacco: Never Used  . Alcohol use Yes     Comment: 6 pack a year  . Drug use: No  . Sexual activity: Not on file   Other Topics Concern  . Not on file   Social History Narrative   Lives at home w/ his wife, who has Alzheimer's   Patient is right handed.   Patient drinks 2 cups of caffeine daily.      PHYSICAL EXAM  Vitals:   06/26/17 1455  BP: 119/66  Pulse: 74  Weight:  258 lb 3.2 oz (117.1 kg)  Height: 5\' 8"  (1.727 m)   Body mass index is 39.26 kg/m.  Generalized: Well developed, in no acute distress   Neurological examination  Mentation: Alert oriented to time, place, history taking. Follows all commands speech and language fluent Cranial nerve II-XII: Pupils were equal round reactive to light. Extraocular movements were full, visual field were full on confrontational test. Facial sensation and strength were normal. Uvula tongue midline. Head turning and shoulder shrug  were normal and symmetric. Superior gaze for 1 minute no ptosis noted. Patient reports mild diplopia. Motor: The motor testing reveals 5 over 5 strength of all 4 extremities. Good symmetric motor tone is noted throughout. With arms abducted for 1 minute no weakness noted. Sensory: Sensory testing is intact to soft touch on all 4 extremities. No evidence of extinction is noted.  Coordination: Cerebellar testing reveals good finger-nose-finger and heel-to-shin bilaterally.  Gait and station: Gait is normal.  Reflexes: Deep tendon reflexes are symmetric and normal bilaterally.   DIAGNOSTIC DATA (LABS, IMAGING, TESTING) - I reviewed patient records, labs, notes, testing and imaging myself where available.  Lab Results  Component Value Date   WBC 8.1 12/20/2016   HGB 14.2 12/20/2016   HCT 43.1 12/20/2016   MCV 90 12/20/2016   PLT 276 12/20/2016      Component Value  Date/Time   NA 142 12/20/2016 1612   K 4.7 12/20/2016 1612   CL 102 12/20/2016 1612   CO2 25 12/20/2016 1612   GLUCOSE 196 (H) 12/20/2016 1612   GLUCOSE 67 04/22/2015 1305   BUN 14 12/20/2016 1612   CREATININE 1.00 12/20/2016 1612   CALCIUM 8.8 12/20/2016 1612   PROT 6.2 12/20/2016 1612   ALBUMIN 4.1 12/20/2016 1612   AST 8 12/20/2016 1612   ALT 10 12/20/2016 1612   ALKPHOS 91 12/20/2016 1612   BILITOT 0.7 12/20/2016 1612   GFRNONAA 76 12/20/2016 1612   GFRAA 88 12/20/2016 1612   No results found for: CHOL, HDL, LDLCALC, LDLDIRECT, TRIG, CHOLHDL Lab Results  Component Value Date   HGBA1C 7.3 (H) 04/22/2015   Lab Results  Component Value Date   VITAMINB12 >2000 (H) 12/20/2016   Lab Results  Component Value Date   TSH 0.458 05/20/2011      ASSESSMENT AND PLAN 71 y.o. year old male  has a past medical history of Anemia; Anxiety; Arthritis; Basal cell carcinoma; Carpal tunnel syndrome, bilateral; Chronic low back pain (12/07/2014); Colon cancer ([place]); Colon polyps; Diabetes ([place]); Diverticulosis; DJD (degenerative joint disease); GERD (gastroesophageal reflux disease); Hemorrhoids; Hyperlipidemia; Hypertension; Morbid obesity ([place]); Myasthenia gravis ([place]); Myasthenia gravis without exacerbation ([place]) (05/19/2013); and Vitamin B12 deficiency. here with:  1. Myasthenia gravis 2. B12 deficiency  Patient will continue on CellCept and Mestinon. I advised that if he wants to attempt decreasing the CellCept again to 500 twice a day he can. However if he has recurrent symptoms he will need to increase his back to 500 in the morning and 1000 mg in the evening. He voiced understanding. He will receive his monthly B12 injection today. He is advised that if his symptoms worsen or he develops new symptoms he should let us know.     Ward Givens, MSN, NP-C 06/26/2017, 3:09 PM [facility] [address], [place], [place] [address] [phone]

## 2017-07-05 DIAGNOSIS — Z23 Encounter for immunization: Secondary | ICD-10-CM | POA: Diagnosis not present

## 2017-07-22 ENCOUNTER — Other Ambulatory Visit: Payer: Self-pay

## 2017-07-22 ENCOUNTER — Ambulatory Visit (AMBULATORY_SURGERY_CENTER): Payer: Self-pay | Admitting: *Deleted

## 2017-07-22 VITALS — Ht 68.0 in | Wt 254.0 lb

## 2017-07-22 DIAGNOSIS — K219 Gastro-esophageal reflux disease without esophagitis: Secondary | ICD-10-CM

## 2017-07-22 DIAGNOSIS — Z85038 Personal history of other malignant neoplasm of large intestine: Secondary | ICD-10-CM

## 2017-07-22 DIAGNOSIS — R195 Other fecal abnormalities: Secondary | ICD-10-CM

## 2017-07-22 MED ORDER — NA SULFATE-K SULFATE-MG SULF 17.5-3.13-1.6 GM/177ML PO SOLN: ORAL | 0 refills | Status: DC

## 2017-07-22 NOTE — Progress Notes (Signed)
Patient denies any allergies to eggs or soy. Patient denies any problems with anesthesia/sedation. Patient denies any oxygen use at home. Patient denies taking any diet/weight loss medications or blood thinners. EMMI education assisgned to patient on EGD,colonoscopy, this was explained and instructions given to patient. suprep sample given to patient.

## 2017-07-29 ENCOUNTER — Ambulatory Visit: Payer: PPO

## 2017-07-30 ENCOUNTER — Ambulatory Visit (INDEPENDENT_AMBULATORY_CARE_PROVIDER_SITE_OTHER): Payer: PPO | Admitting: *Deleted

## 2017-07-30 DIAGNOSIS — E538 Deficiency of other specified B group vitamins: Secondary | ICD-10-CM

## 2017-07-30 MED ORDER — CYANOCOBALAMIN 1000 MCG/ML IJ SOLN
1000.0000 ug | Freq: Once | INTRAMUSCULAR | Status: AC
  Administered 2017-07-30: 1000 ug via INTRAMUSCULAR

## 2017-07-30 NOTE — Progress Notes (Signed)
GaveVitamin B12 1000mcgIM in right deltoid. Cleaned with alcohol wipe prior to injection. Band-aid applied. Pt tolerated well.  

## 2017-08-04 ENCOUNTER — Other Ambulatory Visit: Payer: Self-pay | Admitting: Neurology

## 2017-08-05 ENCOUNTER — Encounter: Payer: PPO | Admitting: Internal Medicine

## 2017-08-12 DIAGNOSIS — D2262 Melanocytic nevi of left upper limb, including shoulder: Secondary | ICD-10-CM | POA: Diagnosis not present

## 2017-08-12 DIAGNOSIS — Z85828 Personal history of other malignant neoplasm of skin: Secondary | ICD-10-CM | POA: Diagnosis not present

## 2017-08-12 DIAGNOSIS — D225 Melanocytic nevi of trunk: Secondary | ICD-10-CM | POA: Diagnosis not present

## 2017-08-12 DIAGNOSIS — D2261 Melanocytic nevi of right upper limb, including shoulder: Secondary | ICD-10-CM | POA: Diagnosis not present

## 2017-08-12 DIAGNOSIS — D2272 Melanocytic nevi of left lower limb, including hip: Secondary | ICD-10-CM | POA: Diagnosis not present

## 2017-08-12 DIAGNOSIS — L821 Other seborrheic keratosis: Secondary | ICD-10-CM | POA: Diagnosis not present

## 2017-08-12 DIAGNOSIS — D2271 Melanocytic nevi of right lower limb, including hip: Secondary | ICD-10-CM | POA: Diagnosis not present

## 2017-08-12 DIAGNOSIS — D1801 Hemangioma of skin and subcutaneous tissue: Secondary | ICD-10-CM | POA: Diagnosis not present

## 2017-08-19 ENCOUNTER — Other Ambulatory Visit: Payer: Self-pay | Admitting: Adult Health

## 2017-08-19 ENCOUNTER — Encounter: Payer: Self-pay | Admitting: Internal Medicine

## 2017-08-27 ENCOUNTER — Ambulatory Visit (INDEPENDENT_AMBULATORY_CARE_PROVIDER_SITE_OTHER): Payer: PPO

## 2017-08-27 DIAGNOSIS — E538 Deficiency of other specified B group vitamins: Secondary | ICD-10-CM | POA: Diagnosis not present

## 2017-08-27 MED ORDER — CYANOCOBALAMIN 1000 MCG/ML IJ SOLN
1000.0000 ug | Freq: Once | INTRAMUSCULAR | Status: AC
  Administered 2017-08-27: 1000 ug via INTRAMUSCULAR

## 2017-08-28 ENCOUNTER — Encounter: Payer: Self-pay | Admitting: Internal Medicine

## 2017-08-28 ENCOUNTER — Ambulatory Visit (AMBULATORY_SURGERY_CENTER): Payer: PPO | Admitting: Internal Medicine

## 2017-08-28 ENCOUNTER — Other Ambulatory Visit: Payer: Self-pay

## 2017-08-28 VITALS — BP 119/68 | HR 77 | Temp 98.0°F | Resp 11 | Ht 68.0 in | Wt 254.0 lb

## 2017-08-28 DIAGNOSIS — I10 Essential (primary) hypertension: Secondary | ICD-10-CM | POA: Diagnosis not present

## 2017-08-28 DIAGNOSIS — E119 Type 2 diabetes mellitus without complications: Secondary | ICD-10-CM | POA: Diagnosis not present

## 2017-08-28 DIAGNOSIS — K219 Gastro-esophageal reflux disease without esophagitis: Secondary | ICD-10-CM

## 2017-08-28 DIAGNOSIS — E669 Obesity, unspecified: Secondary | ICD-10-CM | POA: Diagnosis not present

## 2017-08-28 DIAGNOSIS — R195 Other fecal abnormalities: Secondary | ICD-10-CM

## 2017-08-28 DIAGNOSIS — D128 Benign neoplasm of rectum: Secondary | ICD-10-CM

## 2017-08-28 DIAGNOSIS — Z85038 Personal history of other malignant neoplasm of large intestine: Secondary | ICD-10-CM

## 2017-08-28 DIAGNOSIS — K621 Rectal polyp: Secondary | ICD-10-CM | POA: Diagnosis not present

## 2017-08-28 DIAGNOSIS — D129 Benign neoplasm of anus and anal canal: Secondary | ICD-10-CM

## 2017-08-28 MED ORDER — SODIUM CHLORIDE 0.9 % IV SOLN: 500.0000 mL | INTRAVENOUS | Status: DC

## 2017-08-28 NOTE — Patient Instructions (Signed)
YOU HAD AN ENDOSCOPIC PROCEDURE TODAY AT [facility]:   Refer to the procedure report that was given to you for any specific questions about what was found during the examination.  If the procedure report does not answer your questions, please call your gastroenterologist to clarify.  If you requested that your care partner not be given the details of your procedure findings, then the procedure report has been included in a sealed envelope for you to review at your convenience later.  YOU SHOULD EXPECT: Some feelings of bloating in the abdomen. Passage of more gas than usual.  Walking can help get rid of the air that was put into your GI tract during the procedure and reduce the bloating. If you had a lower endoscopy (such as a colonoscopy or flexible sigmoidoscopy) you may notice spotting of blood in your stool or on the toilet paper. If you underwent a bowel prep for your procedure, you may not have a normal bowel movement for a few days.  Please Note:  You might notice some irritation and congestion in your nose or some drainage.  This is from the oxygen used during your procedure.  There is no need for concern and it should clear up in a day or so.  SYMPTOMS TO REPORT IMMEDIATELY:   Following lower endoscopy (colonoscopy or flexible sigmoidoscopy):  Excessive amounts of blood in the stool  Significant tenderness or worsening of abdominal pains  Swelling of the abdomen that is new, acute  Fever of 100F or higher   Following upper endoscopy (EGD)  Vomiting of blood or coffee ground material  New chest pain or pain under the shoulder blades  Painful or persistently difficult swallowing  New shortness of breath  Fever of 100F or higher  Black, tarry-looking stools  For urgent or emergent issues, a gastroenterologist can be reached at any hour by calling [phone].   DIET:  We do recommend a small meal at first, but then you may proceed to your regular diet.  Drink  plenty of fluids but you should avoid alcoholic beverages for 24 hours.  ACTIVITY:  You should plan to take it easy for the rest of today and you should NOT DRIVE or use heavy machinery until tomorrow (because of the sedation medicines used during the test).    FOLLOW UP: Our staff will call the number listed on your records the next business day following your procedure to check on you and address any questions or concerns that you may have regarding the information given to you following your procedure. If we do not reach you, we will leave a message.  However, if you are feeling well and you are not experiencing any problems, there is no need to return our call.  We will assume that you have returned to your regular daily activities without incident.  If any biopsies were taken you will be contacted by phone or by letter within the next 1-3 weeks.  Please call us at [phone] if you have not heard about the biopsies in 3 weeks.   Diverticulosis (handout given) Polyps (handout given) High Fiber Diet (handout given) Hemorrhoids (handout given) Repeat Colonoscopy screening in 5 years If ranitidine does not control her reflux symptoms, recommend omeprazole 20 mg daily. May be obtained over the counter  SIGNATURES/CONFIDENTIALITY: You and/or your care partner have signed paperwork which will be entered into your electronic medical record.  These signatures attest to the fact that that the information above  on your After Visit Summary has been reviewed and is understood.  Full responsibility of the confidentiality of this discharge information lies with you and/or your care-partner.

## 2017-08-28 NOTE — Op Note (Signed)
[facility] Patient Name: Joshua [name] Procedure Date: 08/28/2017 3:38 PM MRN: [id number] Endoscopist: Docia Chuck. Henrene Pastor , MD Age: 71 Referring MD:  Date of Birth: June 08, 1946 Gender: Male Account #: [id number] Procedure:                Colonoscopy, With cold snare polypectomy x 1 Indications:              Heme positive stool. 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 Surveillance examinations 2007,                            2010, 2014 Medicines:                Monitored Anesthesia Care Procedure:                Pre-Anesthesia Assessment:                           - Prior to the procedure, a History and Physical                            was performed, and patient medications and                            allergies were reviewed. The patient's tolerance of                            previous anesthesia was also reviewed. The risks                            and benefits of the procedure and the sedation                            options and risks were discussed with the patient.                            All questions were answered, and informed consent                            was obtained. Prior Anticoagulants: The patient has                            taken no previous anticoagulant or antiplatelet                            agents. ASA Grade Assessment: II - A patient with                            mild systemic disease. After reviewing the risks  and benefits, the patient was deemed in                            satisfactory condition to undergo the procedure.                           After obtaining informed consent, the colonoscope                            was passed under direct vision. Throughout the                            procedure, the patient's blood pressure, pulse, and                            oxygen saturations  were monitored continuously. The                            Colonoscope was introduced through the anus and                            advanced to the the ileocolonic anastomosis. The                            terminal ileum and the rectum were photographed.                            The quality of the bowel preparation was excellent.                            The colonoscopy was performed without difficulty.                            The patient tolerated the procedure well. The bowel                            preparation used was SUPREP. Scope In: 3:45:03 PM Scope Out: 4:00:12 PM Scope Withdrawal Time: 0 hours 11 minutes 45 seconds  Total Procedure Duration: 0 hours 15 minutes 9 seconds  Findings:                 A 3 mm polyp was found in the rectum. The polyp was                            removed with a cold snare. Resection and retrieval                            were complete.                           Multiple diverticula were found in the left colon.                           Internal hemorrhoids were found during retroflexion.  There was a side-end ileocolonic anastomosis which                            was widely patent. The neo-ileum was intubated and                            appeared normal.The exam was otherwise without                            abnormality on direct and retroflexion views. Complications:            No immediate complications. Estimated blood loss:                            None. Estimated Blood Loss:     Estimated blood loss: none. Impression:               - One 3 mm polyp in the rectum, removed with a cold                            snare. Resected and retrieved.                           - Diverticulosis in the left colon.                           - Internal hemorrhoids.                           - Status post right hemicolectomy.The examination                            was otherwise normal on direct and  retroflexion                            views. Recommendation:           - Repeat colonoscopy in 5 years for surveillance.                           - Patient has a contact number available for                            emergencies. The signs and symptoms of potential                            delayed complications were discussed with the                            patient. Return to normal activities tomorrow.                            Written discharge instructions were provided to the                            patient.                           -  Resume previous diet.                           - Continue present medications.                           - Await pathology results. Docia Chuck. Henrene Pastor, MD 08/28/2017 4:10:15 PM This report has been signed electronically.

## 2017-08-28 NOTE — Progress Notes (Signed)
To recovery, report to RN, VSS. 

## 2017-08-28 NOTE — Progress Notes (Signed)
Called to room to assist during endoscopic procedure.  Patient ID and intended procedure confirmed with present staff. Received instructions for my participation in the procedure from the performing physician.  

## 2017-08-28 NOTE — Op Note (Signed)
[facility] Patient Name: Joshua [name] Procedure Date: 08/28/2017 3:38 PM MRN: [id number] Endoscopist: Docia Chuck. Henrene Pastor , MD Age: 71 Referring MD:  Date of Birth: 06-Feb-1946 Gender: Male Account #: [id number] Procedure:                Upper GI endoscopy Indications:              Esophageal reflux, Heme positive stool Medicines:                Monitored Anesthesia Care Procedure:                Pre-Anesthesia Assessment:                           - Prior to the procedure, a History and Physical                            was performed, and patient medications and                            allergies were reviewed. The patient's tolerance of                            previous anesthesia was also reviewed. The risks                            and benefits of the procedure and the sedation                            options and risks were discussed with the patient.                            All questions were answered, and informed consent                            was obtained. Prior Anticoagulants: The patient has                            taken no previous anticoagulant or antiplatelet                            agents. ASA Grade Assessment: II - A patient with                            mild systemic disease. After reviewing the risks                            and benefits, the patient was deemed in                            satisfactory condition to undergo the procedure.                           After obtaining informed consent, the endoscope was  passed under direct vision. Throughout the                            procedure, the patient's blood pressure, pulse, and                            oxygen saturations were monitored continuously. The                            Endoscope was introduced through the mouth, and                            advanced to the second part of duodenum. The upper                            GI endoscopy was  accomplished without difficulty.                            The patient tolerated the procedure well. Scope In: Scope Out: Findings:                 Mild reflux esophagitis as manifested by edema and                            friability of the mucosa Z line was present..                           The esophagus was otherwise normal.                           The stomach was normal.                           The examined duodenum was normal.                           The cardia and gastric fundus were normal on                            retroflexion. Complications:            No immediate complications. Estimated Blood Loss:     Estimated blood loss: none. Impression:               1. GERD with Mild esophagitis                           2. Otherwise normal EGD Recommendation:           - Patient has a contact number available for                            emergencies. The signs and symptoms of potential                            delayed complications were discussed with the  patient. Return to normal activities tomorrow.                            Written discharge instructions were provided to the                            patient.                           - Resume previous diet.                           - Continue present medications.                           - If ranitidine does not control her reflux                            symptoms, recommend omeprazole 20 mg daily. May be                            obtained over-the-counter. Docia Chuck. Henrene Pastor, MD 08/28/2017 4:17:31 PM This report has been signed electronically.

## 2017-08-29 ENCOUNTER — Telehealth: Payer: Self-pay

## 2017-08-29 NOTE — Telephone Encounter (Signed)
  Follow up Call-  Call back number 08/28/2017  Post procedure Call Back phone  # [phone]  Permission to leave phone message Yes  Some recent data might be hidden     Patient questions:  Do you have a fever, pain , or abdominal swelling? No. Pain Score  0 *  Have you tolerated food without any problems? Yes.    Have you been able to return to your normal activities? Yes.    Do you have any questions about your discharge instructions: Diet   No. Medications  No. Follow up visit  No.  Do you have questions or concerns about your Care? No.  Actions: * If pain score is 4 or above: No action needed, pain <4.  No problems noted per pt. maw

## 2017-09-04 ENCOUNTER — Encounter: Payer: Self-pay | Admitting: Internal Medicine

## 2017-09-24 DIAGNOSIS — M545 Low back pain: Secondary | ICD-10-CM | POA: Diagnosis not present

## 2017-09-24 DIAGNOSIS — Z1389 Encounter for screening for other disorder: Secondary | ICD-10-CM | POA: Diagnosis not present

## 2017-09-24 DIAGNOSIS — E11319 Type 2 diabetes mellitus with unspecified diabetic retinopathy without macular edema: Secondary | ICD-10-CM | POA: Diagnosis not present

## 2017-09-24 DIAGNOSIS — I10 Essential (primary) hypertension: Secondary | ICD-10-CM | POA: Diagnosis not present

## 2017-09-24 DIAGNOSIS — I7389 Other specified peripheral vascular diseases: Secondary | ICD-10-CM | POA: Diagnosis not present

## 2017-09-24 DIAGNOSIS — F17200 Nicotine dependence, unspecified, uncomplicated: Secondary | ICD-10-CM | POA: Diagnosis not present

## 2017-09-24 DIAGNOSIS — Z794 Long term (current) use of insulin: Secondary | ICD-10-CM | POA: Diagnosis not present

## 2017-09-24 DIAGNOSIS — G7000 Myasthenia gravis without (acute) exacerbation: Secondary | ICD-10-CM | POA: Diagnosis not present

## 2017-09-24 DIAGNOSIS — Z6837 Body mass index (BMI) 37.0-37.9, adult: Secondary | ICD-10-CM | POA: Diagnosis not present

## 2017-09-30 ENCOUNTER — Ambulatory Visit: Payer: PPO

## 2017-10-06 ENCOUNTER — Other Ambulatory Visit: Payer: Self-pay | Admitting: Neurology

## 2017-10-09 ENCOUNTER — Ambulatory Visit (INDEPENDENT_AMBULATORY_CARE_PROVIDER_SITE_OTHER): Payer: PPO | Admitting: *Deleted

## 2017-10-09 DIAGNOSIS — E538 Deficiency of other specified B group vitamins: Secondary | ICD-10-CM | POA: Diagnosis not present

## 2017-10-09 MED ORDER — CYANOCOBALAMIN 1000 MCG/ML IJ SOLN
1000.0000 ug | Freq: Once | INTRAMUSCULAR | Status: AC
  Administered 2017-10-09: 1000 ug via INTRAMUSCULAR

## 2017-10-09 NOTE — Progress Notes (Signed)
Pt here for B 12 injection.  Under aseptic technique cyanocobalamin 1000mcg/1ml IM given R deltoid.  Tolerated well.  Bandaid applied.  

## 2017-11-11 ENCOUNTER — Ambulatory Visit (INDEPENDENT_AMBULATORY_CARE_PROVIDER_SITE_OTHER): Payer: PPO | Admitting: *Deleted

## 2017-11-11 DIAGNOSIS — E538 Deficiency of other specified B group vitamins: Secondary | ICD-10-CM | POA: Diagnosis not present

## 2017-11-11 MED ORDER — CYANOCOBALAMIN 1000 MCG/ML IJ SOLN
1000.0000 ug | Freq: Once | INTRAMUSCULAR | Status: AC
  Administered 2017-11-11: 1000 ug via INTRAMUSCULAR

## 2017-11-11 NOTE — Progress Notes (Addendum)
Patient identified per protocol. Vit B12 injection given IM in left deltoid. Patient tolerated well.  Lot #[id number], exp date 09/20

## 2017-11-16 ENCOUNTER — Other Ambulatory Visit: Payer: Self-pay | Admitting: Adult Health

## 2017-11-27 ENCOUNTER — Encounter: Payer: Self-pay | Admitting: *Deleted

## 2017-12-12 ENCOUNTER — Ambulatory Visit (INDEPENDENT_AMBULATORY_CARE_PROVIDER_SITE_OTHER): Payer: PPO

## 2017-12-12 DIAGNOSIS — E538 Deficiency of other specified B group vitamins: Secondary | ICD-10-CM | POA: Diagnosis not present

## 2017-12-12 MED ORDER — CYANOCOBALAMIN 1000 MCG/ML IJ SOLN
1000.0000 ug | Freq: Once | INTRAMUSCULAR | Status: AC
  Administered 2017-12-12: 1000 ug via INTRAMUSCULAR

## 2017-12-31 ENCOUNTER — Encounter: Payer: Self-pay | Admitting: Neurology

## 2017-12-31 ENCOUNTER — Ambulatory Visit: Payer: PPO | Admitting: Neurology

## 2017-12-31 VITALS — BP 105/60 | HR 78 | Ht 68.0 in | Wt 245.2 lb

## 2017-12-31 DIAGNOSIS — G7000 Myasthenia gravis without (acute) exacerbation: Secondary | ICD-10-CM | POA: Diagnosis not present

## 2017-12-31 DIAGNOSIS — Z5181 Encounter for therapeutic drug level monitoring: Secondary | ICD-10-CM

## 2017-12-31 MED ORDER — MYCOPHENOLATE MOFETIL 500 MG PO TABS: 500.0000 mg | ORAL_TABLET | Freq: Two times a day (BID) | ORAL | 0 refills | Status: DC

## 2017-12-31 NOTE — Progress Notes (Signed)
Reason for visit: Myasthenia gravis  Joshua [name] is an 72 y.o. male  History of present illness:  Joshua [name] is a 72 year old right-handed white male with a history of myasthenia gravis with ocular features.  The patient has been on CellCept taking 500 mg in the morning and 1000 mg in the evening, he is not on prednisone, he takes Mestinon 30 mg 3 times daily.  The patient does have diabetes.  He has not noted any double vision problems or ptosis.  He denies any problems with chewing or swallowing or difficulty with weakness of the arms.  Overall, his energy level is fairly good but he has recently developed some low back pain.  He takes care of his wife who has dementia.  The patient returns to this office for evaluation.  Past Medical History:  Diagnosis Date  . Anemia   . Anxiety   . Arthritis   . Basal cell carcinoma   . Carpal tunnel syndrome, bilateral   . Chronic low back pain 12/07/2014  . Colon cancer ([place]) 2006  . Colon polyps   . Diabetes ([place])   . Diverticulosis   . DJD (degenerative joint disease)   . GERD (gastroesophageal reflux disease)   . Hemorrhoids   . Hyperlipidemia   . Hypertension   . Morbid obesity ([place])   . Myasthenia gravis ([place])   . Myasthenia gravis without exacerbation ([place]) 05/19/2013  . Vitamin B12 deficiency     Past Surgical History:  Procedure Laterality Date  . ANKLE ARTHROPLASTY Right 1969  . BASAL CELL CARCINOMA EXCISION     face  . CARPAL TUNNEL WITH CUBITAL TUNNEL Left 06/02/2013   Procedure: CARPAL TUNNEL WITH CUBITAL TUNNEL;  Surgeon: Tennis Must, MD;  Location: [place];  Service: Orthopedics;  Laterality: Left;  . CARPAL TUNNEL WITH CUBITAL TUNNEL Right 07/14/2013   Procedure: RIGHT CARPAL TUNNEL AND CUBITAL TUNNEL RELEASE;  Surgeon: Tennis Must, MD;  Location: [place];  Service: Orthopedics;  Laterality: Right;  . COLONOSCOPY    . LUMBAR LAMINECTOMY/DECOMPRESSION MICRODISCECTOMY N/A 05/02/2015    Procedure: LUMBAR LAMINECTOMY/DECOMPRESSION MICRODISCECTOMY 2 LEVELS;  Surgeon: Jovita Gamma, MD;  Location: [place] [facility];  Service: Neurosurgery;  Laterality: N/A;  L3 L4 L5 laminectomies  . PATELLA FRACTURE SURGERY Left 2011  . RIGHT COLECTOMY  2006  . TONSILLECTOMY  1968    Family History  Problem Relation Age of Onset  . Heart disease Mother   . Prostate cancer Maternal Grandfather   . Colon polyps Maternal Grandfather   . Colon cancer Neg Hx     Social history:  reports that he has been smoking cigars.  He has never used smokeless tobacco. He reports that he drinks alcohol. He reports that he does not use drugs.    Allergies  Allergen Reactions  . Aluminum Chlorohydrate Rash  . Niacin And Related Rash    Medications:  Prior to Admission medications   Medication Sig Start Date End Date Taking? Authorizing Provider  aspirin EC 81 MG tablet Take 81 mg by mouth daily.   Yes [provider]  atorvastatin (LIPITOR) 40 MG tablet Take 40 mg by mouth daily.   Yes [provider]  B-D ULTRAFINE III SHORT PEN 31G X 8 MM MISC 200 each by Other route 3 (three) times daily between meals. 04/09/13  Yes [provider]  busPIRone (BUSPAR) 7.5 MG tablet TAKE 1 TABLET BY MOUTH TWICE DAILY 10/07/17  Yes  Kathrynn Ducking, MD  calcium-vitamin D (OSCAL WITH D) 500-200 MG-UNIT tablet Take 1 tablet by mouth daily with breakfast.   Yes [provider]  cetirizine-pseudoephedrine (ZYRTEC-D) 5-120 MG tablet Take 1 tablet by mouth daily as needed for allergies.   Yes [provider]  famotidine (PEPCID) 20 MG tablet Take 20 mg by mouth 2 (two) times daily.   Yes [provider]  gabapentin (NEURONTIN) 300 MG capsule Take 2 capsules daily by mouth. 07/18/17  Yes [provider]  insulin aspart protamine-insulin aspart (NOVOLOG 70/30) (70-30) 100 UNIT/ML injection Inject 50 Units into the skin 2 (two) times daily with a meal. 50 units with  breakfast and before bed.   Yes [provider]  INVOKANA 300 MG TABS Take 1 tablet by mouth daily.  11/26/12  Yes [provider]  ketoconazole (NIZORAL) 2 % shampoo APPLY 1 APPLICATION TO THE SCALP D 08/05/17  Yes [provider]  lisinopril (PRINIVIL,ZESTRIL) 5 MG tablet Take 5 mg by mouth daily. 11/27/13  Yes [provider]  metFORMIN (GLUCOPHAGE) 1000 MG tablet Take 1,000 mg by mouth at bedtime.  09/25/12  Yes [provider]  Multiple Vitamins-Minerals (OCUVITE PO) Take 1 tablet by mouth daily.   Yes [provider]  mycophenolate (CELLCEPT) 500 MG tablet TAKE 1 TABLET BY MOUTH IN THE MORNING, THEN 2 TABLETS IN THE EVENING 11/18/17  Yes Kathrynn Ducking, MD  ONE TOUCH ULTRA TEST test strip 200 each by Other route 3 (three) times daily. 04/09/13  Yes [provider]  Jonetta Speak LANCETS 87F MISC 100 each by Other route 3 (three) times daily. 04/14/13  Yes [provider]  pyridostigmine (MESTINON) 60 MG tablet TAKE 1/2 TABLET(30 MG) BY MOUTH THREE TIMES DAILY 08/05/17  Yes Ward Givens, NP  traMADol (ULTRAM) 50 MG tablet Take 100 mg by mouth 3 (three) times daily.  09/09/12  Yes [provider]    ROS:  Out of a complete 14 system review of symptoms, the patient complains only of the following symptoms, and all other reviewed systems are negative.  Low back pain  Blood pressure 105/60, pulse 78, height 5\' 8"  (1.727 m), weight 245 lb 4 oz (111.2 kg).  Physical Exam  General: The patient is alert and cooperative at the time of the examination.  The patient is moderately obese.  Skin: No significant peripheral edema is noted.   Neurologic Exam  Mental status: The patient is alert and oriented x 3 at the time of the examination. The patient has apparent normal recent and remote memory, with an apparently normal attention span and concentration ability.   Cranial nerves: Facial symmetry is present.  Speech is normal, no aphasia or dysarthria is noted. Extraocular movements are full. Visual fields are full.  With superior gaze for 1 minute, no divergence of gaze, subjective double vision, or ptosis is seen.  Motor: The patient has good strength in all 4 extremities.  With arms outstretched for 1 minute, no fatigable weakness of the deltoid muscles is seen.  Sensory examination: Soft touch sensation is symmetric on the face, arms, and legs.  Coordination: The patient has good finger-nose-finger and heel-to-shin bilaterally.  Gait and station: The patient has a normal gait. Tandem gait is normal. Romberg is negative. No drift is seen.  Reflexes: Deep tendon reflexes are symmetric.   Assessment/Plan:  1.  Myasthenia gravis  The patient will try to reduce his CellCept taking 500 mg twice daily.  He will  have blood work done today, he will follow-up in 6 months.  He seems to be quite stable with his myasthenia currently.  Jill Alexanders MD 12/31/2017 4:35 PM  [facility] [address] [place], [place] [address]  Phone [phone] Fax [phone]

## 2017-12-31 NOTE — Patient Instructions (Signed)
   Reduce the cellcept to 500 mg twice a day.

## 2018-01-01 ENCOUNTER — Telehealth: Payer: Self-pay | Admitting: Neurology

## 2018-01-01 LAB — COMPREHENSIVE METABOLIC PANEL
ALBUMIN: 4 g/dL (ref 3.5–4.8)
ALK PHOS: 82 IU/L (ref 39–117)
ALT: 9 IU/L (ref 0–44)
AST: 9 IU/L (ref 0–40)
Albumin/Globulin Ratio: 2 (ref 1.2–2.2)
BUN/Creatinine Ratio: 18 (ref 10–24)
BUN: 18 mg/dL (ref 8–27)
Bilirubin Total: 0.4 mg/dL (ref 0.0–1.2)
CALCIUM: 9.1 mg/dL (ref 8.6–10.2)
CO2: 24 mmol/L (ref 20–29)
CREATININE: 1.02 mg/dL (ref 0.76–1.27)
Chloride: 107 mmol/L — ABNORMAL HIGH (ref 96–106)
GFR calc Af Amer: 86 mL/min/{1.73_m2} (ref >59)
GFR, EST NON AFRICAN AMERICAN: 74 mL/min/{1.73_m2} (ref >59)
GLUCOSE: 44 mg/dL — AB (ref 65–99)
Globulin, Total: 2 g/dL (ref 1.5–4.5)
Potassium: 4.7 mmol/L (ref 3.5–5.2)
Sodium: 143 mmol/L (ref 134–144)
Total Protein: 6 g/dL (ref 6.0–8.5)

## 2018-01-01 LAB — CBC WITH DIFFERENTIAL/PLATELET
BASOS: 0 %
Basophils Absolute: 0 10*3/uL (ref 0.0–0.2)
EOS (ABSOLUTE): 0.1 10*3/uL (ref 0.0–0.4)
EOS: 1 %
HEMATOCRIT: 43.2 % (ref 37.5–51.0)
HEMOGLOBIN: 14 g/dL (ref 13.0–17.7)
IMMATURE GRANS (ABS): 0 10*3/uL (ref 0.0–0.1)
IMMATURE GRANULOCYTES: 0 %
Lymphocytes Absolute: 2.3 10*3/uL (ref 0.7–3.1)
Lymphs: 27 %
MCH: 30.3 pg (ref 26.6–33.0)
MCHC: 32.4 g/dL (ref 31.5–35.7)
MCV: 94 fL (ref 79–97)
MONOCYTES: 10 %
Monocytes Absolute: 0.9 10*3/uL (ref 0.1–0.9)
NEUTROS PCT: 62 %
Neutrophils Absolute: 5.3 10*3/uL (ref 1.4–7.0)
Platelets: 319 10*3/uL (ref 150–379)
RBC: 4.62 x10E6/uL (ref 4.14–5.80)
RDW: 13.7 % (ref 12.3–15.4)
WBC: 8.5 10*3/uL (ref 3.4–10.8)

## 2018-01-01 NOTE — Telephone Encounter (Signed)
I called the patient.  The blood work is unremarkable exception of a blood sugar of 44 and a slightly elevated chloride level.  I believe that the patient is having frequent hypoglycemic events, the patient may need to readjust his insulin dosing, discuss this with his primary care physician.

## 2018-01-03 DIAGNOSIS — I10 Essential (primary) hypertension: Secondary | ICD-10-CM | POA: Diagnosis not present

## 2018-01-03 DIAGNOSIS — E7849 Other hyperlipidemia: Secondary | ICD-10-CM | POA: Diagnosis not present

## 2018-01-03 DIAGNOSIS — C189 Malignant neoplasm of colon, unspecified: Secondary | ICD-10-CM | POA: Diagnosis not present

## 2018-01-03 DIAGNOSIS — G7000 Myasthenia gravis without (acute) exacerbation: Secondary | ICD-10-CM | POA: Diagnosis not present

## 2018-01-03 DIAGNOSIS — Z794 Long term (current) use of insulin: Secondary | ICD-10-CM | POA: Diagnosis not present

## 2018-01-03 DIAGNOSIS — E11319 Type 2 diabetes mellitus with unspecified diabetic retinopathy without macular edema: Secondary | ICD-10-CM | POA: Diagnosis not present

## 2018-01-03 DIAGNOSIS — Z6836 Body mass index (BMI) 36.0-36.9, adult: Secondary | ICD-10-CM | POA: Diagnosis not present

## 2018-01-03 DIAGNOSIS — I7389 Other specified peripheral vascular diseases: Secondary | ICD-10-CM | POA: Diagnosis not present

## 2018-01-13 ENCOUNTER — Ambulatory Visit (INDEPENDENT_AMBULATORY_CARE_PROVIDER_SITE_OTHER): Payer: PPO

## 2018-01-13 DIAGNOSIS — E538 Deficiency of other specified B group vitamins: Secondary | ICD-10-CM

## 2018-01-13 MED ORDER — CYANOCOBALAMIN 1000 MCG/ML IJ SOLN
1000.0000 ug | Freq: Once | INTRAMUSCULAR | Status: AC
  Administered 2018-01-13: 1000 ug via INTRAMUSCULAR

## 2018-02-13 ENCOUNTER — Other Ambulatory Visit: Payer: PPO

## 2018-02-13 ENCOUNTER — Ambulatory Visit (INDEPENDENT_AMBULATORY_CARE_PROVIDER_SITE_OTHER): Payer: PPO

## 2018-02-13 DIAGNOSIS — H10413 Chronic giant papillary conjunctivitis, bilateral: Secondary | ICD-10-CM | POA: Diagnosis not present

## 2018-02-13 DIAGNOSIS — H2513 Age-related nuclear cataract, bilateral: Secondary | ICD-10-CM | POA: Diagnosis not present

## 2018-02-13 DIAGNOSIS — E538 Deficiency of other specified B group vitamins: Secondary | ICD-10-CM

## 2018-02-13 DIAGNOSIS — H31093 Other chorioretinal scars, bilateral: Secondary | ICD-10-CM | POA: Diagnosis not present

## 2018-02-13 DIAGNOSIS — H43391 Other vitreous opacities, right eye: Secondary | ICD-10-CM | POA: Diagnosis not present

## 2018-02-13 DIAGNOSIS — E119 Type 2 diabetes mellitus without complications: Secondary | ICD-10-CM | POA: Diagnosis not present

## 2018-02-13 DIAGNOSIS — H35373 Puckering of macula, bilateral: Secondary | ICD-10-CM | POA: Diagnosis not present

## 2018-02-13 MED ORDER — CYANOCOBALAMIN 1000 MCG/ML IJ SOLN
1000.0000 ug | Freq: Once | INTRAMUSCULAR | Status: AC
  Administered 2018-02-13: 1000 ug via INTRAMUSCULAR

## 2018-02-26 DIAGNOSIS — E1151 Type 2 diabetes mellitus with diabetic peripheral angiopathy without gangrene: Secondary | ICD-10-CM | POA: Diagnosis not present

## 2018-02-26 DIAGNOSIS — Z125 Encounter for screening for malignant neoplasm of prostate: Secondary | ICD-10-CM | POA: Diagnosis not present

## 2018-02-26 DIAGNOSIS — I10 Essential (primary) hypertension: Secondary | ICD-10-CM | POA: Diagnosis not present

## 2018-02-26 DIAGNOSIS — E7849 Other hyperlipidemia: Secondary | ICD-10-CM | POA: Diagnosis not present

## 2018-03-04 DIAGNOSIS — C189 Malignant neoplasm of colon, unspecified: Secondary | ICD-10-CM | POA: Diagnosis not present

## 2018-03-04 DIAGNOSIS — I10 Essential (primary) hypertension: Secondary | ICD-10-CM | POA: Diagnosis not present

## 2018-03-04 DIAGNOSIS — I7389 Other specified peripheral vascular diseases: Secondary | ICD-10-CM | POA: Diagnosis not present

## 2018-03-04 DIAGNOSIS — E7849 Other hyperlipidemia: Secondary | ICD-10-CM | POA: Diagnosis not present

## 2018-03-04 DIAGNOSIS — E11319 Type 2 diabetes mellitus with unspecified diabetic retinopathy without macular edema: Secondary | ICD-10-CM | POA: Diagnosis not present

## 2018-03-04 DIAGNOSIS — Z6837 Body mass index (BMI) 37.0-37.9, adult: Secondary | ICD-10-CM | POA: Diagnosis not present

## 2018-03-04 DIAGNOSIS — Z794 Long term (current) use of insulin: Secondary | ICD-10-CM | POA: Diagnosis not present

## 2018-03-04 DIAGNOSIS — Z0000 Encounter for general adult medical examination without abnormal findings: Secondary | ICD-10-CM | POA: Diagnosis not present

## 2018-03-04 DIAGNOSIS — F17200 Nicotine dependence, unspecified, uncomplicated: Secondary | ICD-10-CM | POA: Diagnosis not present

## 2018-03-04 DIAGNOSIS — M545 Low back pain: Secondary | ICD-10-CM | POA: Diagnosis not present

## 2018-03-04 DIAGNOSIS — G7000 Myasthenia gravis without (acute) exacerbation: Secondary | ICD-10-CM | POA: Diagnosis not present

## 2018-03-06 ENCOUNTER — Other Ambulatory Visit: Payer: Self-pay | Admitting: Neurology

## 2018-03-13 ENCOUNTER — Other Ambulatory Visit: Payer: Self-pay | Admitting: Neurology

## 2018-03-17 ENCOUNTER — Ambulatory Visit: Payer: PPO

## 2018-03-31 ENCOUNTER — Ambulatory Visit (INDEPENDENT_AMBULATORY_CARE_PROVIDER_SITE_OTHER): Payer: PPO | Admitting: *Deleted

## 2018-03-31 ENCOUNTER — Telehealth: Payer: Self-pay | Admitting: Neurology

## 2018-03-31 DIAGNOSIS — E538 Deficiency of other specified B group vitamins: Secondary | ICD-10-CM

## 2018-03-31 DIAGNOSIS — Z5181 Encounter for therapeutic drug level monitoring: Secondary | ICD-10-CM

## 2018-03-31 MED ORDER — CYANOCOBALAMIN 1000 MCG/ML IJ SOLN
1000.0000 ug | Freq: Once | INTRAMUSCULAR | Status: AC
  Administered 2018-03-31: 1000 ug via INTRAMUSCULAR

## 2018-03-31 NOTE — Telephone Encounter (Signed)
I called the patient.  He is to come in for blood work on the CellCept, I have placed the orders.

## 2018-03-31 NOTE — Progress Notes (Signed)
Pt here for B 12 injection.  Under aseptic technique cyanocobalamin 1000mcg/1ml IM given L deltoid.  Tolerated well.  Bandaid applied.  

## 2018-04-10 ENCOUNTER — Other Ambulatory Visit (INDEPENDENT_AMBULATORY_CARE_PROVIDER_SITE_OTHER): Payer: Self-pay

## 2018-04-10 DIAGNOSIS — Z5181 Encounter for therapeutic drug level monitoring: Secondary | ICD-10-CM

## 2018-04-10 DIAGNOSIS — Z0289 Encounter for other administrative examinations: Secondary | ICD-10-CM

## 2018-04-11 ENCOUNTER — Telehealth: Payer: Self-pay | Admitting: *Deleted

## 2018-04-11 LAB — COMPREHENSIVE METABOLIC PANEL
A/G RATIO: 2 (ref 1.2–2.2)
ALBUMIN: 4 g/dL (ref 3.5–4.8)
ALT: 11 IU/L (ref 0–44)
AST: 11 IU/L (ref 0–40)
Alkaline Phosphatase: 85 IU/L (ref 39–117)
BUN/Creatinine Ratio: 16 (ref 10–24)
BUN: 18 mg/dL (ref 8–27)
Bilirubin Total: 0.6 mg/dL (ref 0.0–1.2)
CHLORIDE: 104 mmol/L (ref 96–106)
CO2: 25 mmol/L (ref 20–29)
Calcium: 9.1 mg/dL (ref 8.6–10.2)
Creatinine, Ser: 1.11 mg/dL (ref 0.76–1.27)
GFR, EST AFRICAN AMERICAN: 77 mL/min/{1.73_m2} (ref >59)
GFR, EST NON AFRICAN AMERICAN: 67 mL/min/{1.73_m2} (ref >59)
Globulin, Total: 2 g/dL (ref 1.5–4.5)
Glucose: 95 mg/dL (ref 65–99)
POTASSIUM: 4.2 mmol/L (ref 3.5–5.2)
SODIUM: 143 mmol/L (ref 134–144)
TOTAL PROTEIN: 6 g/dL (ref 6.0–8.5)

## 2018-04-11 LAB — CBC WITH DIFFERENTIAL/PLATELET
BASOS: 0 %
Basophils Absolute: 0 10*3/uL (ref 0.0–0.2)
EOS (ABSOLUTE): 0.1 10*3/uL (ref 0.0–0.4)
EOS: 1 %
HEMOGLOBIN: 14.1 g/dL (ref 13.0–17.7)
Hematocrit: 41.5 % (ref 37.5–51.0)
IMMATURE GRANS (ABS): 0 10*3/uL (ref 0.0–0.1)
IMMATURE GRANULOCYTES: 0 %
LYMPHS: 23 %
Lymphocytes Absolute: 2 10*3/uL (ref 0.7–3.1)
MCH: 31 pg (ref 26.6–33.0)
MCHC: 34 g/dL (ref 31.5–35.7)
MCV: 91 fL (ref 79–97)
MONOCYTES: 10 %
Monocytes Absolute: 0.9 10*3/uL (ref 0.1–0.9)
NEUTROS ABS: 5.8 10*3/uL (ref 1.4–7.0)
NEUTROS PCT: 66 %
PLATELETS: 292 10*3/uL (ref 150–450)
RBC: 4.55 x10E6/uL (ref 4.14–5.80)
RDW: 14.3 % (ref 12.3–15.4)
WBC: 8.8 10*3/uL (ref 3.4–10.8)

## 2018-04-11 NOTE — Telephone Encounter (Signed)
Called and spoke with pt about unremarkable labs per CW,MD note. Pt verbalized understanding and appreciation for call.

## 2018-04-11 NOTE — Telephone Encounter (Signed)
-----   Message from Kathrynn Ducking, MD sent at 04/11/2018  7:28 AM EDT -----  The blood work results are unremarkable. Please call the patient.  ----- Message ----- From: Lavone Neri Lab Results In Sent: 04/11/2018   5:39 AM To: Kathrynn Ducking, MD

## 2018-05-01 ENCOUNTER — Ambulatory Visit (INDEPENDENT_AMBULATORY_CARE_PROVIDER_SITE_OTHER): Payer: PPO | Admitting: *Deleted

## 2018-05-01 DIAGNOSIS — E538 Deficiency of other specified B group vitamins: Secondary | ICD-10-CM | POA: Diagnosis not present

## 2018-05-01 MED ORDER — CYANOCOBALAMIN 1000 MCG/ML IJ SOLN
1000.0000 ug | Freq: Once | INTRAMUSCULAR | Status: AC
  Administered 2018-05-01: 1000 ug via INTRAMUSCULAR

## 2018-05-01 NOTE — Progress Notes (Signed)
Patient in office for Vit B12 injection. Patient identified per protocol. Vitamin B12 injection given, band aid applied. Patient tolerated well.

## 2018-06-02 ENCOUNTER — Ambulatory Visit (INDEPENDENT_AMBULATORY_CARE_PROVIDER_SITE_OTHER): Payer: PPO | Admitting: *Deleted

## 2018-06-02 DIAGNOSIS — E538 Deficiency of other specified B group vitamins: Secondary | ICD-10-CM | POA: Diagnosis not present

## 2018-06-02 MED ORDER — CYANOCOBALAMIN 1000 MCG/ML IJ SOLN
1000.0000 ug | Freq: Once | INTRAMUSCULAR | Status: AC
  Administered 2018-06-02: 1000 ug via INTRAMUSCULAR

## 2018-06-02 NOTE — Progress Notes (Signed)
Gave Vitamin B12 1020mcg/ml IM in left deltoid. Cleaned with alcohol wipe prior to injection. Pt tolerated well.  Pt was reminded of upcoming f/u on 07/10/18 at 230pm with MM,NP. She will also get next b12 inj at this appt. He was given reminder card.

## 2018-07-10 ENCOUNTER — Telehealth: Payer: Self-pay | Admitting: Neurology

## 2018-07-10 ENCOUNTER — Ambulatory Visit: Payer: PPO | Admitting: Adult Health

## 2018-07-10 DIAGNOSIS — G7000 Myasthenia gravis without (acute) exacerbation: Secondary | ICD-10-CM

## 2018-07-10 NOTE — Addendum Note (Signed)
Addended by: Kathrynn Ducking on: 07/10/2018 03:07 PM   Modules accepted: Orders

## 2018-07-10 NOTE — Telephone Encounter (Signed)
The patient has no showed on 3 occasions within the last 12 months, he will be discharged from our practice, I will make a referral to [facility] neurology.

## 2018-07-10 NOTE — Telephone Encounter (Signed)
Pt has called stating he has a cold as to why he did not make his appointment today. Pt asked to be r/s It was pointed out to pt that on 07-29-2017,09-30-2017 and today 07-10-18 put him as a 3rd NoShow.  Pt was told that he would not be r/s at this time but instead a message would be sent to Dr Jannifer Franklin to review.  Pt confirmed [phone] to be his best contact #

## 2018-07-11 ENCOUNTER — Encounter: Payer: Self-pay | Admitting: Adult Health

## 2018-07-15 ENCOUNTER — Encounter: Payer: Self-pay | Admitting: Neurology

## 2018-07-15 DIAGNOSIS — G7000 Myasthenia gravis without (acute) exacerbation: Secondary | ICD-10-CM | POA: Diagnosis not present

## 2018-07-15 DIAGNOSIS — Z6835 Body mass index (BMI) 35.0-35.9, adult: Secondary | ICD-10-CM | POA: Diagnosis not present

## 2018-07-15 DIAGNOSIS — Z794 Long term (current) use of insulin: Secondary | ICD-10-CM | POA: Diagnosis not present

## 2018-07-15 DIAGNOSIS — M545 Low back pain: Secondary | ICD-10-CM | POA: Diagnosis not present

## 2018-07-15 DIAGNOSIS — I10 Essential (primary) hypertension: Secondary | ICD-10-CM | POA: Diagnosis not present

## 2018-07-15 DIAGNOSIS — E1151 Type 2 diabetes mellitus with diabetic peripheral angiopathy without gangrene: Secondary | ICD-10-CM | POA: Diagnosis not present

## 2018-07-22 ENCOUNTER — Encounter: Payer: Self-pay | Admitting: Neurology

## 2018-07-23 ENCOUNTER — Other Ambulatory Visit: Payer: Self-pay | Admitting: Neurology

## 2018-07-29 DIAGNOSIS — E538 Deficiency of other specified B group vitamins: Secondary | ICD-10-CM | POA: Diagnosis not present

## 2018-08-05 ENCOUNTER — Other Ambulatory Visit: Payer: Self-pay | Admitting: Adult Health

## 2018-08-12 DIAGNOSIS — L570 Actinic keratosis: Secondary | ICD-10-CM | POA: Diagnosis not present

## 2018-08-12 DIAGNOSIS — Z85828 Personal history of other malignant neoplasm of skin: Secondary | ICD-10-CM | POA: Diagnosis not present

## 2018-08-12 DIAGNOSIS — D2271 Melanocytic nevi of right lower limb, including hip: Secondary | ICD-10-CM | POA: Diagnosis not present

## 2018-08-12 DIAGNOSIS — D2261 Melanocytic nevi of right upper limb, including shoulder: Secondary | ICD-10-CM | POA: Diagnosis not present

## 2018-08-12 DIAGNOSIS — L218 Other seborrheic dermatitis: Secondary | ICD-10-CM | POA: Diagnosis not present

## 2018-08-12 DIAGNOSIS — D2272 Melanocytic nevi of left lower limb, including hip: Secondary | ICD-10-CM | POA: Diagnosis not present

## 2018-08-12 DIAGNOSIS — D1801 Hemangioma of skin and subcutaneous tissue: Secondary | ICD-10-CM | POA: Diagnosis not present

## 2018-08-12 DIAGNOSIS — D225 Melanocytic nevi of trunk: Secondary | ICD-10-CM | POA: Diagnosis not present

## 2018-08-12 DIAGNOSIS — D485 Neoplasm of uncertain behavior of skin: Secondary | ICD-10-CM | POA: Diagnosis not present

## 2018-08-12 DIAGNOSIS — D2262 Melanocytic nevi of left upper limb, including shoulder: Secondary | ICD-10-CM | POA: Diagnosis not present

## 2018-08-27 DIAGNOSIS — H01025 Squamous blepharitis left lower eyelid: Secondary | ICD-10-CM | POA: Diagnosis not present

## 2018-08-27 DIAGNOSIS — H353131 Nonexudative age-related macular degeneration, bilateral, early dry stage: Secondary | ICD-10-CM | POA: Diagnosis not present

## 2018-08-27 DIAGNOSIS — H31093 Other chorioretinal scars, bilateral: Secondary | ICD-10-CM | POA: Diagnosis not present

## 2018-08-27 DIAGNOSIS — H01021 Squamous blepharitis right upper eyelid: Secondary | ICD-10-CM | POA: Diagnosis not present

## 2018-08-27 DIAGNOSIS — H01024 Squamous blepharitis left upper eyelid: Secondary | ICD-10-CM | POA: Diagnosis not present

## 2018-08-27 DIAGNOSIS — H35373 Puckering of macula, bilateral: Secondary | ICD-10-CM | POA: Diagnosis not present

## 2018-08-27 DIAGNOSIS — H2513 Age-related nuclear cataract, bilateral: Secondary | ICD-10-CM | POA: Diagnosis not present

## 2018-08-27 DIAGNOSIS — H01022 Squamous blepharitis right lower eyelid: Secondary | ICD-10-CM | POA: Diagnosis not present

## 2018-08-27 DIAGNOSIS — E119 Type 2 diabetes mellitus without complications: Secondary | ICD-10-CM | POA: Diagnosis not present

## 2018-09-02 DIAGNOSIS — E538 Deficiency of other specified B group vitamins: Secondary | ICD-10-CM | POA: Diagnosis not present

## 2018-10-06 ENCOUNTER — Other Ambulatory Visit (INDEPENDENT_AMBULATORY_CARE_PROVIDER_SITE_OTHER): Payer: PPO

## 2018-10-06 ENCOUNTER — Encounter: Payer: Self-pay | Admitting: Neurology

## 2018-10-06 ENCOUNTER — Ambulatory Visit: Payer: PPO | Admitting: Neurology

## 2018-10-06 VITALS — BP 104/60 | HR 68 | Ht 69.0 in | Wt 241.0 lb

## 2018-10-06 DIAGNOSIS — E538 Deficiency of other specified B group vitamins: Secondary | ICD-10-CM

## 2018-10-06 DIAGNOSIS — Z79899 Other long term (current) drug therapy: Secondary | ICD-10-CM

## 2018-10-06 DIAGNOSIS — G7000 Myasthenia gravis without (acute) exacerbation: Secondary | ICD-10-CM

## 2018-10-06 LAB — COMPREHENSIVE METABOLIC PANEL
ALBUMIN: 3.8 g/dL (ref 3.5–5.2)
ALK PHOS: 70 U/L (ref 39–117)
ALT: 10 U/L (ref 0–53)
AST: 10 U/L (ref 0–37)
BUN: 13 mg/dL (ref 6–23)
CO2: 28 mEq/L (ref 19–32)
Calcium: 9.1 mg/dL (ref 8.4–10.5)
Chloride: 106 mEq/L (ref 96–112)
Creatinine, Ser: 0.91 mg/dL (ref 0.40–1.50)
GFR: 82.02 mL/min (ref >60.00)
Glucose, Bld: 150 mg/dL — ABNORMAL HIGH (ref 70–99)
Potassium: 3.9 mEq/L (ref 3.5–5.1)
Sodium: 141 mEq/L (ref 135–145)
Total Bilirubin: 0.7 mg/dL (ref 0.2–1.2)
Total Protein: 6.1 g/dL (ref 6.0–8.3)

## 2018-10-06 LAB — CBC WITH DIFFERENTIAL/PLATELET
Basophils Absolute: 0 10*3/uL (ref 0.0–0.1)
Basophils Relative: 0.3 % (ref 0.0–3.0)
Eosinophils Absolute: 0.1 10*3/uL (ref 0.0–0.7)
Eosinophils Relative: 1.5 % (ref 0.0–5.0)
HCT: 40.7 % (ref 39.0–52.0)
Hemoglobin: 13.3 g/dL (ref 13.0–17.0)
Lymphocytes Relative: 22.8 % (ref 12.0–46.0)
Lymphs Abs: 1.5 10*3/uL (ref 0.7–4.0)
MCHC: 32.8 g/dL (ref 30.0–36.0)
MCV: 94.9 fl (ref 78.0–100.0)
Monocytes Absolute: 0.6 10*3/uL (ref 0.1–1.0)
Monocytes Relative: 9.3 % (ref 3.0–12.0)
Neutro Abs: 4.4 10*3/uL (ref 1.4–7.7)
Neutrophils Relative %: 66.1 % (ref 43.0–77.0)
Platelets: 253 10*3/uL (ref 150.0–400.0)
RBC: 4.29 Mil/uL (ref 4.22–5.81)
RDW: 13.3 % (ref 11.5–15.5)
WBC: 6.7 10*3/uL (ref 4.0–10.5)

## 2018-10-06 MED ORDER — MYCOPHENOLATE MOFETIL 500 MG PO TABS: 500.0000 mg | ORAL_TABLET | Freq: Two times a day (BID) | ORAL | 3 refills | Status: DC

## 2018-10-06 MED ORDER — CYANOCOBALAMIN 1000 MCG/ML IJ SOLN
1000.0000 ug | Freq: Once | INTRAMUSCULAR | Status: AC
  Administered 2018-10-06: 1000 ug via INTRAMUSCULAR

## 2018-10-06 MED ORDER — PYRIDOSTIGMINE BROMIDE 60 MG PO TABS: ORAL_TABLET | ORAL | 3 refills | Status: DC

## 2018-10-06 NOTE — Patient Instructions (Addendum)
Check blood work  Continue cellcept 500mg  twice daily and mestinon 30mg  twice daily  Continue monthly vitamin B12 injections with your primary care doctor  Return to clinic in 6 months

## 2018-10-06 NOTE — Progress Notes (Signed)
[facility] Clinic Note - Initial Visit   Date: 10/06/18  ANTIONIO NEGRON MRN: [id number] DOB: [birth year]   Dear Dr. Philip Aspen:  Thank you for your kind referral of Joshua [name] for consultation of myasthenia gravis. Although his history is well known to you, please allow [place] to reiterate it for the purpose of our medical record. The patient was accompanied to the clinic by self.    History of Present Illness: Joshua [name] is a 73 y.o. right-handed Caucasian male with myasthenia gravis (2012), insulin-dependent diabetes mellitus, hypertension, hyperlipidemia, adenocarcinoma of the colon s/p right hemicolectomy, and chronic low back pain presenting for evaluation of myasthenia gravis.    He was diagnosed in 2012 after presenting with dysphagia, ptosis, and difficulty with chewing.  He did not have limb weakness or double vision. He was hospitalized and treated with IVIG after which, he was started on prednisone 40mg  and Cellcept. He was seeing Dr. Erling Cruz and then transferred care with Dr. Jannifer Franklin at [facility].  He was previously taking 1000mg  twice daily, which has been slowly tapered.   He has not been hospitalized since his diagnosis.  He is currently taking mestinon 30mg  twice daily Cellcept 500mg  twice daily.  He has mild right ptosis, no double vision, difficulty swallowing/talking, or limb weakness.  He has not had an exacerbation since 2012 with his initial diagnosis.  He does not know if he is antibody positive.   Out-side paper records, electronic medical record, and images have been reviewed where available and summarized as:  Labs 02/26/2018:  WBC 10, H/H 14.6/45.6, PLT 286, LFTs normal, Cr 1.0, HbA1c 6.5 Lab Results  Component Value Date   VITAMINB12 >2000 (H) 12/20/2016     Past Medical History:  Diagnosis Date  . Anemia   . Anxiety   . Arthritis   . Basal cell carcinoma   . Carpal tunnel syndrome, bilateral   . Chronic low back pain 12/07/2014  . Colon  cancer ([place]) 2006  . Colon polyps   . Diabetes ([place])   . Diverticulosis   . DJD (degenerative joint disease)   . GERD (gastroesophageal reflux disease)   . Hemorrhoids   . Hyperlipidemia   . Hypertension   . Morbid obesity ([place])   . Myasthenia gravis ([place])   . Myasthenia gravis without exacerbation ([place]) 05/19/2013  . Vitamin B12 deficiency     Past Surgical History:  Procedure Laterality Date  . ANKLE ARTHROPLASTY Right 1969  . BASAL CELL CARCINOMA EXCISION     face  . CARPAL TUNNEL WITH CUBITAL TUNNEL Left 06/02/2013   Procedure: CARPAL TUNNEL WITH CUBITAL TUNNEL;  Surgeon: Tennis Must, MD;  Location: [place];  Service: Orthopedics;  Laterality: Left;  . CARPAL TUNNEL WITH CUBITAL TUNNEL Right 07/14/2013   Procedure: RIGHT CARPAL TUNNEL AND CUBITAL TUNNEL RELEASE;  Surgeon: Tennis Must, MD;  Location: [place];  Service: Orthopedics;  Laterality: Right;  . COLONOSCOPY    . LUMBAR LAMINECTOMY/DECOMPRESSION MICRODISCECTOMY N/A 05/02/2015   Procedure: LUMBAR LAMINECTOMY/DECOMPRESSION MICRODISCECTOMY 2 LEVELS;  Surgeon: Jovita Gamma, MD;  Location: [place] [facility];  Service: Neurosurgery;  Laterality: N/A;  L3 L4 L5 laminectomies  . PATELLA FRACTURE SURGERY Left 2011  . RIGHT COLECTOMY  2006  . TONSILLECTOMY  1968     Medications:  Outpatient Encounter Medications as of 10/06/2018  Medication Sig Note  . aspirin EC 81 MG tablet Take 81 mg by mouth daily.   Marland Kitchen atorvastatin (LIPITOR)  40 MG tablet Take 40 mg by mouth daily.   . B-D ULTRAFINE III SHORT PEN 31G X 8 MM MISC 200 each by Other route 3 (three) times daily between meals.   . busPIRone (BUSPAR) 7.5 MG tablet TAKE 1 TABLET BY MOUTH TWICE DAILY   . calcium-vitamin D (OSCAL WITH D) 500-200 MG-UNIT tablet Take 1 tablet by mouth daily with breakfast.   . cetirizine-pseudoephedrine (ZYRTEC-D) 5-120 MG tablet Take 1 tablet by mouth daily as needed for allergies.   . famotidine (PEPCID) 20 MG  tablet Take 20 mg by mouth 2 (two) times daily.   Marland Kitchen gabapentin (NEURONTIN) 300 MG capsule Take 2 capsules daily by mouth.   . insulin aspart protamine-insulin aspart (NOVOLOG 70/30) (70-30) 100 UNIT/ML injection Inject 50 Units into the skin 2 (two) times daily with a meal. 42 units with breakfast and before bed.   . INVOKANA 300 MG TABS Take 1 tablet by mouth daily.    Marland Kitchen ketoconazole (NIZORAL) 2 % shampoo APPLY 1 APPLICATION TO THE SCALP D   . lisinopril (PRINIVIL,ZESTRIL) 5 MG tablet Take 5 mg by mouth daily.   . metFORMIN (GLUCOPHAGE) 1000 MG tablet Take 1,000 mg by mouth at bedtime.    . Multiple Vitamins-Minerals (OCUVITE PO) Take 1 tablet by mouth daily.   . mycophenolate (CELLCEPT) 500 MG tablet Take 1 tablet (500 mg total) by mouth 2 (two) times daily.   . mycophenolate (CELLCEPT) 500 MG tablet TAKE 1 TABLET BY MOUTH IN THE MORNING THEN TAKE 2 TABLETS BY MOUTH IN THE EVENING (Patient taking differently: TAKE 2 TABLETS BY MOUTH IN THE MORNING THEN TAKE 2 TABLETS BY MOUTH IN THE EVENING)   . ONE TOUCH ULTRA TEST test strip 200 each by Other route 3 (three) times daily.   Glory Rosebush DELICA LANCETS 13K MISC 100 each by Other route 3 (three) times daily.   Marland Kitchen pyridostigmine (MESTINON) 60 MG tablet TAKE 1/2 TABLET(30 MG) BY MOUTH THREE TIMES DAILY 12/31/2017: 12/31/17 - reports taking BID.  Marland Kitchen traMADol (ULTRAM) 50 MG tablet Take 100 mg by mouth 2 (two) times daily.     No facility-administered encounter medications on file as of 10/06/2018.      Allergies:  Allergies  Allergen Reactions  . Aluminum Chlorohydrate Rash  . Niacin And Related Rash    Family History: Family History  Problem Relation Age of Onset  . Heart disease Mother   . Prostate cancer Maternal Grandfather   . Colon polyps Maternal Grandfather   . Colon cancer Neg Hx     Social History: Social History   Tobacco Use  . Smoking status: Current Every Day Smoker    Types: Cigars  . Smokeless tobacco: Never Used    Substance Use Topics  . Alcohol use: Yes    Comment: 6 pack a year  . Drug use: No   Social History   Social History Narrative   Lives at home w/ his wife, who has Alzheimer's.  2 story home.  1 child. Education: college.    Patient is right handed.   Patient drinks 2 cups of caffeine daily.    Review of Systems:  CONSTITUTIONAL: No fevers, chills, night sweats, or weight loss.   EYES: No visual changes or eye pain ENT: No hearing changes.  No history of nose bleeds.   RESPIRATORY: No cough, wheezing and shortness of breath.   CARDIOVASCULAR: Negative for chest pain, and palpitations.   GI: Negative for abdominal discomfort, blood in stools or  black stools.  No recent change in bowel habits.   GU:  No history of incontinence.   MUSCLOSKELETAL: +history of joint pain or swelling.  No myalgias.   SKIN: Negative for lesions, rash, and itching.   HEMATOLOGY/ONCOLOGY: Negative for prolonged bleeding, bruising easily, and swollen nodes.  +history of cancer.   ENDOCRINE: Negative for cold or heat intolerance, polydipsia or goiter.   PSYCH:  No depression or anxiety symptoms.   NEURO: As Above.   Vital Signs:  BP 104/60   Pulse 68   Ht 5\' 9"  (1.753 m)   Wt 241 lb (109.3 kg)   SpO2 98%   BMI 35.59 kg/m    General Medical Exam:   General:  Well appearing, comfortable.   Eyes/ENT: see cranial nerve examination.   Neck:  No carotid bruits. Respiratory:  Clear to auscultation, good air entry bilaterally.   Cardiac:  Regular rate and rhythm, + systolic murmur.   Extremities:  No deformities, edema, or skin discoloration.  Skin:  No rashes or lesions.  Neurological Exam: MENTAL STATUS including orientation to time, place, person, recent and remote memory, attention span and concentration, language, and fund of knowledge is normal.  Speech is not dysarthric.  CRANIAL NERVES: II:  No visual field defects.  Unremarkable fundi.   III-IV-VI: Pupils equal round and reactive to light.   Normal conjugate, extra-ocular eye movements in all directions of gaze.  No nystagmus. Mild right ptosis, no worsening with sustained upgaze.   V:  Normal facial sensation.   VII:  Normal facial symmetry and movements. Facial muscles are 5/5 bilaterally. VIII:  Normal hearing and vestibular function.   IX-X:  Normal palatal movement.   XI:  Normal shoulder shrug and head rotation.   XII:  Normal tongue [name] and range of motion, no deviation or fasciculation.  MOTOR:  No atrophy, fasciculations or abnormal movements.  No pronator drift.  Tone is normal.  No fatigability.  Right Upper Extremity:    Left Upper Extremity:    Deltoid  5/5   Deltoid  5/5   Biceps  5/5   Biceps  5/5   Triceps  5/5   Triceps  5/5   Wrist extensors  5/5   Wrist extensors  5/5   Wrist flexors  5/5   Wrist flexors  5/5   Finger extensors  5/5   Finger extensors  5/5   Finger flexors  5/5   Finger flexors  5/5   Dorsal interossei  5/5   Dorsal interossei  5/5   Abductor pollicis  5/5   Abductor pollicis  5/5   Tone (Ashworth scale)  0  Tone (Ashworth scale)  0   Right Lower Extremity:    Left Lower Extremity:    Hip flexors  5/5   Hip flexors  5/5   Hip extensors  5/5   Hip extensors  5/5   Knee flexors  5/5   Knee flexors  5/5   Knee extensors  5/5   Knee extensors  5/5   Dorsiflexors  5/5   Dorsiflexors  5/5   Plantarflexors  5/5   Plantarflexors  5/5   Toe extensors  5/5   Toe extensors  5/5   Toe flexors  5/5   Toe flexors  5/5   Tone (Ashworth scale)  0  Tone (Ashworth scale)  0   MSRs:  Right  Left brachioradialis 2+  brachioradialis 2+  biceps 2+  biceps 2+  triceps 2+  triceps 2+  patellar 2+  patellar 2+  ankle jerk 2+  ankle jerk 2+  Hoffman no  Hoffman no  plantar response down  plantar response down   SENSORY:  Normal and symmetric perception of light touch and vibration.  COORDINATION/GAIT: Normal finger-to- nose-finger.   Intact rapid alternating movements bilaterally. Unable to rise from a chair without using arms due to back pain.  Gait stooped, slow, due to low back pain, unassisted and overall stable.   IMPRESSION: 1.  Myasthenia gravis without exacerbation manifesting with bulbar symptoms in 2012.  He has been doing well on tapering dose of Cellcept and currently take 500mg  BID (maximum dose 1000mg  BID) and mestinon 30mg  BID. Refills provided. Check CBC and CMP for drug monitoring  2.  Vitamin B12 deficiency, continue monthly injections - administered 1025mcg today.  He will continue this with his PCP  Return to clinic in 6 months.    Thank you for allowing me to participate in patient's care.  If I can answer any additional questions, I would be pleased to do so.    Sincerely,    Brailon Don K. Posey Pronto, DO

## 2018-10-07 ENCOUNTER — Telehealth: Payer: Self-pay | Admitting: *Deleted

## 2018-10-07 NOTE — Telephone Encounter (Signed)
-----   Message from Alda Berthold, DO sent at 10/07/2018  7:57 AM EST ----- Please notify patient lab are within normal limits.  I will recheck at his next visit.  Thank you.

## 2018-10-07 NOTE — Telephone Encounter (Signed)
Patient given results

## 2018-11-13 DIAGNOSIS — I10 Essential (primary) hypertension: Secondary | ICD-10-CM | POA: Diagnosis not present

## 2018-11-13 DIAGNOSIS — I7389 Other specified peripheral vascular diseases: Secondary | ICD-10-CM | POA: Diagnosis not present

## 2018-11-13 DIAGNOSIS — G7000 Myasthenia gravis without (acute) exacerbation: Secondary | ICD-10-CM | POA: Diagnosis not present

## 2018-11-13 DIAGNOSIS — E1151 Type 2 diabetes mellitus with diabetic peripheral angiopathy without gangrene: Secondary | ICD-10-CM | POA: Diagnosis not present

## 2018-11-13 DIAGNOSIS — E7849 Other hyperlipidemia: Secondary | ICD-10-CM | POA: Diagnosis not present

## 2018-11-13 DIAGNOSIS — Z794 Long term (current) use of insulin: Secondary | ICD-10-CM | POA: Diagnosis not present

## 2018-11-13 DIAGNOSIS — E538 Deficiency of other specified B group vitamins: Secondary | ICD-10-CM | POA: Diagnosis not present

## 2018-11-13 DIAGNOSIS — Z1331 Encounter for screening for depression: Secondary | ICD-10-CM | POA: Diagnosis not present

## 2018-11-13 DIAGNOSIS — M545 Low back pain: Secondary | ICD-10-CM | POA: Diagnosis not present

## 2018-11-13 DIAGNOSIS — Z6834 Body mass index (BMI) 34.0-34.9, adult: Secondary | ICD-10-CM | POA: Diagnosis not present

## 2018-12-16 DIAGNOSIS — E538 Deficiency of other specified B group vitamins: Secondary | ICD-10-CM | POA: Diagnosis not present

## 2019-02-06 ENCOUNTER — Telehealth: Payer: Self-pay | Admitting: Neurology

## 2019-02-06 NOTE — Telephone Encounter (Signed)
Please advise 

## 2019-02-06 NOTE — Telephone Encounter (Signed)
Patient left message with After hours service needing to let Dr. Posey Pronto know that he has been having Dizziness for about a month. He is fine while sitting and Driving. Decreased from 3 Pills to 2 Pills per day of Mycophenolate 500 MG. Please Call. Thanks

## 2019-02-06 NOTE — Telephone Encounter (Signed)
Dizziness is not a symptom of myasthenia and would not be a side effect of his medication.  Positional lightheadedness can be related to dehydration or blood pressure changes.  Recommend he follow-up with his PCP.   He already has a e-visit with me on 6/12 and we can discuss further at that time.

## 2019-02-10 NOTE — Telephone Encounter (Signed)
Spoke with patient and advised him that Dr Posey Pronto does not think the dizziness is from myasthenia or medication and that he should contact his PCP.  Patient understands and will follow up.

## 2019-02-17 DIAGNOSIS — R42 Dizziness and giddiness: Secondary | ICD-10-CM | POA: Diagnosis not present

## 2019-02-17 DIAGNOSIS — G7000 Myasthenia gravis without (acute) exacerbation: Secondary | ICD-10-CM | POA: Diagnosis not present

## 2019-02-17 DIAGNOSIS — E1151 Type 2 diabetes mellitus with diabetic peripheral angiopathy without gangrene: Secondary | ICD-10-CM | POA: Diagnosis not present

## 2019-02-17 DIAGNOSIS — I10 Essential (primary) hypertension: Secondary | ICD-10-CM | POA: Diagnosis not present

## 2019-02-20 ENCOUNTER — Telehealth (INDEPENDENT_AMBULATORY_CARE_PROVIDER_SITE_OTHER): Payer: PPO | Admitting: Neurology

## 2019-02-20 ENCOUNTER — Other Ambulatory Visit: Payer: Self-pay

## 2019-02-20 VITALS — Ht 69.0 in | Wt 245.0 lb

## 2019-02-20 DIAGNOSIS — G7000 Myasthenia gravis without (acute) exacerbation: Secondary | ICD-10-CM

## 2019-02-20 NOTE — Progress Notes (Signed)
    Virtual Visit via Telephone Note The purpose of this virtual visit is to provide medical care while limiting exposure to the novel coronavirus.    Consent was obtained for phone visit:  Yes.   Answered questions that patient had about telehealth interaction:  Yes.   I discussed the limitations, risks, security and privacy concerns of performing an evaluation and management service by telephone. I also discussed with the patient that there may be a patient responsible charge related to this service. The patient expressed understanding and agreed to proceed.  Pt location: Home Physician Location: office Name of referring provider:  Leanna Battles, MD I connected with .Joshua [name] at patients initiation/request on 02/20/2019 at  2:10 PM EDT by telephone and verified that I am speaking with the correct person using two identifiers.  Pt MRN:  [id number] Pt DOB:  December 09, 1945   History of Present Illness: This is a 73 year old man returning for follow-up of myasthenia gravis.  He is doing well on CellCept 500 mg twice daily and Mestinon 30 mg twice daily.  He has rare spells of difficulty swallowing, this occurs about once a month and is very short-lived.  He does not have any double vision, droopy eyelids, or arm or leg weakness.  A few weeks ago, he was experiencing vertigo and is doing home exercises for this.  He has had a few stressful months due to his mother passing and he also placed his wife into a nursing facility.  He is living alone at this time.   Assessment and Plan:   1.  Myasthenia gravis without exacerbation manifesting with bulbar symptoms in 2012.  He has been doing well on CellCept 500 mg twice daily and Mestinon 30 mg twice daily. Check CBC and CMP.  Patient has an upcoming appointment with PCP for annual physical and will get labs drawn then.   2.  BPPV, continue home exercises.  If no improvement, refer to vestibular therapy.    Follow Up Instructions:  I discussed  the assessment and treatment plan with the patient. The patient was provided an opportunity to ask questions and all were answered. The patient agreed with the plan and demonstrated an understanding of the instructions.   The patient was advised to call back or seek an in-person evaluation if the symptoms worsen or if the condition fails to improve as anticipated.   Total Time spent in visit with the patient was:  20 min, of which 100% of the time was spent in counseling and/or coordinating care.   Pt understands and agrees with the plan of care outlined.     Alda Berthold, DO

## 2019-03-03 DIAGNOSIS — E538 Deficiency of other specified B group vitamins: Secondary | ICD-10-CM | POA: Diagnosis not present

## 2019-03-05 DIAGNOSIS — E119 Type 2 diabetes mellitus without complications: Secondary | ICD-10-CM | POA: Diagnosis not present

## 2019-03-05 DIAGNOSIS — H2513 Age-related nuclear cataract, bilateral: Secondary | ICD-10-CM | POA: Diagnosis not present

## 2019-03-05 DIAGNOSIS — H353131 Nonexudative age-related macular degeneration, bilateral, early dry stage: Secondary | ICD-10-CM | POA: Diagnosis not present

## 2019-03-05 DIAGNOSIS — H0102A Squamous blepharitis right eye, upper and lower eyelids: Secondary | ICD-10-CM | POA: Diagnosis not present

## 2019-03-05 DIAGNOSIS — Z794 Long term (current) use of insulin: Secondary | ICD-10-CM | POA: Diagnosis not present

## 2019-03-05 DIAGNOSIS — H0102B Squamous blepharitis left eye, upper and lower eyelids: Secondary | ICD-10-CM | POA: Diagnosis not present

## 2019-03-05 DIAGNOSIS — H31093 Other chorioretinal scars, bilateral: Secondary | ICD-10-CM | POA: Diagnosis not present

## 2019-03-05 DIAGNOSIS — H35373 Puckering of macula, bilateral: Secondary | ICD-10-CM | POA: Diagnosis not present

## 2019-03-12 DIAGNOSIS — E11319 Type 2 diabetes mellitus with unspecified diabetic retinopathy without macular edema: Secondary | ICD-10-CM | POA: Diagnosis not present

## 2019-03-12 DIAGNOSIS — E538 Deficiency of other specified B group vitamins: Secondary | ICD-10-CM | POA: Diagnosis not present

## 2019-03-12 DIAGNOSIS — E7849 Other hyperlipidemia: Secondary | ICD-10-CM | POA: Diagnosis not present

## 2019-03-12 DIAGNOSIS — I10 Essential (primary) hypertension: Secondary | ICD-10-CM | POA: Diagnosis not present

## 2019-03-12 DIAGNOSIS — Z125 Encounter for screening for malignant neoplasm of prostate: Secondary | ICD-10-CM | POA: Diagnosis not present

## 2019-03-17 DIAGNOSIS — M545 Low back pain: Secondary | ICD-10-CM | POA: Diagnosis not present

## 2019-03-17 DIAGNOSIS — E785 Hyperlipidemia, unspecified: Secondary | ICD-10-CM | POA: Diagnosis not present

## 2019-03-17 DIAGNOSIS — R42 Dizziness and giddiness: Secondary | ICD-10-CM | POA: Diagnosis not present

## 2019-03-17 DIAGNOSIS — Z0000 Encounter for general adult medical examination without abnormal findings: Secondary | ICD-10-CM | POA: Diagnosis not present

## 2019-03-17 DIAGNOSIS — F17200 Nicotine dependence, unspecified, uncomplicated: Secondary | ICD-10-CM | POA: Diagnosis not present

## 2019-03-17 DIAGNOSIS — C189 Malignant neoplasm of colon, unspecified: Secondary | ICD-10-CM | POA: Diagnosis not present

## 2019-03-17 DIAGNOSIS — E538 Deficiency of other specified B group vitamins: Secondary | ICD-10-CM | POA: Diagnosis not present

## 2019-03-17 DIAGNOSIS — Z794 Long term (current) use of insulin: Secondary | ICD-10-CM | POA: Diagnosis not present

## 2019-03-17 DIAGNOSIS — I10 Essential (primary) hypertension: Secondary | ICD-10-CM | POA: Diagnosis not present

## 2019-03-17 DIAGNOSIS — E1151 Type 2 diabetes mellitus with diabetic peripheral angiopathy without gangrene: Secondary | ICD-10-CM | POA: Diagnosis not present

## 2019-03-17 DIAGNOSIS — G7000 Myasthenia gravis without (acute) exacerbation: Secondary | ICD-10-CM | POA: Diagnosis not present

## 2019-03-23 DIAGNOSIS — H5703 Miosis: Secondary | ICD-10-CM | POA: Diagnosis not present

## 2019-03-23 DIAGNOSIS — H2181 Floppy iris syndrome: Secondary | ICD-10-CM | POA: Diagnosis not present

## 2019-03-23 DIAGNOSIS — H2512 Age-related nuclear cataract, left eye: Secondary | ICD-10-CM | POA: Diagnosis not present

## 2019-03-23 DIAGNOSIS — H21562 Pupillary abnormality, left eye: Secondary | ICD-10-CM | POA: Diagnosis not present

## 2019-04-01 DIAGNOSIS — R82998 Other abnormal findings in urine: Secondary | ICD-10-CM | POA: Diagnosis not present

## 2019-04-01 DIAGNOSIS — I10 Essential (primary) hypertension: Secondary | ICD-10-CM | POA: Diagnosis not present

## 2019-04-02 DIAGNOSIS — H2511 Age-related nuclear cataract, right eye: Secondary | ICD-10-CM | POA: Diagnosis not present

## 2019-04-06 DIAGNOSIS — H2511 Age-related nuclear cataract, right eye: Secondary | ICD-10-CM | POA: Diagnosis not present

## 2019-04-06 DIAGNOSIS — H52221 Regular astigmatism, right eye: Secondary | ICD-10-CM | POA: Diagnosis not present

## 2019-04-10 ENCOUNTER — Ambulatory Visit: Payer: PPO | Admitting: Neurology

## 2019-06-18 DIAGNOSIS — Z23 Encounter for immunization: Secondary | ICD-10-CM | POA: Diagnosis not present

## 2019-06-18 DIAGNOSIS — E538 Deficiency of other specified B group vitamins: Secondary | ICD-10-CM | POA: Diagnosis not present

## 2019-08-17 DIAGNOSIS — D2262 Melanocytic nevi of left upper limb, including shoulder: Secondary | ICD-10-CM | POA: Diagnosis not present

## 2019-08-17 DIAGNOSIS — Z85828 Personal history of other malignant neoplasm of skin: Secondary | ICD-10-CM | POA: Diagnosis not present

## 2019-08-17 DIAGNOSIS — D1801 Hemangioma of skin and subcutaneous tissue: Secondary | ICD-10-CM | POA: Diagnosis not present

## 2019-08-17 DIAGNOSIS — D2272 Melanocytic nevi of left lower limb, including hip: Secondary | ICD-10-CM | POA: Diagnosis not present

## 2019-08-17 DIAGNOSIS — D2271 Melanocytic nevi of right lower limb, including hip: Secondary | ICD-10-CM | POA: Diagnosis not present

## 2019-08-17 DIAGNOSIS — D225 Melanocytic nevi of trunk: Secondary | ICD-10-CM | POA: Diagnosis not present

## 2019-08-17 DIAGNOSIS — D2372 Other benign neoplasm of skin of left lower limb, including hip: Secondary | ICD-10-CM | POA: Diagnosis not present

## 2019-08-17 DIAGNOSIS — D2261 Melanocytic nevi of right upper limb, including shoulder: Secondary | ICD-10-CM | POA: Diagnosis not present

## 2019-08-17 DIAGNOSIS — L218 Other seborrheic dermatitis: Secondary | ICD-10-CM | POA: Diagnosis not present

## 2019-08-17 DIAGNOSIS — D485 Neoplasm of uncertain behavior of skin: Secondary | ICD-10-CM | POA: Diagnosis not present

## 2019-08-17 DIAGNOSIS — L821 Other seborrheic keratosis: Secondary | ICD-10-CM | POA: Diagnosis not present

## 2019-08-17 DIAGNOSIS — B351 Tinea unguium: Secondary | ICD-10-CM | POA: Diagnosis not present

## 2019-08-28 ENCOUNTER — Other Ambulatory Visit: Payer: Self-pay

## 2019-08-28 ENCOUNTER — Encounter: Payer: Self-pay | Admitting: Neurology

## 2019-08-28 ENCOUNTER — Telehealth (INDEPENDENT_AMBULATORY_CARE_PROVIDER_SITE_OTHER): Payer: PPO | Admitting: Neurology

## 2019-08-28 VITALS — Ht 69.0 in | Wt 260.0 lb

## 2019-08-28 DIAGNOSIS — G7000 Myasthenia gravis without (acute) exacerbation: Secondary | ICD-10-CM

## 2019-08-28 DIAGNOSIS — H9193 Unspecified hearing loss, bilateral: Secondary | ICD-10-CM

## 2019-08-28 MED ORDER — MYCOPHENOLATE MOFETIL 500 MG PO TABS: 500.0000 mg | ORAL_TABLET | Freq: Two times a day (BID) | ORAL | 3 refills | Status: DC

## 2019-08-28 MED ORDER — PYRIDOSTIGMINE BROMIDE 60 MG PO TABS: ORAL_TABLET | ORAL | 3 refills | Status: DC

## 2019-08-28 NOTE — Progress Notes (Signed)
   Virtual Visit via Video Note The purpose of this virtual visit is to provide medical care while limiting exposure to the novel coronavirus.    Consent was obtained for video visit:  Yes.   Answered questions that patient had about telehealth interaction:  Yes.   I discussed the limitations, risks, security and privacy concerns of performing an evaluation and management service by telemedicine. I also discussed with the patient that there may be a patient responsible charge related to this service. The patient expressed understanding and agreed to proceed.  Pt location: Home Physician Location: office Name of referring provider:  Leanna Battles, MD I connected with Fredderick Phenix at patients initiation/request on 08/28/2019 at  3:30 PM EST by video enabled telemedicine application and verified that I am speaking with the correct person using two identifiers. Pt MRN:  [id number] Pt DOB:  [birth year] Video Participants:  Fredderick Phenix   History of Present Illness: This is a 73 y.o. male returning for follow-up of myasthenia gravis.  He is doing very well with respect to MG and denies any double vision, ptosis, limb weakness, or difficulty with swallowing/talking.  He complains of bilateral hearing loss and was asking if this is due to MG.  Patient made aware that MG is strictly neuromuscular condition and does not affect hearing.    Observations/Objective:   Vitals:   08/28/19 1514  Weight: 260 lb (117.9 kg)  Height: 5\' 9"  (1.753 m)   Patient is awake, alert, and appears comfortable.  Oriented x 4.   Extraocular muscles are intact. No ptosis.  Face is symmetric.  Speech is not dysarthric. Tongue is midline. Antigravity in all extremities.  No pronator drift. Gait appears normal   Assessment and Plan:  1.  Myasthenia gravis without exacerbation (dx 2012 with bulbar symptoms).  Clinically stable  - Continue Cellcept 500mg  BID  - Continue mestinon 30mg  BID, ok to take extra dose as  needed  - Follow-up with latest CBC and CMP from PCP's office  2.  Bilateral hearing loss, follow-up with PCP for referral to audiology. Discussed that symptoms are not due to myasthenia   Follow Up Instructions:   I discussed the assessment and treatment plan with the patient. The patient was provided an opportunity to ask questions and all were answered. The patient agreed with the plan and demonstrated an understanding of the instructions.   The patient was advised to call back or seek an in-person evaluation if the symptoms worsen or if the condition fails to improve as anticipated.  Follow-up in 1 year  Total time spent:  15 minutes     Alda Berthold, DO

## 2019-11-01 ENCOUNTER — Ambulatory Visit: Payer: Medicare Other | Attending: Internal Medicine

## 2019-11-01 DIAGNOSIS — Z23 Encounter for immunization: Secondary | ICD-10-CM

## 2019-11-01 NOTE — Progress Notes (Signed)
   Covid-19 Vaccination Clinic  Name:  Joshua [name]    MRN: [id number] DOB: [birth year]  11/01/2019  Mr. Joshua [name] was observed post Covid-19 immunization for 15 minutes without incidence. He was provided with Vaccine Information Sheet and instruction to access the V-Safe system.   Mr. Joshua [name] was instructed to call 911 with any severe reactions post vaccine: Marland Kitchen Difficulty breathing  . Swelling of your face and throat  . A fast heartbeat  . A bad rash all over your body  . Dizziness and weakness    Immunizations Administered    Name Date Dose VIS Date Route   Pfizer COVID-19 Vaccine 11/01/2019 12:17 PM 0.3 mL 08/21/2019 Intramuscular   Manufacturer: [place]   Lot: [id number]   [place]: [id number]

## 2019-11-23 ENCOUNTER — Ambulatory Visit: Payer: Medicare Other | Attending: Internal Medicine

## 2019-11-23 DIAGNOSIS — Z23 Encounter for immunization: Secondary | ICD-10-CM

## 2019-11-23 NOTE — Progress Notes (Signed)
   Covid-19 Vaccination Clinic  Name:  LATRAVIUS CULLINAN    MRN: [id number] DOB: Jan 19, 1946  11/23/2019  Mr. Mcnally was observed post Covid-19 immunization for 15 minutes without incident. He was provided with Vaccine Information Sheet and instruction to access the V-Safe system.   Mr. Root was instructed to call 911 with any severe reactions post vaccine: Marland Kitchen Difficulty breathing  . Swelling of face and throat  . A fast heartbeat  . A bad rash all over body  . Dizziness and weakness   Immunizations Administered    Name Date Dose VIS Date Route   Pfizer COVID-19 Vaccine 11/23/2019  4:44 PM 0.3 mL 08/21/2019 Intramuscular   Manufacturer: [place]   Lot: [id number]   [place]: [id number]

## 2019-12-29 ENCOUNTER — Other Ambulatory Visit: Payer: Self-pay

## 2020-05-15 ENCOUNTER — Other Ambulatory Visit: Payer: Self-pay | Admitting: Neurology

## 2020-06-22 ENCOUNTER — Other Ambulatory Visit: Payer: Self-pay | Admitting: Neurology

## 2020-06-22 NOTE — Telephone Encounter (Signed)
Patient has appt in December for follow up, no refills given.

## 2020-07-22 ENCOUNTER — Other Ambulatory Visit: Payer: Self-pay | Admitting: Neurology

## 2020-07-22 NOTE — Telephone Encounter (Signed)
Needs to schedule follow-up for additional refills, last seen in 08/2019.

## 2020-09-05 ENCOUNTER — Encounter: Payer: Self-pay | Admitting: Neurology

## 2020-09-05 ENCOUNTER — Ambulatory Visit: Payer: Medicare Other | Admitting: Neurology

## 2020-09-05 DIAGNOSIS — Z029 Encounter for administrative examinations, unspecified: Secondary | ICD-10-CM

## 2020-10-07 ENCOUNTER — Other Ambulatory Visit: Payer: Self-pay | Admitting: Neurology

## 2020-10-07 ENCOUNTER — Ambulatory Visit: Payer: Medicare Other | Admitting: Neurology

## 2020-10-10 ENCOUNTER — Ambulatory Visit: Payer: Medicare Other | Admitting: Neurology

## 2020-10-20 ENCOUNTER — Other Ambulatory Visit: Payer: Self-pay

## 2020-10-20 ENCOUNTER — Encounter: Payer: Self-pay | Admitting: Neurology

## 2020-10-20 ENCOUNTER — Ambulatory Visit: Payer: Medicare Other | Admitting: Neurology

## 2020-10-20 ENCOUNTER — Other Ambulatory Visit (INDEPENDENT_AMBULATORY_CARE_PROVIDER_SITE_OTHER): Payer: Medicare Other

## 2020-10-20 VITALS — BP 131/80 | HR 112 | Resp 18 | Ht 68.0 in | Wt 266.0 lb

## 2020-10-20 DIAGNOSIS — G7000 Myasthenia gravis without (acute) exacerbation: Secondary | ICD-10-CM

## 2020-10-20 DIAGNOSIS — E538 Deficiency of other specified B group vitamins: Secondary | ICD-10-CM

## 2020-10-20 LAB — COMPREHENSIVE METABOLIC PANEL
ALT: 11 U/L (ref 0–53)
AST: 11 U/L (ref 0–37)
Albumin: 3.8 g/dL (ref 3.5–5.2)
Alkaline Phosphatase: 92 U/L (ref 39–117)
BUN: 21 mg/dL (ref 6–23)
CO2: 29 mEq/L (ref 19–32)
Calcium: 8.8 mg/dL (ref 8.4–10.5)
Chloride: 105 mEq/L (ref 96–112)
Creatinine, Ser: 1.42 mg/dL (ref 0.40–1.50)
GFR: 49.02 mL/min — ABNORMAL LOW (ref >60.00)
Glucose, Bld: 149 mg/dL — ABNORMAL HIGH (ref 70–99)
Potassium: 4.5 mEq/L (ref 3.5–5.1)
Sodium: 141 mEq/L (ref 135–145)
Total Bilirubin: 0.4 mg/dL (ref 0.2–1.2)
Total Protein: 6.2 g/dL (ref 6.0–8.3)

## 2020-10-20 LAB — CBC
HCT: 39.5 % (ref 39.0–52.0)
Hemoglobin: 13.3 g/dL (ref 13.0–17.0)
MCHC: 33.6 g/dL (ref 30.0–36.0)
MCV: 96.2 fl (ref 78.0–100.0)
Platelets: 294 10*3/uL (ref 150.0–400.0)
RBC: 4.11 Mil/uL — ABNORMAL LOW (ref 4.22–5.81)
RDW: 13.7 % (ref 11.5–15.5)
WBC: 9.1 10*3/uL (ref 4.0–10.5)

## 2020-10-20 MED ORDER — MYCOPHENOLATE MOFETIL 500 MG PO TABS
500.0000 mg | ORAL_TABLET | Freq: Two times a day (BID) | ORAL | 3 refills | Status: DC
Start: 2020-10-20 — End: 2021-09-21

## 2020-10-20 NOTE — Patient Instructions (Signed)
You can stop mestinon; if you develop breakthrough weakness, you can resume taking half tablet twice daily  Continue Cellcept 500mg  twice daily  Check labs  Return to clinic in 1 year

## 2020-10-20 NOTE — Progress Notes (Signed)
Follow-up Visit   Date: 10/20/20   Joshua [name] MRN: [id number] DOB: [birth year]   Interim History: Joshua [name] is a 75 y.o. right-handed Caucasian male with insulin-dependent diabetes mellitus, hypertension, hyperlipidemia, adenocarcinoma of the colon s/p right hemicolectomy, and chronic low back pain returning to the clinic for follow-up of myasthenia gravis.  The patient was accompanied to the clinic by self.  History of present illness: He was diagnosed in 2012 after presenting with dysphagia, ptosis, and difficulty with chewing.  He did not have limb weakness or double vision. He was hospitalized and treated with IVIG after which, he was started on prednisone 40mg  and Cellcept. He was seeing Dr. Erling Cruz and then transferred care with Dr. Jannifer Franklin at [facility].  He was previously taking 1000mg  twice daily, which has been slowly tapered.   He has not been hospitalized since his diagnosis.  He is currently taking mestinon 30mg  twice daily Cellcept 500mg  twice daily.  He has mild right ptosis, no double vision, difficulty swallowing/talking, or limb weakness.  He has not had an exacerbation since 2012 with his initial diagnosis.  He does not know if he is antibody positive.   UPDATE 10/20/2020: He is here for follow-up visit.  Myasthenia remains under very good control and he denies double vision, droopiness of the eyelids, difficulty swallowing/talking, or limb weakness.  Sometimes, he has excess saliva, but othe rtimes he reports dry mouth. There is no pattern.  He has skipped is mestinon on occasion and denies any breakthrough symptoms.   Medications:  Current Outpatient Medications on File Prior to Visit  Medication Sig Dispense Refill  . atorvastatin (LIPITOR) 40 MG tablet Take 40 mg by mouth daily.    . B-D ULTRAFINE III SHORT PEN 31G X 8 MM MISC 200 each by Other route 3 (three) times daily between meals.    . busPIRone (BUSPAR) 7.5 MG tablet TAKE 1 TABLET BY MOUTH TWICE DAILY 180 tablet 1   . cetirizine-pseudoephedrine (ZYRTEC-D) 5-120 MG tablet Take 1 tablet by mouth daily as needed for allergies.    . cyanocobalamin (,VITAMIN B-12,) 1000 MCG/ML injection Inject 1,000 mcg into the muscle once. Once monthly    . famotidine (PEPCID) 20 MG tablet Take 20 mg by mouth 2 (two) times daily.    . fluticasone (FLONASE) 50 MCG/ACT nasal spray Place 1 spray into both nostrils 2 (two) times a day.    . gabapentin (NEURONTIN) 300 MG capsule Take 2 capsules daily by mouth.  3  . insulin aspart protamine-insulin aspart (NOVOLOG 70/30) (70-30) 100 UNIT/ML injection Inject 50 Units into the skin 2 (two) times daily with a meal. 42 units with breakfast and before bed.    . INVOKANA 300 MG TABS Take 1 tablet by mouth daily.    Marland Kitchen ketoconazole (NIZORAL) 2 % shampoo APPLY 1 APPLICATION TO THE SCALP D  5  . lisinopril (PRINIVIL,ZESTRIL) 5 MG tablet Take 5 mg by mouth daily.    . metFORMIN (GLUCOPHAGE) 1000 MG tablet Take 1,000 mg by mouth at bedtime.    . Multiple Vitamins-Minerals (OCUVITE PO) Take 1 tablet by mouth daily.    . ONE TOUCH ULTRA TEST test strip 200 each by Other route 3 (three) times daily.    Glory Rosebush DELICA LANCETS 08Q MISC 100 each by Other route 3 (three) times daily.    Marland Kitchen pyridostigmine (MESTINON) 60 MG tablet TAKE ONE-HALF TABLET BY  MOUTH TWICE DAILY OK TO  TAKE AN EXTRA DOSE AS  NEEDED  200 tablet 0  . traMADol (ULTRAM) 50 MG tablet Take 100 mg by mouth 3 (three) times daily.     No current facility-administered medications on file prior to visit.    Allergies:  Allergies  Allergen Reactions  . Aluminum Chlorohydrate Rash  . Niacin And Related Rash    Vital Signs:  BP 131/80   Pulse (!) 112   Resp 18   Ht 5\' 8"  (1.727 m)   Wt 266 lb (120.7 kg)   SpO2 95%   BMI 40.45 kg/m    Neurological Exam: MENTAL STATUS including orientation to time, place, person, recent and remote memory, attention span and concentration, language, and fund of knowledge is normal.  Speech  is not dysarthric.  CRANIAL NERVES:  No visual field defects.  Pupils equal round and reactive to light.  Normal conjugate, extra-ocular eye movements in all directions of gaze.  No ptosis.  Face is symmetric. Facial muscles are 5/5   MOTOR:  Motor strength is 5/5 in all extremities.  No atrophy, fasciculations or abnormal movements.  No pronator drift.  Tone is normal.    COORDINATION/GAIT:  Antalgic gait due to back pain, unassisted and stable.  Data: n/a  IMPRESSION/PLAN: 1.  Myasthenia gravis without exacerbation (dx 2012 with bulbar symptoms), clinically stable with no disease manifestation  - Continue Cellcept 500mg  BID  - OK to stop mestinon since he is on a very low dose and denies wearing off symptoms  - Check CBC and CMP  2.  Vitamin B12 deficiency  - Completed injections  - Start OTC vitamin B12 10101mcg daily  Return to clinic in 1 year.   Thank you for allowing me to participate in patient's care.  If I can answer any additional questions, I would be pleased to do so.    Sincerely,    Donika K. Posey Pronto, DO

## 2020-10-21 ENCOUNTER — Telehealth: Payer: Self-pay

## 2020-10-21 NOTE — Telephone Encounter (Signed)
-----   Message from Alda Berthold, DO sent at 10/21/2020  1:41 PM EST ----- Please inform patient that his labs overall look good, but his renal function is mildly abnormal.  Please fax the results to his PCP so they can follow-up on this. Thanks.

## 2020-10-21 NOTE — Telephone Encounter (Signed)
Called patient and left a message for a call back.  

## 2020-10-25 NOTE — Telephone Encounter (Signed)
-----   Message from Alda Berthold, DO sent at 10/21/2020  1:41 PM EST ----- Please inform patient that his labs overall look good, but his renal function is mildly abnormal.  Please fax the results to his PCP so they can follow-up on this. Thanks.

## 2020-10-25 NOTE — Telephone Encounter (Signed)
Called patient and informed him of labs and also of abnormal renal function. Informed patient that I would send labs to pcp and that he will need to f/u with them. Patient verbalized understanding and had no questions or concerns.   Labs have been faxed to patients pcp Dr. Philip Aspen to fax #: [phone]

## 2020-12-08 ENCOUNTER — Other Ambulatory Visit: Payer: Self-pay | Admitting: Neurology

## 2021-01-02 ENCOUNTER — Ambulatory Visit (INDEPENDENT_AMBULATORY_CARE_PROVIDER_SITE_OTHER): Payer: Medicare Other | Admitting: Podiatry

## 2021-01-02 ENCOUNTER — Other Ambulatory Visit: Payer: Self-pay

## 2021-01-02 DIAGNOSIS — M79675 Pain in left toe(s): Secondary | ICD-10-CM | POA: Diagnosis not present

## 2021-01-02 DIAGNOSIS — B351 Tinea unguium: Secondary | ICD-10-CM | POA: Diagnosis not present

## 2021-01-02 DIAGNOSIS — E0843 Diabetes mellitus due to underlying condition with diabetic autonomic (poly)neuropathy: Secondary | ICD-10-CM | POA: Diagnosis not present

## 2021-01-02 DIAGNOSIS — M79674 Pain in right toe(s): Secondary | ICD-10-CM

## 2021-01-02 NOTE — Progress Notes (Signed)
   SUBJECTIVE Patient with a history of diabetes mellitus presents to office today complaining of elongated, thickened nails that cause pain while ambulating in shoes.  He is unable to trim his own nails. Patient is here for further evaluation and treatment.   Past Medical History:  Diagnosis Date  . Anemia   . Anxiety   . Arthritis   . Basal cell carcinoma   . Carpal tunnel syndrome, bilateral   . Chronic low back pain 12/07/2014  . Colon cancer ([place]) 2006  . Colon polyps   . Diabetes ([place])   . Diverticulosis   . DJD (degenerative joint disease)   . GERD (gastroesophageal reflux disease)   . Hemorrhoids   . Hyperlipidemia   . Hypertension   . Morbid obesity ([place])   . Myasthenia gravis ([place])   . Myasthenia gravis without exacerbation ([place]) 05/19/2013  . Vitamin B12 deficiency     OBJECTIVE General Patient is awake, alert, and oriented x 3 and in no acute distress. Derm Skin is dry and supple bilateral. Negative open lesions or macerations. Remaining integument unremarkable. Nails are tender, long, thickened and dystrophic with subungual debris, consistent with onychomycosis, 1-5 bilateral. No signs of infection noted. Vasc  DP and PT pedal pulses palpable bilaterally. Temperature gradient within normal limits.  Neuro Epicritic and protective threshold sensation diminished bilaterally.  Musculoskeletal Exam No symptomatic pedal deformities noted bilateral. Muscular strength within normal limits.  ASSESSMENT 1. Diabetes Mellitus w/ peripheral neuropathy 2. Onychomycosis of nail due to dermatophyte bilateral 3. Pain in foot bilateral  PLAN OF CARE 1. Patient evaluated today. 2. Instructed to maintain good pedal hygiene and foot care. Stressed importance of controlling blood sugar.  3. Mechanical debridement of nails 1-5 bilaterally performed using a nail nipper. Filed with dremel without incident.  4. Return to clinic in 3 mos.     Edrick Kins, DPM [facility]  Dr. Edrick Kins, [address]                                        [place], [place] [address]                Office [phone]  Fax [phone]

## 2021-04-03 ENCOUNTER — Ambulatory Visit: Payer: Medicare Other | Admitting: Podiatry

## 2021-04-03 ENCOUNTER — Encounter: Payer: Self-pay | Admitting: Podiatry

## 2021-04-03 ENCOUNTER — Other Ambulatory Visit: Payer: Self-pay

## 2021-04-03 DIAGNOSIS — B351 Tinea unguium: Secondary | ICD-10-CM

## 2021-04-03 DIAGNOSIS — M79675 Pain in left toe(s): Secondary | ICD-10-CM | POA: Diagnosis not present

## 2021-04-03 DIAGNOSIS — E0843 Diabetes mellitus due to underlying condition with diabetic autonomic (poly)neuropathy: Secondary | ICD-10-CM

## 2021-04-03 DIAGNOSIS — M79674 Pain in right toe(s): Secondary | ICD-10-CM

## 2021-04-03 NOTE — Progress Notes (Signed)
This patient returns to my office for at risk foot care.  This patient requires this care by a professional since this patient will be at risk due to having  diabetes.  This patient is unable to cut nails himself since the patient cannot reach his nails.These nails are painful walking and wearing shoes.  This patient presents for at risk foot care today.  General Appearance  Alert, conversant and in no acute stress.  Vascular  Dorsalis pedis and posterior tibial  pulses are palpable  bilaterally.  Capillary return is within normal limits  bilaterally. Temperature is within normal limits  bilaterally.  Neurologic  Senn-Weinstein monofilament wire test within normal limits  bilaterally. Muscle power within normal limits bilaterally.  Nails Thick disfigured discolored nails with subungual debris  from hallux to fifth toes bilaterally. No evidence of bacterial infection or drainage bilaterally.  Orthopedic  No limitations of motion  feet .  No crepitus or effusions noted.  No bony pathology or digital deformities noted.  Skin  normotropic skin with no porokeratosis noted bilaterally.  No signs of infections or ulcers noted.     Onychomycosis  Pain in right toes  Pain in left toes  Consent was obtained for treatment procedures.   Mechanical debridement of nails 1-5  bilaterally performed with a nail nipper.  Filed with dremel without incident.    Return office visit   3 months                   Told patient to return for periodic foot care and evaluation due to potential at risk complications.   Zayne Draheim DPM   

## 2021-04-07 ENCOUNTER — Telehealth: Payer: Self-pay | Admitting: Physical Medicine and Rehabilitation

## 2021-04-07 NOTE — Telephone Encounter (Signed)
Pt returned call for courtney to schedule for Dr. Ernestina Patches

## 2021-04-07 NOTE — Telephone Encounter (Signed)
Received referral through [facility] from Dr. Philip Aspen. Left message #1 to schedule OV.

## 2021-04-10 NOTE — Telephone Encounter (Signed)
Scheduled for 8/3 at 0830.

## 2021-04-12 ENCOUNTER — Other Ambulatory Visit: Payer: Self-pay

## 2021-04-12 ENCOUNTER — Encounter: Payer: Self-pay | Admitting: Physical Medicine and Rehabilitation

## 2021-04-12 ENCOUNTER — Ambulatory Visit: Payer: Medicare Other | Admitting: Physical Medicine and Rehabilitation

## 2021-04-12 VITALS — BP 126/76 | HR 80

## 2021-04-12 DIAGNOSIS — M48061 Spinal stenosis, lumbar region without neurogenic claudication: Secondary | ICD-10-CM | POA: Diagnosis not present

## 2021-04-12 DIAGNOSIS — M47816 Spondylosis without myelopathy or radiculopathy, lumbar region: Secondary | ICD-10-CM | POA: Diagnosis not present

## 2021-04-12 DIAGNOSIS — M47819 Spondylosis without myelopathy or radiculopathy, site unspecified: Secondary | ICD-10-CM | POA: Diagnosis not present

## 2021-04-12 DIAGNOSIS — M961 Postlaminectomy syndrome, not elsewhere classified: Secondary | ICD-10-CM | POA: Diagnosis not present

## 2021-04-12 NOTE — Progress Notes (Signed)
Pt state lower back pain. Pt state laying, walking and sitting makes the pain worse. Pt state uses ice and pain meds to help ease his pain.   Numeric Pain Rating Scale and Functional Assessment Average Pain 10 Pain Right Now 4 My pain is constant, sharp, dull, stabbing, tingling, and aching Pain is worse with: bending, sitting, and some activites Pain improves with: heat/ice and medication   In the last MONTH (on 0-10 scale) has pain interfered with the following?  1. General activity like being  able to carry out your everyday physical activities such as walking, climbing stairs, carrying groceries, or moving a chair?  Rating(7)  2. Relation with others like being able to carry out your usual social activities and roles such as  activities at home, at work and in your community. Rating(7)  3. Enjoyment of life such that you have  been bothered by emotional problems such as feeling anxious, depressed or irritable?  Rating(7)

## 2021-04-12 NOTE — Progress Notes (Deleted)
Joshua [name] - 75 y.o. male MRN [id number]  Date of birth: Oct 11, 1945  Office Visit Note: Visit Date: 04/12/2021 PCP: Leanna Battles, MD Referred by: Leanna Battles, MD  Subjective: Chief Complaint  Patient presents with   Lower Back - Pain   HPI: Joshua [name] is a 75 y.o. male who comes in today HPI ROS Otherwise per HPI.  Assessment & Plan: Visit Diagnoses:    ICD-10-CM   1. Spondylosis without myelopathy or radiculopathy  M47.819     2. Facet arthropathy, lumbar  M47.816     3. Post laminectomy syndrome  M96.1        Plan: No additional findings.   Meds & Orders: No orders of the defined types were placed in this encounter.  No orders of the defined types were placed in this encounter.   Follow-up: No follow-ups on file.   Procedures: No procedures performed      Clinical History: MR L SPINE W/O dated 10/18/2007   FINDINGS: Congenitally narrow spinal canal is present with superimposed multilevel degenerative disease. There is bunching of the nerve roots of the cauda equina associated with severe spinal stenosis. AP diameter of the bony spinal canal at L3 and L4 is about 11 mm. The spinal cord terminates posterior to the L1 vertebra. Grade I anterolisthesis of L4 on L5 appears facet mediated and measures 2 mm. Dextroconvex curve of the lumbar spine is present with the apex at L3. Vertebral body height is preserved. Marrow signal is within normal limits. Unchanged small sclerotic lesion in the T12 vertebra likely represents a bone island.   L1-L2: Disc desiccation with shallow circumferential bulging. Mild central stenosis is present. No definite neural compression. Mild symmetric foraminal stenosis associated with short pedicles.   L2-L3: Disc desiccation. Central stenosis is due to combination of congenital stenosis and posterior ligamentum flavum redundancy. Mild disc bulging is present. Mild symmetric foraminal stenosis associated with short  pedicles.   L3-L4: Severe progressive central stenosis is multifactorial. Disc desiccation with broad-based bulging and more pronounced posterior ligamentum flavum redundancy. Obliteration of both lateral recesses with compression of the descending L4 nerves. Mild symmetric foraminal stenosis associated with short pedicles.   L4-L5: Severe central stenosis with bilateral lateral recess stenosis. This is multifactorial, associated with anterolisthesis, uncoverage of the disc and posterior ligamentum flavum redundancy along with facet hypertrophy. There is mild bilateral foraminal stenosis which is multifactorial. No interval change compared to prior.   L5-S1: Disc desiccation. Central canal and lateral recesses are patent. Both neural foramina appear adequately patent.   IMPRESSION: Progressive lumbar spondylosis with multilevel central stenosis due to combination of congenital spinal stenosis and superimposed degenerative disease. Central stenosis is most pronounced at L3-L4 and L4-L5, severe at both levels on today's exam.     Electronically Signed By: Dereck Ligas M.D. On: 11/07/2013 17:09   He reports that he has been smoking cigars. He has never used smokeless tobacco. No results for input(s): HGBA1C, LABURIC in the last 8760 hours.  Objective:  VS:  HT:    WT:   BMI:     BP:126/76  HR:80bpm  TEMP: ( )  RESP:  Physical Exam  Ortho Exam  Imaging: No results found.  Past Medical/Family/Surgical/Social History: Medications & Allergies reviewed per EMR, new medications updated. Patient Active Problem List   Diagnosis Date Noted   Diabetic macular edema, left eye ([place]) 07/12/2015   Macular puckering of retina, bilateral 07/12/2015   Moderate nonproliferative diabetic retinopathy of both eyes  without macular edema associated with type 2 diabetes mellitus ([place]) 07/12/2015   Senile nuclear sclerosis, bilateral 07/12/2015   Hypertensive retinopathy of both eyes  07/12/2015   Lumbar stenosis with neurogenic claudication 05/02/2015   Chronic low back pain 12/07/2014   Epiretinal membrane, left eye 10/20/2013   Myasthenia gravis without exacerbation ([place]) 05/19/2013   Carpal tunnel syndrome 05/13/2013   Vitamin B 12 deficiency 05/13/2013   Myasthenia gravis with exacerbation, adult form ([place]) 05/13/2013   Unspecified ptosis of eyelid 05/13/2013   Dysphonia 05/13/2013   Shortness of breath 05/13/2013   GERD (gastroesophageal reflux disease) 05/13/2013   Nonproliferative diabetic retinopathy ([place]) 09/18/2011   HEMORRHOIDS-INTERNAL 02/22/2009   RECTAL BLEEDING 02/22/2009   PERSONAL HX COLON CANCER 02/22/2009   ADENOCARCINOMA, COLON 02/21/2009   DM 02/21/2009   HYPERLIPIDEMIA 02/21/2009   HEMORRHOIDS 02/21/2009   DIVERTICULOSIS, COLON 02/21/2009   HYPERTENSION NEC 02/21/2009   Past Medical History:  Diagnosis Date   Anemia    Anxiety    Arthritis    Basal cell carcinoma    Carpal tunnel syndrome, bilateral    Chronic low back pain 12/07/2014   Colon cancer ([place]) 2006   Colon polyps    Diabetes ([place])    Diverticulosis    DJD (degenerative joint disease)    GERD (gastroesophageal reflux disease)    Hemorrhoids    Hyperlipidemia    Hypertension    Morbid obesity ([place])    Myasthenia gravis ([place])    Myasthenia gravis without exacerbation ([place]) 05/19/2013   Vitamin B12 deficiency    Family History  Problem Relation Age of Onset   Heart disease Mother    Prostate cancer Maternal Grandfather    Colon polyps Maternal Grandfather    Colon cancer Neg Hx    Past Surgical History:  Procedure Laterality Date   ANKLE ARTHROPLASTY Right 1969   BASAL CELL CARCINOMA EXCISION     face   CARPAL TUNNEL WITH CUBITAL TUNNEL Left 06/02/2013   Procedure: CARPAL TUNNEL WITH CUBITAL TUNNEL;  Surgeon: Tennis Must, MD;  Location: [place];  Service: Orthopedics;  Laterality: Left;   CARPAL TUNNEL WITH CUBITAL TUNNEL Right 07/14/2013    Procedure: RIGHT CARPAL TUNNEL AND CUBITAL TUNNEL RELEASE;  Surgeon: Tennis Must, MD;  Location: [place];  Service: Orthopedics;  Laterality: Right;   COLONOSCOPY     LUMBAR LAMINECTOMY/DECOMPRESSION MICRODISCECTOMY N/A 05/02/2015   Procedure: LUMBAR LAMINECTOMY/DECOMPRESSION MICRODISCECTOMY 2 LEVELS;  Surgeon: Jovita Gamma, MD;  Location: [place];  Service: Neurosurgery;  Laterality: N/A;  L3 L4 L5 laminectomies   PATELLA FRACTURE SURGERY Left 2011   RIGHT COLECTOMY  2006   TONSILLECTOMY  1968   Social History   Occupational History   Occupation: Retired Firefighter: [employer]  Tobacco Use   Smoking status: Every Day    Types: Cigars   Smokeless tobacco: Never  Vaping Use   Vaping Use: Never used  Substance and Sexual Activity   Alcohol use: Yes    Comment: 6 pack a year   Drug use: No   Sexual activity: Not on file

## 2021-04-12 NOTE — Progress Notes (Signed)
Joshua [name] - 75 y.o. male MRN [id number]  Date of birth: 15-Oct-1945  Office Visit Note: Visit Date: 04/12/2021 PCP: Joshua Battles, MD Referred by: Joshua Battles, MD  Subjective: Chief Complaint  Patient presents with   Lower Back - Pain   HPI: Joshua [name] is a 75 y.o. male who comes in today per the request of Dr. Leanna [name] for evaluation of chronic, worsening and severe bilateral lower back pain. Patient reports pain has been chronic for several years, but has worsened over the past several months. Patient reports pain is exacerbated with activity and walking, describes as soreness sensation, currently rates as 4 out of 10. Patient reports some pain relief with ice/heat and use of medications. Patient had L3, L4, and L5 decompressive laminectomy performed by Dr. Jovita Gamma in [id number] without complications. Patient previous lumbar MRI from 2015 exhibits multi-level facet hypertrophy and severe central stenosis noted at L3-L4 and L4-L5. Patient attended physical therapy in 2012 at [facility] and reports some relief of pain. Patient had bilateral L3-L4 and L4-L5 medial branch facet blocks in 2014 with Dr. Magnus Sinning and reports significant relief of pain. Patient states he is trying to stay active at home, but finds it difficult due to severe pain. Patient is currently followed by Dr. Narda [name] at [facility] for myasthenia gravis and reports he recently saw her and is doing well. Patient denies focal weakness, numbness and tingling. Patient denies recent falls or trauma.   Review of Systems  Musculoskeletal:  Positive for back pain.  Neurological:  Negative for tingling and focal weakness.  All other systems reviewed and are negative. Otherwise per HPI.  Assessment & Plan: Visit Diagnoses:    ICD-10-CM   1. Spondylosis without myelopathy or radiculopathy  M47.819 MR LUMBAR SPINE WO CONTRAST    2. Facet arthropathy, lumbar  M47.816  MR LUMBAR SPINE WO CONTRAST    3. Spinal stenosis of lumbar region without neurogenic claudication  M48.061     4. Post laminectomy syndrome  M96.1 MR LUMBAR SPINE WO CONTRAST       Plan: Findings:  Chronic, worsening and severe bilateral axial lower back pain. Patient's imaging and clinical presentation are consistent with facet mediated pain. We discussed plan of care with patient in detail and we believe the next step is to obtain lumbar MRI. Patient reports anxiety and issues with claustrophobia. He is requesting an open MRI machine and use of IV sedation. We are happy to accommodate this for him and will include this information in the order. We will have patient follow-up after MRI has been completed to speak with him about treatment options. We would consider diagnostic medial branch facet blocks and possibly more sessions with physical therapy if patient is able to attend. We also discussed radiofrequency ablation with patient and will follow-up on this in the future if appropriate. No red flag symptoms noted.    Meds & Orders: No orders of the defined types were placed in this encounter.   Orders Placed This Encounter  Procedures   MR LUMBAR SPINE WO CONTRAST     Follow-up: Return in about 2 weeks (around 04/26/2021) for Follow-up in office after completion of lumbar MRI.Marland Kitchen   Procedures: No procedures performed      Clinical History: MR L SPINE W/O dated 10/18/2007   FINDINGS: Congenitally narrow spinal canal is present with superimposed multilevel degenerative disease. There is bunching of the nerve roots of the cauda equina associated with  severe spinal stenosis. AP diameter of the bony spinal canal at L3 and L4 is about 11 mm. The spinal cord terminates posterior to the L1 vertebra. Grade I anterolisthesis of L4 on L5 appears facet mediated and measures 2 mm. Dextroconvex curve of the lumbar spine is present with the apex at L3. Vertebral body height is preserved. Marrow  signal is within normal limits. Unchanged small sclerotic lesion in the T12 vertebra likely represents a bone island.   L1-L2: Disc desiccation with shallow circumferential bulging. Mild central stenosis is present. No definite neural compression. Mild symmetric foraminal stenosis associated with short pedicles.   L2-L3: Disc desiccation. Central stenosis is due to combination of congenital stenosis and posterior ligamentum flavum redundancy. Mild disc bulging is present. Mild symmetric foraminal stenosis associated with short pedicles.   L3-L4: Severe progressive central stenosis is multifactorial. Disc desiccation with broad-based bulging and more pronounced posterior ligamentum flavum redundancy. Obliteration of both lateral recesses with compression of the descending L4 nerves. Mild symmetric foraminal stenosis associated with short pedicles.   L4-L5: Severe central stenosis with bilateral lateral recess stenosis. This is multifactorial, associated with anterolisthesis, uncoverage of the disc and posterior ligamentum flavum redundancy along with facet hypertrophy. There is mild bilateral foraminal stenosis which is multifactorial. No interval change compared to prior.   L5-S1: Disc desiccation. Central canal and lateral recesses are patent. Both neural foramina appear adequately patent.   IMPRESSION: Progressive lumbar spondylosis with multilevel central stenosis due to combination of congenital spinal stenosis and superimposed degenerative disease. Central stenosis is most pronounced at L3-L4 and L4-L5, severe at both levels on today's exam.     Electronically Signed By: Dereck Ligas M.D. On: 11/07/2013 17:09   He reports that he has been smoking cigars. He has never used smokeless tobacco. No results for input(s): HGBA1C, LABURIC in the last 8760 hours.  Objective:  VS:  HT:    WT:   BMI:     BP:126/76  HR:80bpm  TEMP: ( )  RESP:  Physical Exam HENT:      Head: Normocephalic and atraumatic.     Right Ear: Tympanic membrane normal.     Left Ear: Tympanic membrane normal.     Nose: Nose normal.     Mouth/Throat:     Mouth: Mucous membranes are moist.  Eyes:     Pupils: Pupils are equal, round, and reactive to light.  Cardiovascular:     Rate and Rhythm: Normal rate.     Pulses: Normal pulses.  Pulmonary:     Effort: Pulmonary effort is normal.  Abdominal:     General: Abdomen is flat. There is no distension.  Musculoskeletal:     Cervical back: Normal range of motion and neck supple.     Comments: Pt is slow to rise from seated position to standing without difficulty. Pain noted upon facet loading. Strong distal strength without clonus, no pain upon palpation of greater trochanters. Sensation intact bilaterally. Walks independently, gait slow.    Skin:    General: Skin is warm and dry.     Capillary Refill: Capillary refill takes less than 2 seconds.  Neurological:     General: No focal deficit present.     Mental Status: He is alert.  Psychiatric:        Mood and Affect: Mood normal.    Ortho Exam  Imaging: No results found.  Past Medical/Family/Surgical/Social History: Medications & Allergies reviewed per EMR, new medications updated. 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 edema associated with type 2 diabetes mellitus ([place]) 07/12/2015   Senile nuclear sclerosis, bilateral 07/12/2015   Hypertensive retinopathy of both eyes 07/12/2015   Lumbar stenosis with neurogenic claudication 05/02/2015   Chronic low back pain 12/07/2014   Epiretinal membrane, left eye 10/20/2013   Myasthenia gravis without exacerbation ([place]) 05/19/2013   Carpal tunnel syndrome 05/13/2013   Vitamin B 12 deficiency 05/13/2013   Myasthenia gravis with  exacerbation, adult form ([place]) 05/13/2013   Unspecified ptosis of eyelid 05/13/2013   Dysphonia 05/13/2013   Shortness of breath 05/13/2013   GERD (gastroesophageal reflux disease) 05/13/2013   Nonproliferative diabetic retinopathy ([place]) 09/18/2011   HEMORRHOIDS-INTERNAL 02/22/2009   RECTAL BLEEDING 02/22/2009   PERSONAL HX COLON CANCER 02/22/2009   ADENOCARCINOMA, COLON 02/21/2009   DM 02/21/2009   HYPERLIPIDEMIA 02/21/2009   HEMORRHOIDS 02/21/2009   DIVERTICULOSIS, COLON 02/21/2009   HYPERTENSION NEC 02/21/2009   Past Medical History:  Diagnosis Date   Anemia    Anxiety    Arthritis    Basal cell carcinoma    Carpal tunnel syndrome, bilateral    Chronic low back pain 12/07/2014   Colon cancer ([place]) 2006   Colon polyps    Diabetes ([place])    Diverticulosis    DJD (degenerative joint disease)    GERD (gastroesophageal reflux disease)    Hemorrhoids    Hyperlipidemia    Hypertension    Morbid obesity ([place])    Myasthenia gravis ([place])    Myasthenia gravis without exacerbation ([place]) 05/19/2013   Vitamin B12 deficiency    Family History  Problem Relation Age of Onset   Heart disease Mother    Prostate cancer Maternal Grandfather    Colon polyps Maternal Grandfather    Colon cancer Neg Hx    Past Surgical History:  Procedure Laterality Date   ANKLE ARTHROPLASTY Right 1969   BASAL CELL CARCINOMA EXCISION     face   CARPAL TUNNEL WITH CUBITAL TUNNEL Left 06/02/2013   Procedure: CARPAL TUNNEL WITH CUBITAL TUNNEL;  Surgeon: Tennis Must, MD;  Location: [place];  Service: Orthopedics;  Laterality: Left;   CARPAL TUNNEL WITH CUBITAL TUNNEL Right 07/14/2013   Procedure: RIGHT CARPAL TUNNEL AND CUBITAL TUNNEL RELEASE;  Surgeon: Tennis Must, MD;  Location: [place];  Service: Orthopedics;  Laterality: Right;   COLONOSCOPY     LUMBAR LAMINECTOMY/DECOMPRESSION MICRODISCECTOMY N/A 05/02/2015   Procedure: LUMBAR LAMINECTOMY/DECOMPRESSION  MICRODISCECTOMY 2 LEVELS;  Surgeon: Jovita Gamma, MD;  Location: [place] [facility];  Service: Neurosurgery;  Laterality: N/A;  L3 L4 L5 laminectomies   PATELLA FRACTURE SURGERY Left 2011   RIGHT COLECTOMY  2006   TONSILLECTOMY  1968   Social History   Occupational History   Occupation: Retired Firefighter: [employer]  Tobacco Use   Smoking status: Every Day    Types: Cigars   Smokeless tobacco: Never  Vaping Use   Vaping Use: Never used  Substance and Sexual Activity   Alcohol use: Yes    Comment: 6 pack a year   Drug use: No   Sexual activity: Not on file

## 2021-05-18 ENCOUNTER — Ambulatory Visit (HOSPITAL_COMMUNITY): Payer: Medicare Other

## 2021-06-02 ENCOUNTER — Telehealth: Payer: Self-pay | Admitting: Physical Medicine and Rehabilitation

## 2021-06-02 NOTE — Telephone Encounter (Signed)
Joshua [name] with pre-admission testing called in regards to an MRI that was sent in. She states the Joshua [name] needed to be seen again before they could get him scheduled and would like a CB so she can inform us what needs to be done.  [phone]

## 2021-06-02 NOTE — Telephone Encounter (Signed)
Joshua [name] me stating "The MRI with anesthesia is scheduled for Tuesday, 06/06/21. Pt needs to be seen by Dr. Ernestina Patches prior to the procedure and do a history and physical that has been done with in the last 30 days prior to the procedure.  This is required by the hospital/anesthesiologist."

## 2021-06-02 NOTE — Progress Notes (Signed)
Pt is scheduled for a MRI with anesthesia on Tuesday, 06/06/21. Pt does not have a recent H&P (done in the last 30 days) in Epic or Care Everywhere. I called Dr. Romona Curls office this morning and spoke with Mia and she would have someone call me back. Around noon I messaged Mia since I had not heard anything from the office trying to explain better what we needed. She responded telling me she had sent another message "to them". I still have not heard from anyone.

## 2021-06-05 ENCOUNTER — Other Ambulatory Visit: Payer: Self-pay | Admitting: Physical Medicine and Rehabilitation

## 2021-06-05 ENCOUNTER — Telehealth: Payer: Self-pay

## 2021-06-05 MED ORDER — DIAZEPAM 5 MG PO TABS: ORAL_TABLET | ORAL | 0 refills | Status: DC

## 2021-06-05 NOTE — Telephone Encounter (Signed)
Pt req Rx for his MRI appt on 9/27.

## 2021-06-05 NOTE — Progress Notes (Signed)
Spoke with Joshua [name] from Dr. Romona Curls office (she returned my call from earlier). I explained to her that pt can't have the MRI with anesthesia since the most recent H&P was 04/12/21. The office was notified of this on Friday and did not get pt in to see the doctor. This is a requirement of the hospital.

## 2021-06-05 NOTE — Progress Notes (Signed)
Sheena (?spelling) called at 2:45 PM and stated that Mr. Homan's MRI would need to be cancelled because they could not get him in for an appt. I told her she needed to call the MRI department and cancel it with them. I gave her the number to MRI.  I told her that I can let the OR desk know that pt will not be coming. As of 3:47 the MRI has not been cancelled.

## 2021-06-05 NOTE — Progress Notes (Signed)
I called Dr. Romona Curls office today since we still don't have a H&P for this patient. I left a message with Gabriel Cirri, scheduler for MRI's and asked that she call me back.

## 2021-06-06 ENCOUNTER — Other Ambulatory Visit: Payer: Self-pay

## 2021-06-06 ENCOUNTER — Ambulatory Visit (HOSPITAL_COMMUNITY)
Admission: RE | Admit: 2021-06-06 | Discharge: 2021-06-06 | Disposition: A | Discharge status: Home / Self Care | Payer: Medicare Other | Source: Ambulatory Visit | Attending: Physical Medicine and Rehabilitation | Admitting: Physical Medicine and Rehabilitation

## 2021-06-06 ENCOUNTER — Encounter (HOSPITAL_COMMUNITY): Admission: RE | Payer: Self-pay | Source: Home / Self Care

## 2021-06-06 ENCOUNTER — Ambulatory Visit (HOSPITAL_COMMUNITY): Admission: RE | Admit: 2021-06-06 | Payer: Medicare Other | Source: Home / Self Care

## 2021-06-06 DIAGNOSIS — M47819 Spondylosis without myelopathy or radiculopathy, site unspecified: Secondary | ICD-10-CM | POA: Diagnosis not present

## 2021-06-06 DIAGNOSIS — M47816 Spondylosis without myelopathy or radiculopathy, lumbar region: Secondary | ICD-10-CM | POA: Diagnosis present

## 2021-06-06 DIAGNOSIS — M961 Postlaminectomy syndrome, not elsewhere classified: Secondary | ICD-10-CM | POA: Insufficient documentation

## 2021-06-06 SURGERY — MRI WITH ANESTHESIA
Anesthesia: General

## 2021-06-09 ENCOUNTER — Telehealth: Payer: Self-pay | Admitting: Physical Medicine and Rehabilitation

## 2021-06-09 NOTE — Telephone Encounter (Signed)
Called patient to schedule OV for MRI review. No answer and voicemail full.

## 2021-06-09 NOTE — Telephone Encounter (Signed)
-----   Message from Magnus Sinning, MD sent at 06/07/2021  4:00 PM EDT ----- Regarding: MRI review and gamplan treatment Severe stenosis above his prior laminectomy

## 2021-06-12 ENCOUNTER — Telehealth: Payer: Self-pay | Admitting: Physical Medicine and Rehabilitation

## 2021-06-12 NOTE — Telephone Encounter (Signed)
Patient called. He has completed his MRI. Would like a call to go over the MRI.

## 2021-06-13 ENCOUNTER — Telehealth: Payer: Self-pay | Admitting: Physical Medicine and Rehabilitation

## 2021-06-13 NOTE — Telephone Encounter (Signed)
See previous message

## 2021-06-13 NOTE — Telephone Encounter (Signed)
Scheduled

## 2021-06-13 NOTE — Telephone Encounter (Signed)
Called patient again to schedule. No answer and voicemail is full.

## 2021-06-13 NOTE — Telephone Encounter (Signed)
Pt returning phone call for Joshua [name]. The best call back number is [phone].

## 2021-06-15 ENCOUNTER — Encounter: Payer: Self-pay | Admitting: Physical Medicine and Rehabilitation

## 2021-06-15 ENCOUNTER — Ambulatory Visit: Payer: Medicare Other | Admitting: Physical Medicine and Rehabilitation

## 2021-06-15 ENCOUNTER — Other Ambulatory Visit: Payer: Self-pay

## 2021-06-15 VITALS — BP 131/76 | HR 97

## 2021-06-15 DIAGNOSIS — M48061 Spinal stenosis, lumbar region without neurogenic claudication: Secondary | ICD-10-CM | POA: Diagnosis not present

## 2021-06-15 DIAGNOSIS — M47819 Spondylosis without myelopathy or radiculopathy, site unspecified: Secondary | ICD-10-CM

## 2021-06-15 DIAGNOSIS — M961 Postlaminectomy syndrome, not elsewhere classified: Secondary | ICD-10-CM

## 2021-06-15 DIAGNOSIS — M47816 Spondylosis without myelopathy or radiculopathy, lumbar region: Secondary | ICD-10-CM | POA: Diagnosis not present

## 2021-06-15 NOTE — Progress Notes (Signed)
Pt state lower back pain. Pt state standing and laying down makes the pain worse. Pt state he uses ice and takes pain meds to help ease his pain.  Numeric Pain Rating Scale and Functional Assessment Average Pain 10 Pain Right Now 2 My pain is constant, dull, and aching Pain is worse with: standing, some activites, and Laying down Pain improves with: heat/ice and medication   In the last MONTH (on 0-10 scale) has pain interfered with the following?  1. General activity like being  able to carry out your everyday physical activities such as walking, climbing stairs, carrying groceries, or moving a chair?  Rating(6)  2. Relation with others like being able to carry out your usual social activities and roles such as  activities at home, at work and in your community. Rating(7)  3. Enjoyment of life such that you have  been bothered by emotional problems such as feeling anxious, depressed or irritable?  Rating(8)

## 2021-06-15 NOTE — Progress Notes (Signed)
Joshua [name] - 75 y.o. male MRN [id number]  Date of birth: 1946-04-01  Office Visit Note: Visit Date: 06/15/2021 PCP: Leanna Battles, MD Referred by: Leanna Battles, MD  Subjective: Chief Complaint  Patient presents with   Lower Back - Pain   HPI: Joshua [name] is a 75 y.o. male who comes in today for evaluation and management of chronic, worsening and severe bilateral lower back pain.  Originally referred by Dr. Janie Morning and prior notes can be reviewed.  Patient states pain is exacerbated by walking, prolonged standing and bending over. Patient describes as soreness and aching, rates as 8 out of 10. Patient states some relief with rest, ice/heat and medications.  Based on initial evaluation we did order MRI of the lumbar spine.  Lumbar MRI exhibits posterior decompression at L3-L4 and L4-L5 and severe spinal stenosis at L2-L3. Patient had L3, L4, and L5 decompressive laminectomy performed by Dr. Jovita Gamma in 3875 without complications. Patient attended physical therapy in 2012 at [facility] and reports some relief of pain. Patient also had bilateral L3-L4 and L4-L5 medial branch facet blocks in 2014 with Dr. Magnus Sinning and reports significant relief of pain. Patient is currently followed by Dr. Narda Amber at [facility] for myasthenia gravis and reports he recently saw her and is doing well. Patient voices that he is trying to stay active at home, but is becoming more sedentary due to increased pain. Patient denies focal weakness, numbness and tingling. Patient denies recent trauma or falls.   Review of Systems  Neurological:  Negative for tingling, sensory change, focal weakness and weakness.  All other systems reviewed and are negative. Otherwise per HPI.  Assessment & Plan: Visit Diagnoses:    ICD-10-CM   1. Spondylosis without myelopathy or radiculopathy  M47.819 Ambulatory referral to Physical Medicine Rehab    2. Spinal stenosis  of lumbar region without neurogenic claudication  M48.061     3. Facet arthropathy, lumbar  M47.816     4. Post laminectomy syndrome  M96.1        Plan: Findings:  Chronic, worsening and severe bilateral axial back pain. Patient continues to have excruciating pain despite good conservative therapies such as formal physical therapy, heat/ice and medications. We did discuss patient's recent lumbar MRI in detail today using images and spine model. Patient's clinical presentation and exam are most consistent with classic facet mediated pain. We feel the next step is to perform diagnostic and hopefully therapeutic bilateral L2-L3 and L3-4 facet joint injections/medial branch blocks under fluoroscopic guidance.  These would be done in a double block paradigm if the first block is successful.  Patient did get significant pain relief from facet joint injections in 2014. We did discuss potential for radiofrequency ablation if he does well with facet joint injections. Patient was provided with educational literature on radiofrequency ablation to take home and review.   There appears clinically to be no neurogenic claudication but cannot rule out the fact that he does have significant stenosis as a potential source of pain.  If diagnostic medial branch blocks are ineffective I would look at diagnostic epidural injection.  We also discussed surgical consultation with patient today so that he can review his options. Patient states he would like to move forward with injections first, but is open to talking with surgeon if warranted. Patient encouraged to continue being active. No red flag symptoms noted upon exam today.    Meds & Orders: No orders of  the defined types were placed in this encounter.   Orders Placed This Encounter  Procedures   Ambulatory referral to Physical Medicine Rehab    Follow-up: Return in about 1 week (around 06/22/2021) for Bilateral L2-L3 facet joint injections/medial branch blocks.    Procedures: No procedures performed      Clinical History: MRI LUMBAR SPINE WITHOUT CONTRAST   TECHNIQUE: Multiplanar, multisequence MR imaging of the lumbar spine was performed. No intravenous contrast was administered.   COMPARISON:  11/07/2013   FINDINGS: Segmentation:  Standard.   Alignment:  New 3 mm retrolisthesis of L1 on L2 and L2 on L3.   Vertebrae: There is new prominent marrow edema in the superior half of the L3 vertebral body with milder edema along the L2 inferior endplate. There is no significant interval compression of the L3 vertebral body compared to the prior MRI, and this edema is felt to reflect degenerative marrow edema rather than a fracture. Modic type 2 degenerative endplate changes are present at multiple other levels. A small sclerotic focus in the T12 vertebral body is unchanged and likely represents a bone island.   Conus medullaris and cauda equina: Conus extends to the L1 level and is normal in signal. There is a redundant appearance of the cauda equina nerve roots above the high-grade stenosis at L2-3.   Paraspinal and other soft tissues: Partially visualized T2 hyperintensities in both kidneys, likely cysts. Postoperative changes in the posterior lumbar soft tissues. 1 cm fluid collection in the laminectomy bed at L4-5.   Disc levels:   T12-L1: Mild facet hypertrophy without disc herniation or stenosis.   L1-2: Disc desiccation and new moderate disc space narrowing. Retrolisthesis with bulging uncovered disc, endplate spurring, congenitally short pedicles, and moderate to severe facet and ligamentum flavum hypertrophy result in new mild spinal stenosis, mild bilateral lateral recess stenosis, and mild-to-moderate bilateral neural foraminal stenosis.   L2-3: Disc desiccation and new mild disc space narrowing. Retrolisthesis with bulging uncovered disc, congenitally short pedicles, and severe facet and ligamentum flavum hypertrophy  result in new severe spinal stenosis and moderate to severe right greater than left neural foraminal stenosis.   L3-4: Disc desiccation. Interval laminectomies. Disc bulging, small synovial cysts medial to both facet joints, and moderate to severe facet hypertrophy result in mild bilateral lateral recess stenosis and moderate bilateral neural foraminal stenosis without residual spinal stenosis.   L4-5: Partial ankylosis across the disc space. Interval laminectomies. Mild disc bulging, endplate spurring, and severe facet hypertrophy result in mild-to-moderate right and borderline left neural foraminal stenosis without residual spinal stenosis.   L5-S1: Partial ankylosis across the disc space. Mild disc bulging, endplate spurring, and mild facet hypertrophy without significant stenosis.   IMPRESSION: 1. Interval posterior decompression at L3-4 and L4-5 without residual spinal stenosis. 2. Progressive disc and facet degeneration elsewhere, most notable at L2-3 where there is severe spinal stenosis and moderate to severe neural foraminal stenosis. 3. Mild spinal stenosis and mild-to-moderate neural foraminal stenosis at L1-2.     Electronically Signed   By: Logan Bores M.D.   On: 06/07/2021 14:38 ------------   He reports that he has been smoking cigars. He has never used smokeless tobacco. No results for input(s): HGBA1C, LABURIC in the last 8760 hours.  Objective:  VS:  HT:    WT:   BMI:     BP:131/76  HR:97bpm  TEMP: ( )  RESP:  Physical Exam HENT:     Head: Normocephalic and atraumatic.  Right Ear: Tympanic membrane normal.     Left Ear: Tympanic membrane normal.     Nose: Nose normal.     Mouth/Throat:     Mouth: Mucous membranes are moist.  Eyes:     Pupils: Pupils are equal, round, and reactive to light.  Cardiovascular:     Rate and Rhythm: Normal rate.     Pulses: Normal pulses.  Pulmonary:     Effort: Pulmonary effort is normal.  Abdominal:      General: Abdomen is flat. There is no distension.  Musculoskeletal:        General: Tenderness present.     Cervical back: Normal range of motion.     Comments: Pt is slow to rise from seated position to standing. Pain noted upon facet loading. Strong distal strength without clonus, no pain upon palpation of greater trochanters. Sensation intact bilaterally. Walks independently, gait steady.    Skin:    General: Skin is warm and dry.     Capillary Refill: Capillary refill takes less than 2 seconds.  Neurological:     General: No focal deficit present.     Mental Status: He is alert.  Psychiatric:        Mood and Affect: Mood normal.    Ortho Exam  Imaging: No results found.  Past Medical/Family/Surgical/Social History: Medications & Allergies reviewed per EMR, new medications updated. 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 edema associated with type 2 diabetes mellitus ([place]) 07/12/2015   Senile nuclear sclerosis, bilateral 07/12/2015   Hypertensive retinopathy of both eyes 07/12/2015   Lumbar stenosis with neurogenic claudication 05/02/2015   Chronic low back pain 12/07/2014   Epiretinal membrane, left eye 10/20/2013   Myasthenia gravis without exacerbation ([place]) 05/19/2013   Carpal tunnel syndrome 05/13/2013   Vitamin B 12 deficiency 05/13/2013   Myasthenia gravis with exacerbation, adult form ([place]) 05/13/2013   Unspecified ptosis of eyelid 05/13/2013   Dysphonia 05/13/2013   Shortness of breath 05/13/2013   GERD (gastroesophageal reflux disease) 05/13/2013   Nonproliferative diabetic retinopathy ([place]) 09/18/2011   HEMORRHOIDS-INTERNAL 02/22/2009   RECTAL BLEEDING 02/22/2009   PERSONAL HX COLON CANCER 02/22/2009   ADENOCARCINOMA, COLON 02/21/2009   DM 02/21/2009   HYPERLIPIDEMIA  02/21/2009   HEMORRHOIDS 02/21/2009   DIVERTICULOSIS, COLON 02/21/2009   HYPERTENSION NEC 02/21/2009   Past Medical History:  Diagnosis Date   Anemia    Anxiety    Arthritis    Basal cell carcinoma    Carpal tunnel syndrome, bilateral    Chronic low back pain 12/07/2014   Colon cancer ([place]) 2006   Colon polyps    Diabetes ([place])    Diverticulosis    DJD (degenerative joint disease)    GERD (gastroesophageal reflux disease)    Hemorrhoids    Hyperlipidemia    Hypertension    Morbid obesity ([place])    Myasthenia gravis ([place])    Myasthenia gravis without exacerbation ([place]) 05/19/2013   Vitamin B12 deficiency    Family History  Problem Relation Age of Onset   Heart disease Mother    Prostate cancer Maternal Grandfather    Colon polyps Maternal Grandfather    Colon cancer Neg Hx    Past Surgical History:  Procedure Laterality Date   ANKLE ARTHROPLASTY Right 1969   BASAL CELL CARCINOMA EXCISION  face   CARPAL TUNNEL WITH CUBITAL TUNNEL Left 06/02/2013   Procedure: CARPAL TUNNEL WITH CUBITAL TUNNEL;  Surgeon: Tennis Must, MD;  Location: [facility];  Service: Orthopedics;  Laterality: Left;   CARPAL TUNNEL WITH CUBITAL TUNNEL Right 07/14/2013   Procedure: RIGHT CARPAL TUNNEL AND CUBITAL TUNNEL RELEASE;  Surgeon: Tennis Must, MD;  Location: [facility];  Service: Orthopedics;  Laterality: Right;   COLONOSCOPY     LUMBAR LAMINECTOMY/DECOMPRESSION MICRODISCECTOMY N/A 05/02/2015   Procedure: LUMBAR LAMINECTOMY/DECOMPRESSION MICRODISCECTOMY 2 LEVELS;  Surgeon: Jovita Gamma, MD;  Location: [facility];  Service: Neurosurgery;  Laterality: N/A;  L3 L4 L5 laminectomies   PATELLA FRACTURE SURGERY Left 2011   RIGHT COLECTOMY  2006   TONSILLECTOMY  1968   Social History   Occupational History   Occupation: Retired Firefighter: [employer]  Tobacco Use   Smoking status: Every Day    Types: Cigars   Smokeless tobacco: Never  Vaping  Use   Vaping Use: Never used  Substance and Sexual Activity   Alcohol use: Yes    Comment: 6 pack a year   Drug use: No   Sexual activity: Not on file

## 2021-06-22 ENCOUNTER — Telehealth: Payer: Self-pay | Admitting: Physical Medicine and Rehabilitation

## 2021-06-22 NOTE — Telephone Encounter (Signed)
Pt called stating he had a procedure with DR.Newton scheduled that he needs to R/S.   [phone]

## 2021-06-26 ENCOUNTER — Telehealth: Payer: Self-pay | Admitting: Physical Medicine and Rehabilitation

## 2021-06-26 NOTE — Telephone Encounter (Signed)
Pt called stating he didn't receive a call on 06/23/21 and he would  like to be tried again please.

## 2021-07-04 ENCOUNTER — Ambulatory Visit: Payer: Medicare Other | Admitting: Podiatry

## 2021-07-12 ENCOUNTER — Other Ambulatory Visit: Payer: Self-pay

## 2021-07-12 ENCOUNTER — Encounter: Payer: Self-pay | Admitting: Podiatry

## 2021-07-12 ENCOUNTER — Ambulatory Visit: Payer: Medicare Other | Admitting: Podiatry

## 2021-07-12 DIAGNOSIS — M79675 Pain in left toe(s): Secondary | ICD-10-CM | POA: Diagnosis not present

## 2021-07-12 DIAGNOSIS — E0843 Diabetes mellitus due to underlying condition with diabetic autonomic (poly)neuropathy: Secondary | ICD-10-CM | POA: Diagnosis not present

## 2021-07-12 DIAGNOSIS — M79674 Pain in right toe(s): Secondary | ICD-10-CM | POA: Diagnosis not present

## 2021-07-12 DIAGNOSIS — B351 Tinea unguium: Secondary | ICD-10-CM

## 2021-07-12 NOTE — Progress Notes (Signed)
This patient returns to my office for at risk foot care.  This patient requires this care by a professional since this patient will be at risk due to having  diabetes.  This patient is unable to cut nails himself since the patient cannot reach his nails.These nails are painful walking and wearing shoes.  This patient presents for at risk foot care today.  General Appearance  Alert, conversant and in no acute stress.  Vascular  Dorsalis pedis and posterior tibial  pulses are palpable  bilaterally.  Capillary return is within normal limits  bilaterally. Temperature is within normal limits  bilaterally.  Neurologic  Senn-Weinstein monofilament wire test within normal limits  bilaterally. Muscle power within normal limits bilaterally.  Nails Thick disfigured discolored nails with subungual debris  from hallux to fifth toes bilaterally. No evidence of bacterial infection or drainage bilaterally.  Orthopedic  No limitations of motion  feet .  No crepitus or effusions noted.  No bony pathology or digital deformities noted.  Skin  normotropic skin with no porokeratosis noted bilaterally.  No signs of infections or ulcers noted.     Onychomycosis  Pain in right toes  Pain in left toes  Consent was obtained for treatment procedures.   Mechanical debridement of nails 1-5  bilaterally performed with a nail nipper.  Filed with dremel without incident.    Return office visit   3 months                   Told patient to return for periodic foot care and evaluation due to potential at risk complications.   Lacora Folmer DPM   

## 2021-07-19 ENCOUNTER — Other Ambulatory Visit: Payer: Self-pay

## 2021-07-19 ENCOUNTER — Encounter: Payer: Self-pay | Admitting: Physical Medicine and Rehabilitation

## 2021-07-19 ENCOUNTER — Ambulatory Visit: Payer: Self-pay

## 2021-07-19 ENCOUNTER — Ambulatory Visit (INDEPENDENT_AMBULATORY_CARE_PROVIDER_SITE_OTHER): Payer: Medicare Other | Admitting: Physical Medicine and Rehabilitation

## 2021-07-19 VITALS — BP 127/72 | HR 101

## 2021-07-19 DIAGNOSIS — M47819 Spondylosis without myelopathy or radiculopathy, site unspecified: Secondary | ICD-10-CM | POA: Diagnosis not present

## 2021-07-19 NOTE — Patient Instructions (Signed)

## 2021-07-19 NOTE — Progress Notes (Signed)
Pt state lower back pain. Pt state standing and laying down makes the pain worse. Pt state he takes pain meds to help ease his pain.  Numeric Pain Rating Scale and Functional Assessment Average Pain 7   In the last MONTH (on 0-10 scale) has pain interfered with the following?  1. General activity like being  able to carry out your everyday physical activities such as walking, climbing stairs, carrying groceries, or moving a chair?  Rating(10)   +Driver, -BT, -Dye Allergies.

## 2021-07-26 NOTE — Progress Notes (Signed)
Joshua [name] - 75 y.o. male MRN [id number]  Date of birth: 1946-01-27  Office Visit Note: Visit Date: 07/19/2021 PCP: Leanna Battles, MD Referred by: Leanna Battles, MD  Subjective: Chief Complaint  Patient presents with   Lower Back - Pain   HPI:  Joshua [name] is a 75 y.o. male who comes in today at the request of Barnet Pall, FNP for planned Bilateral  L2-3 Lumbar facet/medial branch block with fluoroscopic guidance.  The patient has failed conservative care including home exercise, medications, time and activity modification.  This injection will be diagnostic and hopefully therapeutic.  Please see requesting physician notes for further details and justification.  Exam has shown concordant pain with facet joint loading.   ROS Otherwise per HPI.  Assessment & Plan: Visit Diagnoses:    ICD-10-CM   1. Spondylosis without myelopathy or radiculopathy  M47.819 XR C-ARM NO REPORT      Plan: No additional findings.   Meds & Orders: No orders of the defined types were placed in this encounter.   Orders Placed This Encounter  Procedures   XR C-ARM NO REPORT    Follow-up: Return if symptoms worsen or fail to improve.   Procedures: Lumbar Diagnostic Facet Joint Nerve Block with Fluoroscopic Guidance   Patient: Joshua [name]      Date of Birth: Aug 07, 1946 MRN: [id number] PCP: Leanna Battles, MD      Visit Date: 07/19/2021   Universal Protocol:    Date/Time: 11/16/225:45 AM  Consent Given By: the patient  Position: PRONE  Additional Comments: Vital signs were monitored before and after the procedure. Patient was prepped and draped in the usual sterile fashion. The correct patient, procedure, and site was verified.   Injection Procedure Details:   Procedure diagnoses:  1. Spondylosis without myelopathy or radiculopathy      Meds Administered: 2 mL of 0.5% Marcaine  Laterality: Bilateral  Location/Site: L2-L3, L1 and L2 medial branches  Needle: 5.0 in.,  25 ga.  Short bevel or Quincke spinal needle  Needle Placement: Oblique pedical  Findings:   -Comments: There was excellent flow of contrast along the articular pillars without intravascular flow.  Procedure Details: The fluoroscope beam is vertically oriented in AP and then obliqued 15 to 20 degrees to the ipsilateral side of the desired nerve to achieve the "Scotty dog" appearance.  The skin over the target area of the junction of the superior articulating process and the transverse process (sacral ala if blocking the L5 dorsal rami) was locally anesthetized with a 1 ml volume of 1% Lidocaine without Epinephrine.  The spinal needle was inserted and advanced in a trajectory view down to the target.   After contact with periosteum and negative aspirate for blood and CSF, correct placement without intravascular or epidural spread was confirmed by injecting 0.5 ml. of Isovue-250.  A spot radiograph was obtained of this image.    Next, a 0.5 ml. volume of the injectate described above was injected. The needle was then redirected to the other facet joint nerves mentioned above if needed.  Prior to the procedure, the patient was given a Pain Diary which was completed for baseline measurements.  After the procedure, the patient rated their pain every 30 minutes and will continue rating at this frequency for a total of 5 hours.  The patient has been asked to complete the Diary and return to [place] by mail, fax or hand delivered as soon as possible.   Additional Comments:  No  complications occurred Dressing: 2 x 2 sterile gauze and Band-Aid    Post-procedure details: Patient was observed during the procedure. Post-procedure instructions were reviewed.  Patient left the clinic in stable condition.      Clinical History: MRI LUMBAR SPINE WITHOUT CONTRAST   TECHNIQUE: Multiplanar, multisequence MR imaging of the lumbar spine was performed. No intravenous contrast was administered.   COMPARISON:   11/07/2013   FINDINGS: Segmentation:  Standard.   Alignment:  New 3 mm retrolisthesis of L1 on L2 and L2 on L3.   Vertebrae: There is new prominent marrow edema in the superior half of the L3 vertebral body with milder edema along the L2 inferior endplate. There is no significant interval compression of the L3 vertebral body compared to the prior MRI, and this edema is felt to reflect degenerative marrow edema rather than a fracture. Modic type 2 degenerative endplate changes are present at multiple other levels. A small sclerotic focus in the T12 vertebral body is unchanged and likely represents a bone island.   Conus medullaris and cauda equina: Conus extends to the L1 level and is normal in signal. There is a redundant appearance of the cauda equina nerve roots above the high-grade stenosis at L2-3.   Paraspinal and other soft tissues: Partially visualized T2 hyperintensities in both kidneys, likely cysts. Postoperative changes in the posterior lumbar soft tissues. 1 cm fluid collection in the laminectomy bed at L4-5.   Disc levels:   T12-L1: Mild facet hypertrophy without disc herniation or stenosis.   L1-2: Disc desiccation and new moderate disc space narrowing. Retrolisthesis with bulging uncovered disc, endplate spurring, congenitally short pedicles, and moderate to severe facet and ligamentum flavum hypertrophy result in new mild spinal stenosis, mild bilateral lateral recess stenosis, and mild-to-moderate bilateral neural foraminal stenosis.   L2-3: Disc desiccation and new mild disc space narrowing. Retrolisthesis with bulging uncovered disc, congenitally short pedicles, and severe facet and ligamentum flavum hypertrophy result in new severe spinal stenosis and moderate to severe right greater than left neural foraminal stenosis.   L3-4: Disc desiccation. Interval laminectomies. Disc bulging, small synovial cysts medial to both facet joints, and moderate to  severe facet hypertrophy result in mild bilateral lateral recess stenosis and moderate bilateral neural foraminal stenosis without residual spinal stenosis.   L4-5: Partial ankylosis across the disc space. Interval laminectomies. Mild disc bulging, endplate spurring, and severe facet hypertrophy result in mild-to-moderate right and borderline left neural foraminal stenosis without residual spinal stenosis.   L5-S1: Partial ankylosis across the disc space. Mild disc bulging, endplate spurring, and mild facet hypertrophy without significant stenosis.   IMPRESSION: 1. Interval posterior decompression at L3-4 and L4-5 without residual spinal stenosis. 2. Progressive disc and facet degeneration elsewhere, most notable at L2-3 where there is severe spinal stenosis and moderate to severe neural foraminal stenosis. 3. Mild spinal stenosis and mild-to-moderate neural foraminal stenosis at L1-2.     Electronically Signed   By: Logan Bores M.D.   On: 06/07/2021 14:38 ------------     Objective:  VS:  HT:    WT:   BMI:     BP:127/72  HR:(!) 101bpm  TEMP: ( )  RESP:  Physical Exam Vitals and nursing note reviewed.  Constitutional:      General: He is not in acute distress.    Appearance: Normal appearance. He is obese. He is not ill-appearing.  HENT:     Head: Normocephalic and atraumatic.     Right Ear: External ear normal.  Left Ear: External ear normal.     Nose: No congestion.  Eyes:     Extraocular Movements: Extraocular movements intact.  Cardiovascular:     Rate and Rhythm: Normal rate.     Pulses: Normal pulses.  Pulmonary:     Effort: Pulmonary effort is normal. No respiratory distress.  Abdominal:     General: There is no distension.     Palpations: Abdomen is soft.  Musculoskeletal:        General: No tenderness or signs of injury.     Cervical back: Neck supple.     Right lower leg: No edema.     Left lower leg: No edema.     Comments: Patient has  good distal strength without clonus.  Skin:    Findings: No erythema or rash.  Neurological:     General: No focal deficit present.     Mental Status: He is alert and oriented to person, place, and time.     Sensory: No sensory deficit.     Motor: No weakness or abnormal muscle tone.     Coordination: Coordination normal.  Psychiatric:        Mood and Affect: Mood normal.        Behavior: Behavior normal.     Imaging: No results found.

## 2021-09-10 HISTORY — PX: MOLE REMOVAL: SHX2046

## 2021-09-18 ENCOUNTER — Other Ambulatory Visit: Payer: Self-pay | Admitting: Neurology

## 2021-09-20 ENCOUNTER — Other Ambulatory Visit: Payer: Self-pay | Admitting: Neurology

## 2021-09-21 ENCOUNTER — Telehealth: Payer: Self-pay | Admitting: Neurology

## 2021-09-21 MED ORDER — MYCOPHENOLATE MOFETIL 500 MG PO TABS: 500.0000 mg | ORAL_TABLET | Freq: Two times a day (BID) | ORAL | 0 refills | Status: DC

## 2021-09-21 NOTE — Telephone Encounter (Signed)
Refill has been sent to pharmacy as requested. Called patient and left message per DPR that rx was sent.

## 2021-09-21 NOTE — Telephone Encounter (Signed)
1. Which medications need refilled? (List name and dosage, if known) mycophenolate, 500 MG  2. Which pharmacy/location is medication to be sent to? (include street and city if local pharmacy) [facility] mail [facility]  3. Do they need a 30 day or 90 day supply? 90 day increments  Patient states she is not sure whether it is needing a PA or not but he requested from the pharmacy and they have not heard back from the office.

## 2021-10-17 ENCOUNTER — Ambulatory Visit: Payer: Medicare Other | Admitting: Podiatry

## 2021-10-17 ENCOUNTER — Other Ambulatory Visit: Payer: Self-pay

## 2021-10-17 ENCOUNTER — Encounter: Payer: Self-pay | Admitting: Podiatry

## 2021-10-17 DIAGNOSIS — E0843 Diabetes mellitus due to underlying condition with diabetic autonomic (poly)neuropathy: Secondary | ICD-10-CM

## 2021-10-17 DIAGNOSIS — M79675 Pain in left toe(s): Secondary | ICD-10-CM | POA: Diagnosis not present

## 2021-10-17 DIAGNOSIS — M79674 Pain in right toe(s): Secondary | ICD-10-CM

## 2021-10-17 DIAGNOSIS — B351 Tinea unguium: Secondary | ICD-10-CM | POA: Diagnosis not present

## 2021-10-17 NOTE — Progress Notes (Signed)
This patient returns to my office for at risk foot care.  This patient requires this care by a professional since this patient will be at risk due to having  diabetes.  This patient is unable to cut nails himself since the patient cannot reach his nails.These nails are painful walking and wearing shoes.  This patient presents for at risk foot care today.  General Appearance  Alert, conversant and in no acute stress.  Vascular  Dorsalis pedis and posterior tibial  pulses are palpable  bilaterally.  Capillary return is within normal limits  bilaterally. Temperature is within normal limits  bilaterally.  Neurologic  Senn-Weinstein monofilament wire test within normal limits  bilaterally. Muscle power within normal limits bilaterally.  Nails Thick disfigured discolored nails with subungual debris  from hallux to fifth toes bilaterally. No evidence of bacterial infection or drainage bilaterally.  Orthopedic  No limitations of motion  feet .  No crepitus or effusions noted.  No bony pathology or digital deformities noted.  Skin  normotropic skin with no porokeratosis noted bilaterally.  No signs of infections or ulcers noted.     Onychomycosis  Pain in right toes  Pain in left toes  Consent was obtained for treatment procedures.   Mechanical debridement of nails 1-5  bilaterally performed with a nail nipper.  Filed with dremel without incident.    Return office visit   3 months                   Told patient to return for periodic foot care and evaluation due to potential at risk complications.   Sade Mehlhoff DPM   

## 2021-10-20 ENCOUNTER — Ambulatory Visit: Payer: Medicare Other | Admitting: Neurology

## 2021-10-20 ENCOUNTER — Other Ambulatory Visit: Payer: Self-pay

## 2021-10-20 ENCOUNTER — Encounter: Payer: Self-pay | Admitting: Neurology

## 2021-10-20 ENCOUNTER — Other Ambulatory Visit (INDEPENDENT_AMBULATORY_CARE_PROVIDER_SITE_OTHER): Payer: Medicare Other

## 2021-10-20 VITALS — BP 134/76 | HR 96 | Ht 68.0 in | Wt 267.0 lb

## 2021-10-20 DIAGNOSIS — G7000 Myasthenia gravis without (acute) exacerbation: Secondary | ICD-10-CM | POA: Diagnosis not present

## 2021-10-20 LAB — COMPREHENSIVE METABOLIC PANEL
AG Ratio: 1.9 (calc) (ref 1.0–2.5)
ALT: 7 U/L — ABNORMAL LOW (ref 9–46)
AST: 5 U/L — ABNORMAL LOW (ref 10–35)
Albumin: 3.8 g/dL (ref 3.6–5.1)
Alkaline phosphatase (APISO): 86 U/L (ref 35–144)
BUN: 19 mg/dL (ref 7–25)
CO2: 25 mmol/L (ref 20–32)
Calcium: 8.8 mg/dL (ref 8.6–10.3)
Chloride: 109 mmol/L (ref 98–110)
Creat: 1.12 mg/dL (ref 0.70–1.28)
Globulin: 2 g/dL (calc) (ref 1.9–3.7)
Glucose, Bld: 100 mg/dL — ABNORMAL HIGH (ref 65–99)
Potassium: 4.3 mmol/L (ref 3.5–5.3)
Sodium: 141 mmol/L (ref 135–146)
Total Bilirubin: 0.5 mg/dL (ref 0.2–1.2)
Total Protein: 5.8 g/dL — ABNORMAL LOW (ref 6.1–8.1)

## 2021-10-20 LAB — CBC WITH DIFFERENTIAL/PLATELET
Absolute Monocytes: 876 cells/uL (ref 200–950)
Basophils Absolute: 34 cells/uL (ref 0–200)
Basophils Relative: 0.4 %
Eosinophils Absolute: 68 cells/uL (ref 15–500)
Eosinophils Relative: 0.8 %
HCT: 38.7 % (ref 38.5–50.0)
Hemoglobin: 13.3 g/dL (ref 13.2–17.1)
Lymphs Abs: 1726 cells/uL (ref 850–3900)
MCH: 32.4 pg (ref 27.0–33.0)
MCHC: 34.4 g/dL (ref 32.0–36.0)
MCV: 94.4 fL (ref 80.0–100.0)
MPV: 10.1 fL (ref 7.5–12.5)
Monocytes Relative: 10.3 %
Neutro Abs: 5797 cells/uL (ref 1500–7800)
Neutrophils Relative %: 68.2 %
Platelets: 280 10*3/uL (ref 140–400)
RBC: 4.1 10*6/uL — ABNORMAL LOW (ref 4.20–5.80)
RDW: 12.1 % (ref 11.0–15.0)
Total Lymphocyte: 20.3 %
WBC: 8.5 10*3/uL (ref 3.8–10.8)

## 2021-10-20 MED ORDER — MYCOPHENOLATE MOFETIL 500 MG PO TABS: 500.0000 mg | ORAL_TABLET | Freq: Two times a day (BID) | ORAL | 3 refills | Status: DC

## 2021-10-20 NOTE — Patient Instructions (Signed)
Check labs  Return to clinic in 1 year

## 2021-10-20 NOTE — Progress Notes (Signed)
Follow-up Visit   Date: 10/20/21   Joshua [name] MRN: [id number] DOB: 12/10/1945   Interim History: Joshua [name] is a 76 y.o. right-handed Caucasian male with insulin-dependent diabetes mellitus, hypertension, hyperlipidemia, adenocarcinoma of the colon s/p right hemicolectomy, and chronic low back pain returning to the clinic for follow-up of myasthenia gravis.  The patient was accompanied to the clinic by self.  History of present illness: He was diagnosed in 2012 after presenting with dysphagia, ptosis, and difficulty with chewing.  He did not have limb weakness or double vision. He was hospitalized and treated with IVIG after which, he was started on prednisone 40mg  and Cellcept. He was seeing Dr. Erling Cruz and then transferred care with Dr. Jannifer Franklin at [facility].  He was previously taking 1000mg  twice daily, which has been slowly tapered.   He has not been hospitalized since his diagnosis.  He is currently taking mestinon 30mg  twice daily Cellcept 500mg  twice daily.  He has mild right ptosis, no double vision, difficulty swallowing/talking, or limb weakness.  He has not had an exacerbation since 2012 with his initial diagnosis.  He does not know if he is antibody positive.   UPDATE 10/20/2021: He is here for 1 year follow-up visit.  His myasthenia is under good control.  He denies any double vision, drooping of the eyelids, difficulty swallowing or talking, or limb weakness.  Sometimes he has generalized sense of fatigue.  He is compliant with CellCept 500 mg twice daily and takes Mestinon 30 mg daily as needed.  He was off Mestinon for several months and decided to restart it.  No new symptoms.    Medications:  Current Outpatient Medications on File Prior to Visit  Medication Sig Dispense Refill   atorvastatin (LIPITOR) 40 MG tablet Take 40 mg by mouth every evening.     B-D ULTRAFINE III SHORT PEN 31G X 8 MM MISC 200 each by Other route 3 (three) times daily between meals.     busPIRone  (BUSPAR) 7.5 MG tablet TAKE 1 TABLET BY MOUTH TWICE DAILY 180 tablet 1   Calcium Carb-Cholecalciferol (CALCIUM + D3 PO) Take 1 tablet by mouth daily before supper.     cetirizine (ZYRTEC) 10 MG tablet Take 10 mg by mouth daily as needed for allergies.     cyanocobalamin (,VITAMIN B-12,) 1000 MCG/ML injection Inject 1,000 mcg into the muscle as directed. Receives injection every other month     Cyanocobalamin (VITAMIN B-12) 2500 MCG SUBL Place 2,500 mcg under the tongue in the morning.     diclofenac Sodium (VOLTAREN) 1 % GEL Apply topically as needed.     Eyelid Cleansers (STERILID EX) Apply 1 application to eye daily as needed (eyelid inflammation).     famotidine (PEPCID) 20 MG tablet Take 20 mg by mouth 2 (two) times daily.     FARXIGA 10 MG TABS tablet Take 10 mg by mouth in the morning.     fluticasone (FLONASE) 50 MCG/ACT nasal spray Place 1 spray into both nostrils 2 (two) times daily as needed for allergies.     gabapentin (NEURONTIN) 300 MG capsule Take 600 mg by mouth at bedtime.  3   HUMALOG MIX 75/25 KWIKPEN (75-25) 100 UNIT/ML KwikPen Inject 42 Units into the skin in the morning and at bedtime.     ketoconazole (NIZORAL) 2 % shampoo Apply 1 application topically once a week.  5   Lidocaine 4 % PTCH Place 1 patch onto the skin daily as needed (back pain.).  lisinopril (PRINIVIL,ZESTRIL) 5 MG tablet Take 5 mg by mouth in the morning.     metFORMIN (GLUCOPHAGE) 1000 MG tablet Take 1,000 mg by mouth at bedtime.     Multiple Vitamins-Minerals (OCUVITE PRESERVISION PO) Take 1 tablet by mouth in the morning.     ONE TOUCH ULTRA TEST test strip 200 each by Other route 3 (three) times daily.     ONETOUCH DELICA LANCETS 37C MISC 100 each by Other route 3 (three) times daily.     Polyethyl Glycol-Propyl Glycol (LUBRICANT EYE DROPS) 0.4-0.3 % SOLN Place 1 drop into both eyes 3 (three) times daily as needed (dry/irritated eyes.).     pyridostigmine (MESTINON) 60 MG tablet TAKE ONE-HALF TABLET  BY  MOUTH TWICE DAILY OK TO  TAKE AN EXTRA DOSE AS  NEEDED (Patient taking differently: Take 30 mg by mouth 2 (two) times daily at 8 am and 10 pm.) 200 tablet 3   saccharomyces boulardii (FLORASTOR) 250 MG capsule Take 250 mg by mouth in the morning.     sulindac (CLINORIL) 150 MG tablet Take 150 mg by mouth daily as needed (pain/inflammation).     traMADol (ULTRAM) 50 MG tablet Take 100 mg by mouth 3 (three) times daily.     No current facility-administered medications on file prior to visit.    Allergies:  Allergies  Allergen Reactions   Aluminum Chlorohydrate Rash   Niacin And Related Other (See Comments)    flushing    Vital Signs:  BP 134/76    Pulse 96    Ht 5\' 8"  (1.727 m)    Wt 267 lb (121.1 kg)    SpO2 98%    BMI 40.60 kg/m    Neurological Exam: MENTAL STATUS including orientation to time, place, person, recent and remote memory, attention span and concentration, language, and fund of knowledge is normal.  Speech is not dysarthric.  CRANIAL NERVES:  No visual field defects.  Pupils equal round and reactive to light.  Normal conjugate, extra-ocular eye movements in all directions of gaze.  No ptosis.  Face is symmetric. Facial muscles are 5/5   MOTOR:  Motor strength is 5/5 in all extremities.  No atrophy, fasciculations or abnormal movements.  No pronator drift.  Tone is normal.    COORDINATION/GAIT:  Gait is wide-based, unassisted and stable.  Data: n/a  IMPRESSION/PLAN: Myasthenia gravis without exacerbation (dx2012 with bulbar symptoms). Today, there is no evidence of disease manifestation .  -Continue CellCept 500 mg twice daily  -Okay to take Mestinon 30 mg daily as needed  -Check CBC and CMP  2.  Vitamin B12 deficiency  - Completed injections  - OK to take B12 1053mcg daily  Return to clinic in 1 year.   Thank you for allowing me to participate in patient's care.  If I can answer any additional questions, I would be pleased to do so.     Sincerely,    Osmara Drummonds K. Posey Pronto, DO

## 2021-11-14 ENCOUNTER — Other Ambulatory Visit: Payer: Self-pay | Admitting: Neurology

## 2022-01-16 ENCOUNTER — Encounter: Payer: Self-pay | Admitting: Podiatry

## 2022-01-16 ENCOUNTER — Ambulatory Visit: Payer: Medicare Other | Admitting: Podiatry

## 2022-01-16 DIAGNOSIS — B351 Tinea unguium: Secondary | ICD-10-CM

## 2022-01-16 DIAGNOSIS — M79674 Pain in right toe(s): Secondary | ICD-10-CM

## 2022-01-16 DIAGNOSIS — M79675 Pain in left toe(s): Secondary | ICD-10-CM

## 2022-01-16 DIAGNOSIS — E0843 Diabetes mellitus due to underlying condition with diabetic autonomic (poly)neuropathy: Secondary | ICD-10-CM

## 2022-01-16 NOTE — Progress Notes (Signed)
This patient returns to my office for at risk foot care.  This patient requires this care by a professional since this patient will be at risk due to having  diabetes.  This patient is unable to cut nails himself since the patient cannot reach his nails.These nails are painful walking and wearing shoes.  This patient presents for at risk foot care today.  General Appearance  Alert, conversant and in no acute stress.  Vascular  Dorsalis pedis and posterior tibial  pulses are palpable  bilaterally.  Capillary return is within normal limits  bilaterally. Temperature is within normal limits  bilaterally.  Neurologic  Senn-Weinstein monofilament wire test within normal limits  bilaterally. Muscle power within normal limits bilaterally.  Nails Thick disfigured discolored nails with subungual debris  from hallux to fifth toes bilaterally. No evidence of bacterial infection or drainage bilaterally.  Orthopedic  No limitations of motion  feet .  No crepitus or effusions noted.  No bony pathology or digital deformities noted.  Skin  normotropic skin with no porokeratosis noted bilaterally.  No signs of infections or ulcers noted.     Onychomycosis  Pain in right toes  Pain in left toes  Consent was obtained for treatment procedures.   Mechanical debridement of nails 1-5  bilaterally performed with a nail nipper.  Filed with dremel without incident.    Return office visit   3 months                   Told patient to return for periodic foot care and evaluation due to potential at risk complications.   Lennis Rader DPM   

## 2022-02-23 ENCOUNTER — Telehealth: Payer: Self-pay | Admitting: Physical Medicine and Rehabilitation

## 2022-02-23 NOTE — Telephone Encounter (Signed)
Pt called to set an appt. Please call pt at [phone].

## 2022-02-26 ENCOUNTER — Telehealth: Payer: Self-pay | Admitting: Physical Medicine and Rehabilitation

## 2022-02-26 NOTE — Telephone Encounter (Signed)
Patient returned call asked for a call back. The number to contact patient is [phone]

## 2022-02-27 ENCOUNTER — Telehealth: Payer: Self-pay | Admitting: Physical Medicine and Rehabilitation

## 2022-02-27 NOTE — Telephone Encounter (Signed)
Pt returned call to [facility] to set an appt. Please call pt at [phone]

## 2022-03-06 ENCOUNTER — Ambulatory Visit: Payer: Self-pay

## 2022-03-06 ENCOUNTER — Ambulatory Visit: Payer: Medicare Other | Admitting: Physical Medicine and Rehabilitation

## 2022-03-06 ENCOUNTER — Encounter: Payer: Self-pay | Admitting: Physical Medicine and Rehabilitation

## 2022-03-06 VITALS — BP 126/63 | HR 102

## 2022-03-06 DIAGNOSIS — M47819 Spondylosis without myelopathy or radiculopathy, site unspecified: Secondary | ICD-10-CM

## 2022-03-06 DIAGNOSIS — M47816 Spondylosis without myelopathy or radiculopathy, lumbar region: Secondary | ICD-10-CM

## 2022-03-06 MED ORDER — METHYLPREDNISOLONE ACETATE 80 MG/ML IJ SUSP: 80.0000 mg | Freq: Once | INTRAMUSCULAR | Status: DC

## 2022-03-06 NOTE — Progress Notes (Signed)
t state lower back pain. Pt state standing and laying down makes the pain worse. Pt state he takes pain meds to help ease his pain.  Numeric Pain Rating Scale and Functional Assessment Average Pain 6   In the last MONTH (on 0-10 scale) has pain interfered with the following?  1. General activity like being  able to carry out your everyday physical activities such as walking, climbing stairs, carrying groceries, or moving a chair?  Rating(10)   +Driver, -BT, -Dye Allergies.

## 2022-04-03 NOTE — Progress Notes (Signed)
SEARCY MIYOSHI - 76 y.o. male MRN [id number]  Date of birth: 1945-12-25  Office Visit Note: Visit Date: 03/06/2022 PCP: Donnajean Lopes, MD Referred by: Donnajean Lopes, MD  Subjective: Chief Complaint  Patient presents with   Lower Back - Pain   HPI:  JOHNSTON MADDOCKS is a 76 y.o. male who comes in today for planned repeat Bilateral L2-3 Lumbar facet/medial branch block with fluoroscopic guidance.  The patient has failed conservative care including home exercise, medications, time and activity modification.  This injection will be diagnostic and hopefully therapeutic.  Please see requesting physician notes for further details and justification.  Exam shows concordant low back pain with facet joint loading and extension. Patient received more than 80% pain relief from prior injection. This would be the second block in a diagnostic double block paradigm.     Referring:Megan Jimmye Norman, FNP and Dr. Janie Morning   ROS Otherwise per HPI.  Assessment & Plan: Visit Diagnoses:    ICD-10-CM   1. Spondylosis without myelopathy or radiculopathy  M47.819 XR C-ARM NO REPORT    Facet Injection    DISCONTINUED: methylPREDNISolone acetate (DEPO-MEDROL) injection 80 mg      Plan: No additional findings.   Meds & Orders:  Meds ordered this encounter  Medications   DISCONTD: methylPREDNISolone acetate (DEPO-MEDROL) injection 80 mg    Orders Placed This Encounter  Procedures   Facet Injection   XR C-ARM NO REPORT    Follow-up: Return for Review Pain Diary.   Procedures: No procedures performed  Lumbar Diagnostic Facet Joint Nerve Block with Fluoroscopic Guidance   Patient: ADDAM GOELLER      Date of Birth: 1945-11-03 MRN: [id number] PCP: Donnajean Lopes, MD      Visit Date: 03/06/2022   Universal Protocol:    Date/Time: 07/25/238:49 PM  Consent Given By: the patient  Position: PRONE  Additional Comments: Vital signs were monitored before and after the procedure. Patient  was prepped and draped in the usual sterile fashion. The correct patient, procedure, and site was verified.   Injection Procedure Details:   Procedure diagnoses:  1. Spondylosis without myelopathy or radiculopathy      Meds Administered:  Meds ordered this encounter  Medications   methylPREDNISolone acetate (DEPO-MEDROL) injection 80 mg     Laterality: Bilateral  Location/Site: L2-L3, L1 and L2 medial branches  Needle: 5.0 in., 25 ga.  Short bevel or Quincke spinal needle  Needle Placement: Oblique pedical  Findings:   -Comments: There was excellent flow of contrast along the articular pillars without intravascular flow.  Procedure Details: The fluoroscope beam is vertically oriented in AP and then obliqued 15 to 20 degrees to the ipsilateral side of the desired nerve to achieve the "Scotty dog" appearance.  The skin over the target area of the junction of the superior articulating process and the transverse process (sacral ala if blocking the L5 dorsal rami) was locally anesthetized with a 1 ml volume of 1% Lidocaine without Epinephrine.  The spinal needle was inserted and advanced in a trajectory view down to the target.   After contact with periosteum and negative aspirate for blood and CSF, correct placement without intravascular or epidural spread was confirmed by injecting 0.5 ml. of Isovue-250.  A spot radiograph was obtained of this image.    Next, a 0.5 ml. volume of the injectate described above was injected. The needle was then redirected to the other facet joint nerves mentioned above if needed.  Prior  to the procedure, the patient was given a Pain Diary which was completed for baseline measurements.  After the procedure, the patient rated their pain every 30 minutes and will continue rating at this frequency for a total of 5 hours.  The patient has been asked to complete the Diary and return to [place] by mail, fax or hand delivered as soon as possible.   Additional  Comments:  The patient tolerated the procedure well Dressing: 2 x 2 sterile gauze and Band-Aid    Post-procedure details: Patient was observed during the procedure. Post-procedure instructions were reviewed.  Patient left the clinic in stable condition.   Clinical History: No specialty comments available.     Objective:  VS:  HT:    WT:   BMI:     BP:126/63  HR:(!) 102bpm  TEMP: ( )  RESP:  Physical Exam Vitals and nursing note reviewed.  Constitutional:      General: He is not in acute distress.    Appearance: Normal appearance. He is not ill-appearing.  HENT:     Head: Normocephalic and atraumatic.     Right Ear: External ear normal.     Left Ear: External ear normal.     Nose: No congestion.  Eyes:     Extraocular Movements: Extraocular movements intact.  Cardiovascular:     Rate and Rhythm: Normal rate.     Pulses: Normal pulses.  Pulmonary:     Effort: Pulmonary effort is normal. No respiratory distress.  Abdominal:     General: There is no distension.     Palpations: Abdomen is soft.  Musculoskeletal:        General: No tenderness or signs of injury.     Cervical back: Neck supple.     Right lower leg: No edema.     Left lower leg: No edema.     Comments: Patient has good distal strength without clonus.  Skin:    Findings: No erythema or rash.  Neurological:     General: No focal deficit present.     Mental Status: He is alert and oriented to person, place, and time.     Sensory: No sensory deficit.     Motor: No weakness or abnormal muscle tone.     Coordination: Coordination normal.  Psychiatric:        Mood and Affect: Mood normal.        Behavior: Behavior normal.      Imaging: No results found.

## 2022-04-03 NOTE — Procedures (Signed)
Lumbar Diagnostic Facet Joint Nerve Block with Fluoroscopic Guidance   Patient: Joshua [name]      Date of Birth: April 07, 1946 MRN: [id number] PCP: Donnajean Lopes, MD      Visit Date: 03/06/2022   Universal Protocol:    Date/Time: 07/25/238:49 PM  Consent Given By: the patient  Position: PRONE  Additional Comments: Vital signs were monitored before and after the procedure. Patient was prepped and draped in the usual sterile fashion. The correct patient, procedure, and site was verified.   Injection Procedure Details:   Procedure diagnoses:  1. Spondylosis without myelopathy or radiculopathy      Meds Administered:  Meds ordered this encounter  Medications   methylPREDNISolone acetate (DEPO-MEDROL) injection 80 mg     Laterality: Bilateral  Location/Site: L2-L3, L1 and L2 medial branches  Needle: 5.0 in., 25 ga.  Short bevel or Quincke spinal needle  Needle Placement: Oblique pedical  Findings:   -Comments: There was excellent flow of contrast along the articular pillars without intravascular flow.  Procedure Details: The fluoroscope beam is vertically oriented in AP and then obliqued 15 to 20 degrees to the ipsilateral side of the desired nerve to achieve the "Scotty dog" appearance.  The skin over the target area of the junction of the superior articulating process and the transverse process (sacral ala if blocking the L5 dorsal rami) was locally anesthetized with a 1 ml volume of 1% Lidocaine without Epinephrine.  The spinal needle was inserted and advanced in a trajectory view down to the target.   After contact with periosteum and negative aspirate for blood and CSF, correct placement without intravascular or epidural spread was confirmed by injecting 0.5 ml. of Isovue-250.  A spot radiograph was obtained of this image.    Next, a 0.5 ml. volume of the injectate described above was injected. The needle was then redirected to the other facet joint nerves mentioned  above if needed.  Prior to the procedure, the patient was given a Pain Diary which was completed for baseline measurements.  After the procedure, the patient rated their pain every 30 minutes and will continue rating at this frequency for a total of 5 hours.  The patient has been asked to complete the Diary and return to [place] by mail, fax or hand delivered as soon as possible.   Additional Comments:  The patient tolerated the procedure well Dressing: 2 x 2 sterile gauze and Band-Aid    Post-procedure details: Patient was observed during the procedure. Post-procedure instructions were reviewed.  Patient left the clinic in stable condition.

## 2022-04-04 ENCOUNTER — Telehealth: Payer: Self-pay | Admitting: Physical Medicine and Rehabilitation

## 2022-04-04 ENCOUNTER — Other Ambulatory Visit: Payer: Self-pay | Admitting: Physical Medicine and Rehabilitation

## 2022-04-04 DIAGNOSIS — M47819 Spondylosis without myelopathy or radiculopathy, site unspecified: Secondary | ICD-10-CM

## 2022-04-04 NOTE — Progress Notes (Signed)
Spoke with patient via telephone, he reports greater than 80% relief with second set of diagnostic facet joint injections. We will proceed with radiofrequency ablation procedure.

## 2022-04-04 NOTE — Telephone Encounter (Signed)
Attempted to call patient to clarify pain diary, no answer, did leave voicemail to call us back.

## 2022-04-18 ENCOUNTER — Encounter: Payer: Self-pay | Admitting: Podiatry

## 2022-04-18 ENCOUNTER — Ambulatory Visit (INDEPENDENT_AMBULATORY_CARE_PROVIDER_SITE_OTHER): Payer: Medicare Other | Admitting: Podiatry

## 2022-04-18 DIAGNOSIS — E0843 Diabetes mellitus due to underlying condition with diabetic autonomic (poly)neuropathy: Secondary | ICD-10-CM | POA: Diagnosis not present

## 2022-04-18 DIAGNOSIS — M79674 Pain in right toe(s): Secondary | ICD-10-CM

## 2022-04-18 DIAGNOSIS — M79675 Pain in left toe(s): Secondary | ICD-10-CM

## 2022-04-18 DIAGNOSIS — B351 Tinea unguium: Secondary | ICD-10-CM

## 2022-04-18 NOTE — Progress Notes (Signed)
This patient returns to my office for at risk foot care.  This patient requires this care by a professional since this patient will be at risk due to having  diabetes.  This patient is unable to cut nails himself since the patient cannot reach his nails.These nails are painful walking and wearing shoes.  This patient presents for at risk foot care today.  General Appearance  Alert, conversant and in no acute stress.  Vascular  Dorsalis pedis and posterior tibial  pulses are palpable  bilaterally.  Capillary return is within normal limits  bilaterally. Temperature is within normal limits  bilaterally.  Neurologic  Senn-Weinstein monofilament wire test within normal limits  bilaterally. Muscle power within normal limits bilaterally.  Nails Thick disfigured discolored nails with subungual debris  from hallux to fifth toes bilaterally. No evidence of bacterial infection or drainage bilaterally.  Orthopedic  No limitations of motion  feet .  No crepitus or effusions noted.  No bony pathology or digital deformities noted.  Skin  normotropic skin with no porokeratosis noted bilaterally.  No signs of infections or ulcers noted.     Onychomycosis  Pain in right toes  Pain in left toes  Consent was obtained for treatment procedures.   Mechanical debridement of nails 1-5  bilaterally performed with a nail nipper.  Filed with dremel without incident.    Return office visit   3 months                   Told patient to return for periodic foot care and evaluation due to potential at risk complications.   Rasaan Brotherton DPM   

## 2022-04-26 ENCOUNTER — Ambulatory Visit: Payer: Medicare Other | Admitting: Physical Medicine and Rehabilitation

## 2022-04-26 ENCOUNTER — Encounter: Payer: Self-pay | Admitting: Physical Medicine and Rehabilitation

## 2022-04-26 ENCOUNTER — Ambulatory Visit: Payer: Self-pay

## 2022-04-26 VITALS — BP 140/75 | HR 89

## 2022-04-26 DIAGNOSIS — M47816 Spondylosis without myelopathy or radiculopathy, lumbar region: Secondary | ICD-10-CM

## 2022-04-26 MED ORDER — METHYLPREDNISOLONE ACETATE 80 MG/ML IJ SUSP
80.0000 mg | Freq: Once | INTRAMUSCULAR | Status: AC
  Administered 2022-04-26: 80 mg

## 2022-04-26 NOTE — Progress Notes (Signed)
Pt state lower back pain. Pt state standing and laying down makes the pain worse. Pt state he takes pain meds to help ease his pain.  Numeric Pain Rating Scale and Functional Assessment Average Pain 5   In the last MONTH (on 0-10 scale) has pain interfered with the following?  1. General activity like being  able to carry out your everyday physical activities such as walking, climbing stairs, carrying groceries, or moving a chair?  Rating(10)   +Driver, -BT, -Dye Allergies.

## 2022-04-26 NOTE — Patient Instructions (Signed)

## 2022-05-03 ENCOUNTER — Ambulatory Visit: Payer: Medicare Other | Admitting: Physical Medicine and Rehabilitation

## 2022-05-03 ENCOUNTER — Encounter: Payer: Self-pay | Admitting: Physical Medicine and Rehabilitation

## 2022-05-03 ENCOUNTER — Ambulatory Visit: Payer: Self-pay

## 2022-05-03 VITALS — BP 109/70 | HR 94

## 2022-05-03 DIAGNOSIS — M47816 Spondylosis without myelopathy or radiculopathy, lumbar region: Secondary | ICD-10-CM | POA: Diagnosis not present

## 2022-05-03 MED ORDER — METHYLPREDNISOLONE ACETATE 80 MG/ML IJ SUSP
80.0000 mg | Freq: Once | INTRAMUSCULAR | Status: AC
  Administered 2022-05-03: 80 mg

## 2022-05-03 NOTE — Progress Notes (Signed)
Pt state lower back pain. Pt state standing and laying down makes the pain worse. Pt state he takes pain meds to help ease his pain.    Numeric Pain Rating Scale and Functional Assessment Average Pain 5   In the last MONTH (on 0-10 scale) has pain interfered with the following?  1. General activity like being  able to carry out your everyday physical activities such as walking, climbing stairs, carrying groceries, or moving a chair?  Rating(10)   +Driver, -BT, -Dye Allergies.

## 2022-05-09 NOTE — Progress Notes (Signed)
Joshua [name] - 76 y.o. male MRN [id number]  Date of birth: 1946-03-12  Office Visit Note: Visit Date: 05/03/2022 PCP: Donnajean Lopes, MD Referred by: Donnajean Lopes, MD  Subjective: Chief Complaint  Patient presents with   Lower Back - Pain   HPI:  Joshua [name] is a 76 y.o. male who comes in todayfor planned radiofrequency ablation of the Left L2-3 Lumbar facet joints. This would be ablation of the corresponding medial branches and/or dorsal rami.  Patient has had double diagnostic blocks with more than 50% relief.  These are documented on pain diary.  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  They have no radicular pain.  Their axial pain is worse with standing and ambulating and on exam today with facet loading.  They have had physical therapy as well as home exercise program.  The imaging noted in the chart below indicated facet pathology. Accordingly they meet all the criteria and qualification for for radiofrequency ablation and we are going to complete this today hopefully for more longer term relief as part of comprehensive management program.    ROS Otherwise per HPI.  Assessment & Plan: Visit Diagnoses:    ICD-10-CM   1. Spondylosis without myelopathy or radiculopathy, lumbar region  M47.816 XR C-ARM NO REPORT    Radiofrequency,Lumbar    methylPREDNISolone acetate (DEPO-MEDROL) injection 80 mg      Plan: No additional findings.   Meds & Orders:  Meds ordered this encounter  Medications   methylPREDNISolone acetate (DEPO-MEDROL) injection 80 mg    Orders Placed This Encounter  Procedures   Radiofrequency,Lumbar   XR C-ARM NO REPORT    Follow-up: Return for visit to requesting provider as needed.   Procedures: No procedures performed  Lumbar Facet Joint Nerve Denervation  Patient: Joshua [name]      Date of Birth: 1946-08-08 MRN: [id number] PCP:  Donnajean Lopes, MD      Visit Date: 05/03/2022   Universal Protocol:    Date/Time: 08/30/238:28 PM  Consent Given By: the patient  Position: PRONE  Additional Comments: Vital signs were monitored before and after the procedure. Patient was prepped and draped in the usual sterile fashion. The correct patient, procedure, and site was verified.   Injection Procedure Details:   Procedure diagnoses:  1. Spondylosis without myelopathy or radiculopathy, lumbar region      Meds Administered:  Meds ordered this encounter  Medications   methylPREDNISolone acetate (DEPO-MEDROL) injection 80 mg     Laterality: Left  Location/Site:  L2-L3, L1 and L2 medial branches  Needle: 18 ga.,  92m active tip, 1043mRF Cannula  Needle Placement: Along juncture of superior articular process and transverse pocess  Findings:  -Comments:  Procedure Details: For each desired target nerve, the corresponding transverse process (sacral ala for the L5 dorsal rami) was identified and the fluoroscope was positioned to square off the endplates of the corresponding vertebral body to achieve a true AP midline view.  The beam was then obliqued 15 to 20 degrees and caudally tilted 15 to 20 degrees to line up a trajectory along the target nerves. The skin over the target of the junction of superior articulating process and transverse process (sacral ala for the L5 dorsal rami) was infiltrated with 32m55mf 1% Lidocaine without Epinephrine.  The 18 gauge 37m34mtive tip outer cannula was advanced in trajectory view  to the target.  This procedure was repeated for each target nerve.  Then, for all levels, the outer cannula placement was fine-tuned and the position was then confirmed with bi-planar imaging.    Test stimulation was done both at sensory and motor levels to ensure there was no radicular stimulation. The target tissues were then infiltrated with 1 ml of 1% Lidocaine without Epinephrine. Subsequently, a  percutaneous neurotomy was carried out for 90 seconds at 80 degrees Celsius.  After the completion of the lesion, 1 ml of injectate was delivered. It was then repeated for each facet joint nerve mentioned above. Appropriate radiographs were obtained to verify the probe placement during the neurotomy.   Additional Comments:  The patient tolerated the procedure well Dressing: 2 x 2 sterile gauze and Band-Aid    Post-procedure details: Patient was observed during the procedure. Post-procedure instructions were reviewed.  Patient left the clinic in stable condition.      Clinical History: No specialty comments available.     Objective:  VS:  HT:    WT:   BMI:     BP:109/70  HR:94bpm  TEMP: ( )  RESP:  Physical Exam Vitals and nursing note reviewed.  Constitutional:      General: He is not in acute distress.    Appearance: Normal appearance. He is not ill-appearing.  HENT:     Head: Normocephalic and atraumatic.     Right Ear: External ear normal.     Left Ear: External ear normal.     Nose: No congestion.  Eyes:     Extraocular Movements: Extraocular movements intact.  Cardiovascular:     Rate and Rhythm: Normal rate.     Pulses: Normal pulses.  Pulmonary:     Effort: Pulmonary effort is normal. No respiratory distress.  Abdominal:     General: There is no distension.     Palpations: Abdomen is soft.  Musculoskeletal:        General: No tenderness or signs of injury.     Cervical back: Neck supple.     Right lower leg: No edema.     Left lower leg: No edema.     Comments: Patient has good distal strength without clonus.  Skin:    Findings: No erythema or rash.  Neurological:     General: No focal deficit present.     Mental Status: He is alert and oriented to person, place, and time.     Sensory: No sensory deficit.     Motor: No weakness or abnormal muscle tone.     Coordination: Coordination normal.  Psychiatric:        Mood and Affect: Mood normal.         Behavior: Behavior normal.      Imaging: No results found.

## 2022-05-09 NOTE — Progress Notes (Signed)
DASEAN [name] - 76 y.o. male MRN [id number]  Date of birth: Oct 20, 1945  Office Visit Note: Visit Date: 04/26/2022 PCP: Donnajean Lopes, MD Referred by: Donnajean Lopes, MD  Subjective: Chief Complaint  Patient presents with   Lower Back - Pain   HPI:  Joshua [name] is a 76 y.o. male who comes in todayfor planned radiofrequency ablation of the Right L2-3 Lumbar facet joints. This would be ablation of the corresponding medial branches and/or dorsal rami.  Patient has had double diagnostic blocks with more than 50% relief.  These are documented on pain diary.  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  They have no radicular pain.  Their axial pain is worse with standing and ambulating and on exam today with facet loading.  They have had physical therapy as well as home exercise program.  The imaging noted in the chart below indicated facet pathology. Accordingly they meet all the criteria and qualification for for radiofrequency ablation and we are going to complete this today hopefully for more longer term relief as part of comprehensive management program.   ROS Otherwise per HPI.  Assessment & Plan: Visit Diagnoses:    ICD-10-CM   1. Spondylosis without myelopathy or radiculopathy, lumbar region  M47.816 XR C-ARM NO REPORT    Radiofrequency,Lumbar    methylPREDNISolone acetate (DEPO-MEDROL) injection 80 mg      Plan: No additional findings.   Meds & Orders:  Meds ordered this encounter  Medications   methylPREDNISolone acetate (DEPO-MEDROL) injection 80 mg    Orders Placed This Encounter  Procedures   Radiofrequency,Lumbar   XR C-ARM NO REPORT    Follow-up: Return for visit to requesting provider as needed.   Procedures: No procedures performed  Lumbar Facet Joint Nerve Denervation  Patient: Joshua [name]      Date of Birth: 02/07/1946 MRN: [id number] PCP:  Donnajean Lopes, MD      Visit Date: 04/26/2022   Universal Protocol:    Date/Time: 08/30/238:25 PM  Consent Given By: the patient  Position: PRONE  Additional Comments: Vital signs were monitored before and after the procedure. Patient was prepped and draped in the usual sterile fashion. The correct patient, procedure, and site was verified.   Injection Procedure Details:   Procedure diagnoses:  1. Spondylosis without myelopathy or radiculopathy, lumbar region      Meds Administered:  Meds ordered this encounter  Medications   methylPREDNISolone acetate (DEPO-MEDROL) injection 80 mg     Laterality: Right  Location/Site:  L2-L3, L1 and L2 medial branches  Needle: 18 ga.,  43m active tip, 1068mRF Cannula  Needle Placement: Along juncture of superior articular process and transverse pocess  Findings:  -Comments:  Procedure Details: For each desired target nerve, the corresponding transverse process (sacral ala for the L5 dorsal rami) was identified and the fluoroscope was positioned to square off the endplates of the corresponding vertebral body to achieve a true AP midline view.  The beam was then obliqued 15 to 20 degrees and caudally tilted 15 to 20 degrees to line up a trajectory along the target nerves. The skin over the target of the junction of superior articulating process and transverse process (sacral ala for the L5 dorsal rami) was infiltrated with 30m57mf 1% Lidocaine without Epinephrine.  The 18 gauge 75m39mtive tip outer cannula was advanced in trajectory view to  the target.  This procedure was repeated for each target nerve.  Then, for all levels, the outer cannula placement was fine-tuned and the position was then confirmed with bi-planar imaging.    Test stimulation was done both at sensory and motor levels to ensure there was no radicular stimulation. The target tissues were then infiltrated with 1 ml of 1% Lidocaine without Epinephrine. Subsequently,  a percutaneous neurotomy was carried out for 90 seconds at 80 degrees Celsius.  After the completion of the lesion, 1 ml of injectate was delivered. It was then repeated for each facet joint nerve mentioned above. Appropriate radiographs were obtained to verify the probe placement during the neurotomy.   Additional Comments:  The patient tolerated the procedure well Dressing: 2 x 2 sterile gauze and Band-Aid    Post-procedure details: Patient was observed during the procedure. Post-procedure instructions were reviewed.  Patient left the clinic in stable condition.      Clinical History: No specialty comments available.     Objective:  VS:  HT:    WT:   BMI:     BP:(!) 140/75  HR:89bpm  TEMP: ( )  RESP:  Physical Exam Vitals and nursing note reviewed.  Constitutional:      General: He is not in acute distress.    Appearance: Normal appearance. He is obese. He is not ill-appearing.  HENT:     Head: Normocephalic and atraumatic.     Right Ear: External ear normal.     Left Ear: External ear normal.     Nose: No congestion.  Eyes:     Extraocular Movements: Extraocular movements intact.  Cardiovascular:     Rate and Rhythm: Normal rate.     Pulses: Normal pulses.  Pulmonary:     Effort: Pulmonary effort is normal. No respiratory distress.  Abdominal:     General: There is no distension.     Palpations: Abdomen is soft.  Musculoskeletal:        General: No tenderness or signs of injury.     Cervical back: Neck supple.     Right lower leg: No edema.     Left lower leg: No edema.     Comments: Patient has good distal strength without clonus.  Skin:    Findings: No erythema or rash.  Neurological:     General: No focal deficit present.     Mental Status: He is alert and oriented to person, place, and time.     Sensory: No sensory deficit.     Motor: No weakness or abnormal muscle tone.     Coordination: Coordination normal.  Psychiatric:        Mood and Affect:  Mood normal.        Behavior: Behavior normal.      Imaging: No results found.

## 2022-05-09 NOTE — Procedures (Signed)
Lumbar Facet Joint Nerve Denervation  Patient: Joshua [name]      Date of Birth: 05-09-1946 MRN: [id number] PCP: Donnajean Lopes, MD      Visit Date: 05/03/2022   Universal Protocol:    Date/Time: 08/30/238:28 PM  Consent Given By: the patient  Position: PRONE  Additional Comments: Vital signs were monitored before and after the procedure. Patient was prepped and draped in the usual sterile fashion. The correct patient, procedure, and site was verified.   Injection Procedure Details:   Procedure diagnoses:  1. Spondylosis without myelopathy or radiculopathy, lumbar region      Meds Administered:  Meds ordered this encounter  Medications   methylPREDNISolone acetate (DEPO-MEDROL) injection 80 mg     Laterality: Left  Location/Site:  L2-L3, L1 and L2 medial branches  Needle: 18 ga.,  92m active tip, 1023mRF Cannula  Needle Placement: Along juncture of superior articular process and transverse pocess  Findings:  -Comments:  Procedure Details: For each desired target nerve, the corresponding transverse process (sacral ala for the L5 dorsal rami) was identified and the fluoroscope was positioned to square off the endplates of the corresponding vertebral body to achieve a true AP midline view.  The beam was then obliqued 15 to 20 degrees and caudally tilted 15 to 20 degrees to line up a trajectory along the target nerves. The skin over the target of the junction of superior articulating process and transverse process (sacral ala for the L5 dorsal rami) was infiltrated with 70m47mf 1% Lidocaine without Epinephrine.  The 18 gauge 76m670mtive tip outer cannula was advanced in trajectory view to the target.  This procedure was repeated for each target nerve.  Then, for all levels, the outer cannula placement was fine-tuned and the position was then confirmed with bi-planar imaging.    Test stimulation was done both at sensory and motor levels to ensure there was no radicular  stimulation. The target tissues were then infiltrated with 1 ml of 1% Lidocaine without Epinephrine. Subsequently, a percutaneous neurotomy was carried out for 90 seconds at 80 degrees Celsius.  After the completion of the lesion, 1 ml of injectate was delivered. It was then repeated for each facet joint nerve mentioned above. Appropriate radiographs were obtained to verify the probe placement during the neurotomy.   Additional Comments:  The patient tolerated the procedure well Dressing: 2 x 2 sterile gauze and Band-Aid    Post-procedure details: Patient was observed during the procedure. Post-procedure instructions were reviewed.  Patient left the clinic in stable condition.

## 2022-05-09 NOTE — Procedures (Signed)
Lumbar Facet Joint Nerve Denervation  Patient: Joshua [name]      Date of Birth: Jun 23, 1946 MRN: [id number] PCP: Donnajean Lopes, MD      Visit Date: 04/26/2022   Universal Protocol:    Date/Time: 08/30/238:25 PM  Consent Given By: the patient  Position: PRONE  Additional Comments: Vital signs were monitored before and after the procedure. Patient was prepped and draped in the usual sterile fashion. The correct patient, procedure, and site was verified.   Injection Procedure Details:   Procedure diagnoses:  1. Spondylosis without myelopathy or radiculopathy, lumbar region      Meds Administered:  Meds ordered this encounter  Medications   methylPREDNISolone acetate (DEPO-MEDROL) injection 80 mg     Laterality: Right  Location/Site:  L2-L3, L1 and L2 medial branches  Needle: 18 ga.,  64m active tip, 1033mRF Cannula  Needle Placement: Along juncture of superior articular process and transverse pocess  Findings:  -Comments:  Procedure Details: For each desired target nerve, the corresponding transverse process (sacral ala for the L5 dorsal rami) was identified and the fluoroscope was positioned to square off the endplates of the corresponding vertebral body to achieve a true AP midline view.  The beam was then obliqued 15 to 20 degrees and caudally tilted 15 to 20 degrees to line up a trajectory along the target nerves. The skin over the target of the junction of superior articulating process and transverse process (sacral ala for the L5 dorsal rami) was infiltrated with 41m67mf 1% Lidocaine without Epinephrine.  The 18 gauge 28m50mtive tip outer cannula was advanced in trajectory view to the target.  This procedure was repeated for each target nerve.  Then, for all levels, the outer cannula placement was fine-tuned and the position was then confirmed with bi-planar imaging.    Test stimulation was done both at sensory and motor levels to ensure there was no radicular  stimulation. The target tissues were then infiltrated with 1 ml of 1% Lidocaine without Epinephrine. Subsequently, a percutaneous neurotomy was carried out for 90 seconds at 80 degrees Celsius.  After the completion of the lesion, 1 ml of injectate was delivered. It was then repeated for each facet joint nerve mentioned above. Appropriate radiographs were obtained to verify the probe placement during the neurotomy.   Additional Comments:  The patient tolerated the procedure well Dressing: 2 x 2 sterile gauze and Band-Aid    Post-procedure details: Patient was observed during the procedure. Post-procedure instructions were reviewed.  Patient left the clinic in stable condition.

## 2022-07-06 ENCOUNTER — Encounter: Payer: Self-pay | Admitting: Internal Medicine

## 2022-07-18 ENCOUNTER — Ambulatory Visit: Payer: Medicare Other | Admitting: Podiatry

## 2022-07-20 ENCOUNTER — Ambulatory Visit: Payer: Medicare Other | Admitting: Podiatry

## 2022-07-20 ENCOUNTER — Encounter: Payer: Self-pay | Admitting: Podiatry

## 2022-07-20 DIAGNOSIS — B351 Tinea unguium: Secondary | ICD-10-CM | POA: Diagnosis not present

## 2022-07-20 DIAGNOSIS — E0843 Diabetes mellitus due to underlying condition with diabetic autonomic (poly)neuropathy: Secondary | ICD-10-CM

## 2022-07-20 DIAGNOSIS — M79675 Pain in left toe(s): Secondary | ICD-10-CM | POA: Diagnosis not present

## 2022-07-20 DIAGNOSIS — M79674 Pain in right toe(s): Secondary | ICD-10-CM

## 2022-07-20 NOTE — Progress Notes (Signed)
This patient returns to my office for at risk foot care.  This patient requires this care by a professional since this patient will be at risk due to having  diabetes.  This patient is unable to cut nails himself since the patient cannot reach his nails.These nails are painful walking and wearing shoes.  This patient presents for at risk foot care today.  General Appearance  Alert, conversant and in no acute stress.  Vascular  Dorsalis pedis and posterior tibial  pulses are palpable  bilaterally.  Capillary return is within normal limits  bilaterally. Temperature is within normal limits  bilaterally.  Neurologic  Senn-Weinstein monofilament wire test within normal limits  bilaterally. Muscle power within normal limits bilaterally.  Nails Thick disfigured discolored nails with subungual debris  from hallux to fifth toes bilaterally. No evidence of bacterial infection or drainage bilaterally.  Orthopedic  No limitations of motion  feet .  No crepitus or effusions noted.  No bony pathology or digital deformities noted.  Skin  normotropic skin with no porokeratosis noted bilaterally.  No signs of infections or ulcers noted.     Onychomycosis  Pain in right toes  Pain in left toes  Consent was obtained for treatment procedures.   Mechanical debridement of nails 1-5  bilaterally performed with a nail nipper.  Filed with dremel without incident.    Return office visit   3 months                   Told patient to return for periodic foot care and evaluation due to potential at risk complications.   Issam Carlyon DPM   

## 2022-08-03 ENCOUNTER — Other Ambulatory Visit: Payer: Self-pay | Admitting: Neurology

## 2022-10-17 ENCOUNTER — Encounter: Payer: Self-pay | Admitting: Podiatry

## 2022-10-17 ENCOUNTER — Ambulatory Visit: Payer: Medicare Other | Admitting: Podiatry

## 2022-10-17 VITALS — BP 113/58 | HR 72

## 2022-10-17 DIAGNOSIS — E0843 Diabetes mellitus due to underlying condition with diabetic autonomic (poly)neuropathy: Secondary | ICD-10-CM

## 2022-10-17 DIAGNOSIS — M79675 Pain in left toe(s): Secondary | ICD-10-CM | POA: Diagnosis not present

## 2022-10-17 DIAGNOSIS — M79674 Pain in right toe(s): Secondary | ICD-10-CM | POA: Diagnosis not present

## 2022-10-17 DIAGNOSIS — B351 Tinea unguium: Secondary | ICD-10-CM | POA: Diagnosis not present

## 2022-10-17 NOTE — Progress Notes (Signed)
This patient returns to my office for at risk foot care.  This patient requires this care by a professional since this patient will be at risk due to having  diabetes.  This patient is unable to cut nails himself since the patient cannot reach his nails.These nails are painful walking and wearing shoes.  This patient presents for at risk foot care today.  General Appearance  Alert, conversant and in no acute stress.  Vascular  Dorsalis pedis and posterior tibial  pulses are palpable  bilaterally.  Capillary return is within normal limits  bilaterally. Temperature is within normal limits  bilaterally.  Neurologic  Senn-Weinstein monofilament wire test within normal limits  bilaterally. Muscle power within normal limits bilaterally.  Nails Thick disfigured discolored nails with subungual debris  from hallux to fifth toes bilaterally. No evidence of bacterial infection or drainage bilaterally.  Orthopedic  No limitations of motion  feet .  No crepitus or effusions noted.  No bony pathology or digital deformities noted.  Skin  normotropic skin with no porokeratosis noted bilaterally.  No signs of infections or ulcers noted.     Onychomycosis  Pain in right toes  Pain in left toes  Consent was obtained for treatment procedures.   Mechanical debridement of nails 1-5  bilaterally performed with a nail nipper.  Filed with dremel without incident.    Return office visit   3 months                   Told patient to return for periodic foot care and evaluation due to potential at risk complications.   Leonardo Makris DPM   

## 2022-10-22 ENCOUNTER — Encounter: Payer: Self-pay | Admitting: Neurology

## 2022-10-22 ENCOUNTER — Ambulatory Visit: Payer: Medicare Other | Admitting: Neurology

## 2022-10-22 ENCOUNTER — Other Ambulatory Visit (INDEPENDENT_AMBULATORY_CARE_PROVIDER_SITE_OTHER): Payer: Medicare Other

## 2022-10-22 VITALS — BP 135/76 | HR 88 | Ht 68.0 in | Wt 267.0 lb

## 2022-10-22 DIAGNOSIS — E538 Deficiency of other specified B group vitamins: Secondary | ICD-10-CM

## 2022-10-22 DIAGNOSIS — G7000 Myasthenia gravis without (acute) exacerbation: Secondary | ICD-10-CM | POA: Diagnosis not present

## 2022-10-22 MED ORDER — MYCOPHENOLATE MOFETIL 500 MG PO TABS: 500.0000 mg | ORAL_TABLET | Freq: Two times a day (BID) | ORAL | 3 refills | Status: DC

## 2022-10-22 NOTE — Progress Notes (Signed)
Follow-up Visit   Date: 10/22/22   Joshua [name] MRN: [id number] DOB: 10/27/1945   Interim History: Joshua [name] is a 77 y.o. right-handed Caucasian male with insulin-dependent diabetes mellitus, hypertension, hyperlipidemia, adenocarcinoma of the colon s/p right hemicolectomy, and chronic low back pain returning to the clinic for follow-up of myasthenia gravis.  The patient was accompanied to the clinic by self.  History of present illness: He was diagnosed in 2012 after presenting with dysphagia, ptosis, and difficulty with chewing.  He did not have limb weakness or double vision. He was hospitalized and treated with IVIG after which, he was started on prednisone 39m and Cellcept. He was seeing Dr. LErling Cruzand then [facility]  He was previously taking 10049mtwice daily, which has been slowly tapered.   He has not been hospitalized since his diagnosis.  He is currently taking mestinon 309mwice daily Cellcept 500m2mice daily.  He has mild right ptosis, no double vision, difficulty swallowing/talking, or limb weakness.  He has not had an exacerbation since 2012 with his initial diagnosis.  He does not know if he is antibody positive.    UPDATE 10/31/2022:  He is here for 1 year follow-up visit.  He has excellent compliance with Cellcept 500mg9m.  He denies any double vision, droopy eyelids, slurred speech, or limb weakness.  Rarely, he can have difficulty swallowing, but usually with dry foods like bread.  He has not needed to take any mestinon in the past year.  No new complaints.   Medications:  Current Outpatient Medications on File Prior to Visit  Medication Sig Dispense Refill   atorvastatin (LIPITOR) 40 MG tablet Take 40 mg by mouth every evening.     B-D ULTRAFINE III SHORT PEN 31G X 8 MM MISC 200 each by Other route 3 (three) times daily between meals.     busPIRone (BUSPAR) 7.5 MG tablet TAKE 1 TABLET BY MOUTH TWICE DAILY 180 tablet 1   Calcium  Carb-Cholecalciferol (CALCIUM + D3 PO) Take 1 tablet by mouth daily before supper.     cetirizine (ZYRTEC) 10 MG tablet Take 10 mg by mouth daily as needed for allergies.     Cyanocobalamin (VITAMIN B-12) 2500 MCG SUBL Place 2,500 mcg under the tongue in the morning.     diclofenac Sodium (VOLTAREN) 1 % GEL Apply topically as needed.     Eyelid Cleansers (STERILID EX) Apply 1 application to eye daily as needed (eyelid inflammation).     famotidine (PEPCID) 20 MG tablet Take 20 mg by mouth 2 (two) times daily.     FARXIGA 10 MG TABS tablet Take 10 mg by mouth in the morning.     fluticasone (FLONASE) 50 MCG/ACT nasal spray Place 1 spray into both nostrils 2 (two) times daily as needed for allergies.     gabapentin (NEURONTIN) 300 MG capsule Take 600 mg by mouth at bedtime.  3   HUMALOG MIX 75/25 KWIKPEN (75-25) 100 UNIT/ML KwikPen Inject 42 Units into the skin in the morning and at bedtime.     ketoconazole (NIZORAL) 2 % shampoo Apply 1 application topically once a week.  5   Lidocaine 4 % PTCH Place 1 patch onto the skin daily as needed (back pain.).     lisinopril (PRINIVIL,ZESTRIL) 5 MG tablet Take 5 mg by mouth in the morning.     metFORMIN (GLUCOPHAGE) 1000 MG tablet Take 1,000 mg by mouth at bedtime.  Multiple Vitamins-Minerals (OCUVITE PRESERVISION PO) Take 1 tablet by mouth in the morning.     mycophenolate (CELLCEPT) 500 MG tablet TAKE 1 TABLET BY MOUTH TWICE  DAILY 200 tablet 2   ONE TOUCH ULTRA TEST test strip 200 each by Other route 3 (three) times daily.     ONETOUCH DELICA LANCETS 99991111 MISC 100 each by Other route 3 (three) times daily.     Polyethyl Glycol-Propyl Glycol (LUBRICANT EYE DROPS) 0.4-0.3 % SOLN Place 1 drop into both eyes 3 (three) times daily as needed (dry/irritated eyes.).     pyridostigmine (MESTINON) 60 MG tablet TAKE ONE-HALF TABLET BY  MOUTH TWICE DAILY OK TO  TAKE AN EXTRA DOSE AS  NEEDED 200 tablet 3   saccharomyces boulardii (FLORASTOR) 250 MG capsule Take  250 mg by mouth in the morning.     sulindac (CLINORIL) 150 MG tablet Take 150 mg by mouth daily as needed (pain/inflammation).     traMADol (ULTRAM) 50 MG tablet 2 Tablets Orally three times daily for 30 days     cyanocobalamin (,VITAMIN B-12,) 1000 MCG/ML injection Inject 1,000 mcg into the muscle as directed. Receives injection every other month (Patient not taking: Reported on 10/22/2022)     No current facility-administered medications on file prior to visit.    Allergies:  Allergies  Allergen Reactions   Aluminum Chlorohydrate Rash   Niacin And Related Other (See Comments)    flushing    Vital Signs:  BP 135/76   Pulse 88   Ht 5' 8"$  (1.727 m)   Wt 267 lb (121.1 kg)   SpO2 98%   BMI 40.60 kg/m    Neurological Exam: MENTAL STATUS including orientation to time, place, person, recent and remote memory, attention span and concentration, language, and fund of knowledge is normal.  Speech is not dysarthric.  CRANIAL NERVES:  No visual field defects.  Pupils equal round and reactive to light.  Normal conjugate, extra-ocular eye movements in all directions of gaze.  No ptosis.  Face is symmetric. Facial muscles are 5/5   MOTOR:  Motor strength is 5/5 in all extremities.  No atrophy, fasciculations or abnormal movements.  No pronator drift.  Tone is normal.    COORDINATION/GAIT:  Gait is wide-based, unassisted and stable.  Data: n/a  IMPRESSION/PLAN: Myasthenia gravis without  exacerbation (dx 2012 with bulbar symptoms). No evidence of disease manifestation  - Continue Cellcept 542m BID  - Okay to take Mestinon 30 mg daily as needed  - Check CBC and CMP  2.  Vitamin B12 deficiency  - Check vitamin B12  - OK to take B12 1002m daily  Return to clinic in 1 year.   Thank you for allowing me to participate in patient's care.  If I can answer any additional questions, I would be pleased to do so.    Sincerely,    Elizabth Palka K. PaPosey ProntoDO

## 2022-10-22 NOTE — Patient Instructions (Signed)
Check labs  Continue Cellcept 589m twice daily  OK to use mestinon 347mas needed  Return to clinic in 1 year

## 2022-10-24 LAB — CBC
Hematocrit: 38.9 % (ref 37.5–51.0)
Hemoglobin: 13.1 g/dL (ref 13.0–17.7)
MCH: 32.3 pg (ref 26.6–33.0)
MCHC: 33.7 g/dL (ref 31.5–35.7)
MCV: 96 fL (ref 79–97)
Platelets: 297 10*3/uL (ref 150–450)
RBC: 4.06 x10E6/uL — ABNORMAL LOW (ref 4.14–5.80)
RDW: 12.4 % (ref 11.6–15.4)
WBC: 7.9 10*3/uL (ref 3.4–10.8)

## 2022-10-24 LAB — COMPREHENSIVE METABOLIC PANEL
ALT: 10 IU/L (ref 0–44)
AST: 11 IU/L (ref 0–40)
Albumin/Globulin Ratio: 2.5 — ABNORMAL HIGH (ref 1.2–2.2)
Albumin: 4.2 g/dL (ref 3.8–4.8)
Alkaline Phosphatase: 96 IU/L (ref 44–121)
BUN/Creatinine Ratio: 18 (ref 10–24)
BUN: 21 mg/dL (ref 8–27)
Bilirubin Total: 0.4 mg/dL (ref 0.0–1.2)
CO2: 20 mmol/L (ref 20–29)
Calcium: 9.1 mg/dL (ref 8.6–10.2)
Chloride: 107 mmol/L — ABNORMAL HIGH (ref 96–106)
Creatinine, Ser: 1.17 mg/dL (ref 0.76–1.27)
Globulin, Total: 1.7 g/dL (ref 1.5–4.5)
Glucose: 144 mg/dL — ABNORMAL HIGH (ref 70–99)
Potassium: 4.4 mmol/L (ref 3.5–5.2)
Sodium: 145 mmol/L — ABNORMAL HIGH (ref 134–144)
Total Protein: 5.9 g/dL — ABNORMAL LOW (ref 6.0–8.5)
eGFR: 65 mL/min/{1.73_m2} (ref >59)

## 2022-10-24 LAB — VITAMIN B12: Vitamin B-12: 1066 pg/mL (ref 232–1245)

## 2023-01-15 ENCOUNTER — Ambulatory Visit: Payer: Medicare Other | Admitting: Podiatry

## 2023-01-15 ENCOUNTER — Encounter: Payer: Self-pay | Admitting: Podiatry

## 2023-01-15 DIAGNOSIS — M79675 Pain in left toe(s): Secondary | ICD-10-CM

## 2023-01-15 DIAGNOSIS — E0843 Diabetes mellitus due to underlying condition with diabetic autonomic (poly)neuropathy: Secondary | ICD-10-CM

## 2023-01-15 DIAGNOSIS — B351 Tinea unguium: Secondary | ICD-10-CM

## 2023-01-15 DIAGNOSIS — M79674 Pain in right toe(s): Secondary | ICD-10-CM | POA: Diagnosis not present

## 2023-01-15 NOTE — Progress Notes (Signed)
This patient returns to my office for at risk foot care.  This patient requires this care by a professional since this patient will be at risk due to having  diabetes.  This patient is unable to cut nails himself since the patient cannot reach his nails.These nails are painful walking and wearing shoes.  This patient presents for at risk foot care today.  General Appearance  Alert, conversant and in no acute stress.  Vascular  Dorsalis pedis and posterior tibial  pulses are palpable  bilaterally.  Capillary return is within normal limits  bilaterally. Temperature is within normal limits  bilaterally.  Neurologic  Senn-Weinstein monofilament wire test within normal limits  bilaterally. Muscle power within normal limits bilaterally.  Nails Thick disfigured discolored nails with subungual debris  from hallux to fifth toes bilaterally. No evidence of bacterial infection or drainage bilaterally.  Orthopedic  No limitations of motion  feet .  No crepitus or effusions noted.  No bony pathology or digital deformities noted.  Skin  normotropic skin with no porokeratosis noted bilaterally.  No signs of infections or ulcers noted.     Onychomycosis  Pain in right toes  Pain in left toes  Consent was obtained for treatment procedures.   Mechanical debridement of nails 1-5  bilaterally performed with a nail nipper.  Filed with dremel without incident.    Return office visit   3 months                   Told patient to return for periodic foot care and evaluation due to potential at risk complications.   Issam Carlyon DPM   

## 2023-04-18 ENCOUNTER — Ambulatory Visit: Payer: Medicare Other | Admitting: Podiatry

## 2023-04-18 ENCOUNTER — Encounter: Payer: Self-pay | Admitting: Podiatry

## 2023-04-18 DIAGNOSIS — M79674 Pain in right toe(s): Secondary | ICD-10-CM

## 2023-04-18 DIAGNOSIS — E0843 Diabetes mellitus due to underlying condition with diabetic autonomic (poly)neuropathy: Secondary | ICD-10-CM

## 2023-04-18 DIAGNOSIS — M79675 Pain in left toe(s): Secondary | ICD-10-CM | POA: Diagnosis not present

## 2023-04-18 DIAGNOSIS — B351 Tinea unguium: Secondary | ICD-10-CM | POA: Diagnosis not present

## 2023-04-18 NOTE — Progress Notes (Signed)

## 2023-07-22 ENCOUNTER — Ambulatory Visit: Payer: Medicare Other | Admitting: Podiatry

## 2023-07-22 ENCOUNTER — Encounter: Payer: Self-pay | Admitting: Podiatry

## 2023-07-22 DIAGNOSIS — M79674 Pain in right toe(s): Secondary | ICD-10-CM | POA: Diagnosis not present

## 2023-07-22 DIAGNOSIS — B351 Tinea unguium: Secondary | ICD-10-CM

## 2023-07-22 DIAGNOSIS — M79675 Pain in left toe(s): Secondary | ICD-10-CM

## 2023-07-22 DIAGNOSIS — E0843 Diabetes mellitus due to underlying condition with diabetic autonomic (poly)neuropathy: Secondary | ICD-10-CM

## 2023-07-22 NOTE — Progress Notes (Signed)

## 2023-09-19 ENCOUNTER — Encounter: Payer: Self-pay | Admitting: Internal Medicine

## 2023-09-19 ENCOUNTER — Other Ambulatory Visit: Payer: Self-pay | Admitting: Internal Medicine

## 2023-09-19 DIAGNOSIS — F17210 Nicotine dependence, cigarettes, uncomplicated: Secondary | ICD-10-CM

## 2023-09-21 ENCOUNTER — Other Ambulatory Visit: Payer: Self-pay | Admitting: Neurology

## 2023-09-24 ENCOUNTER — Encounter: Payer: Self-pay | Admitting: Orthopedic Surgery

## 2023-09-24 ENCOUNTER — Ambulatory Visit: Payer: Medicare Other | Admitting: Orthopedic Surgery

## 2023-09-24 ENCOUNTER — Other Ambulatory Visit (INDEPENDENT_AMBULATORY_CARE_PROVIDER_SITE_OTHER): Payer: Medicare Other

## 2023-09-24 DIAGNOSIS — L97921 Non-pressure chronic ulcer of unspecified part of left lower leg limited to breakdown of skin: Secondary | ICD-10-CM

## 2023-09-24 DIAGNOSIS — G8929 Other chronic pain: Secondary | ICD-10-CM

## 2023-09-24 DIAGNOSIS — M1712 Unilateral primary osteoarthritis, left knee: Secondary | ICD-10-CM | POA: Diagnosis not present

## 2023-09-24 DIAGNOSIS — M25562 Pain in left knee: Secondary | ICD-10-CM | POA: Diagnosis not present

## 2023-09-24 MED ORDER — METHYLPREDNISOLONE ACETATE 40 MG/ML IJ SUSP
40.0000 mg | INTRAMUSCULAR | Status: AC | PRN
  Administered 2023-09-24: 40 mg via INTRA_ARTICULAR

## 2023-09-24 MED ORDER — LIDOCAINE HCL (PF) 1 % IJ SOLN
5.0000 mL | INTRAMUSCULAR | Status: AC | PRN
  Administered 2023-09-24: 5 mL

## 2023-09-24 NOTE — Progress Notes (Signed)
 Office Visit Note   Patient: Joshua [name]           Date of Birth: 02/23/1946           MRN: [id number] Visit Date: 09/24/2023              Requested by: Yolande Toribio MATSU, MD [address] [place],  [place] [address] PCP: Yolande Toribio MATSU, MD  Chief Complaint  Patient presents with   Left Leg - Wound Check   Right Knee - Pain   Left Knee - Pain      HPI: Patient is a 78 year old gentleman is seen for initial evaluation for 2 separate issues.  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  Assessment & Plan: Visit Diagnoses:  1. Chronic pain of left knee   2. Unilateral primary osteoarthritis, left knee   3. Ulcer of left lower extremity, limited to breakdown of skin (HCC)     Plan: Recommended knee-high compression socks size extra-large.  Discussed that if the venous ulcer of the left leg does not heal in several weeks he should follow-up and we will evaluate for application of Kerecis tissue graft.  Left knee was injected follow-up as needed for the traumatic arthritis.  Follow-Up Instructions: Return if symptoms worsen or fail to improve.   Ortho Exam  Patient is alert, oriented, no adenopathy, well-dressed, normal affect, normal respiratory effort. Examination patient ambulates with a cane with a stiff knee gait.  He has good dorsalis pedis pulse there is brawny edema of the left leg with a venous insufficiency ulcer.  The calf measures 42 cm in circumference.  Patient has crepitation with range of motion of the patellofemoral joint medial lateral joint lines are also tender to palpation.  Collaterals and cruciates are stable.  Imaging: No results found. No images are attached to the encounter.  Labs: Lab Results  Component Value Date   HGBA1C 7.3 (H) 04/22/2015   HGBA1C 7.8 (H) 05/30/2011   HGBA1C 7.5 (H)  05/20/2011   REPTSTATUS 05/29/2011 FINAL 05/22/2011   GRAMSTAIN  05/22/2011    RARE WBC PRESENT, PREDOMINANTLY PMN NO ORGANISMS SEEN   CULT NO GROWTH 5 DAYS 05/22/2011     Lab Results  Component Value Date   ALBUMIN 4.2 10/22/2022   ALBUMIN 3.8 10/20/2020   ALBUMIN 3.8 10/06/2018    Lab Results  Component Value Date   MG 2.1 06/01/2011   MG 2.2 05/31/2011   MG 2.2 05/30/2011   No results found for: VD25OH  No results found for: PREALBUMIN    Latest Ref Rng & Units 10/22/2022    4:12 PM 10/20/2021    4:31 PM 10/20/2020   11:23 AM  CBC EXTENDED  WBC 3.4 - 10.8 x10E3/uL 7.9  8.5  9.1   RBC 4.14 - 5.80 x10E6/uL 4.06  4.10  4.11   Hemoglobin 13.0 - 17.7 g/dL 86.8  86.6  86.6   HCT 37.5 - 51.0 % 38.9  38.7  39.5   Platelets 150 - 450 x10E3/uL 297  280  294.0   NEUT# 1,500 - 7,800 cells/uL  5,797    Lymph# 850 - 3,900 cells/uL  1,726       There is no height or weight on file to calculate BMI.  Orders:  Orders Placed This Encounter  Procedures   XR Knee 1-2 Views  Left   No orders of the defined types were placed in this encounter.    Procedures: Large Joint Inj: L knee on 09/24/2023 5:06 PM Indications: pain and diagnostic evaluation Details: 22 G 1.5 in needle, anteromedial approach  Arthrogram: No  Medications: 5 mL lidocaine  (PF) 1 %; 40 mg methylPREDNISolone  acetate 40 MG/ML Outcome: tolerated well, no immediate complications Procedure, treatment alternatives, risks and benefits explained, specific risks discussed. Consent was given by the patient. Immediately prior to procedure a time out was called to verify the correct patient, procedure, equipment, support staff and site/side marked as required. Patient was prepped and draped in the usual sterile fashion.      Clinical Data: No additional findings.  ROS:  All other systems negative, except as noted in the HPI. Review of Systems  Objective: Vital Signs: There were no vitals taken for this  visit.  Specialty Comments:  No specialty comments available.  PMFS History: Patient Active Problem List   Diagnosis Date Noted   Diabetic macular edema, left eye (HCC) 07/12/2015   Macular puckering of retina, bilateral 07/12/2015   Moderate nonproliferative diabetic retinopathy of both eyes without macular edema associated with type 2 diabetes mellitus (HCC) 07/12/2015   Senile nuclear sclerosis, bilateral 07/12/2015   Hypertensive retinopathy of both eyes 07/12/2015   Lumbar stenosis with neurogenic claudication 05/02/2015   Chronic low back pain 12/07/2014   Epiretinal membrane, left eye 10/20/2013   Myasthenia gravis without exacerbation (HCC) 05/19/2013   Carpal tunnel syndrome 05/13/2013   Vitamin B 12 deficiency 05/13/2013   Myasthenia gravis with exacerbation, adult form (HCC) 05/13/2013   Ptosis of eyelid 05/13/2013   Dysphonia 05/13/2013   Shortness of breath 05/13/2013   GERD (gastroesophageal reflux disease) 05/13/2013   Nonproliferative diabetic retinopathy (HCC) 09/18/2011   HEMORRHOIDS-INTERNAL 02/22/2009   RECTAL BLEEDING 02/22/2009   PERSONAL HX COLON CANCER 02/22/2009   ADENOCARCINOMA, COLON 02/21/2009   DM 02/21/2009   HYPERLIPIDEMIA 02/21/2009   HEMORRHOIDS 02/21/2009   DIVERTICULOSIS, COLON 02/21/2009   HYPERTENSION NEC 02/21/2009   Past Medical History:  Diagnosis Date   Anemia    Anxiety    Arthritis    Basal cell carcinoma    Carpal tunnel syndrome, bilateral    Chronic low back pain 12/07/2014   Colon cancer (HCC) 2006   Colon polyps    Diabetes (HCC)    Diverticulosis    DJD (degenerative joint disease)    GERD (gastroesophageal reflux disease)    Hemorrhoids    Hyperlipidemia    Hypertension    Morbid obesity (HCC)    Myasthenia gravis (HCC)    Myasthenia gravis without exacerbation (HCC) 05/19/2013   Vitamin B12 deficiency     Family History  Problem Relation Age of Onset   Heart disease Mother    Prostate cancer Maternal  Grandfather    Colon polyps Maternal Grandfather    Colon cancer Neg Hx     Past Surgical History:  Procedure Laterality Date   ANKLE ARTHROPLASTY Right 1969   BASAL CELL CARCINOMA EXCISION     face   CARPAL TUNNEL WITH CUBITAL TUNNEL Left 06/02/2013   Procedure: CARPAL TUNNEL WITH CUBITAL TUNNEL;  Surgeon: Franky JONELLE Curia, MD;  Location: [facility];  Service: Orthopedics;  Laterality: Left;   CARPAL TUNNEL WITH CUBITAL TUNNEL Right 07/14/2013   Procedure: RIGHT CARPAL TUNNEL AND CUBITAL TUNNEL RELEASE;  Surgeon: Franky JONELLE Curia, MD;  Location: [facility];  Service: Orthopedics;  Laterality: Right;  COLONOSCOPY     LUMBAR LAMINECTOMY/DECOMPRESSION MICRODISCECTOMY N/A 05/02/2015   Procedure: LUMBAR LAMINECTOMY/DECOMPRESSION MICRODISCECTOMY 2 LEVELS;  Surgeon: Lamar Peaches, MD;  Location: [facility];  Service: Neurosurgery;  Laterality: N/A;  L3 L4 L5 laminectomies   MOLE REMOVAL  09/2021   PATELLA FRACTURE SURGERY Left 2011   RIGHT COLECTOMY  2006   TONSILLECTOMY  1968   Social History   Occupational History   Occupation: Retired Building Control Surveyor: [employer]  Tobacco Use   Smoking status: Every Day    Types: Cigars   Smokeless tobacco: Never  Vaping Use   Vaping status: Never Used  Substance and Sexual Activity   Alcohol use: Yes    Comment: 6 pack a year   Drug use: No   Sexual activity: Not on file

## 2023-10-02 ENCOUNTER — Encounter: Payer: Self-pay | Admitting: Neurology

## 2023-10-09 ENCOUNTER — Ambulatory Visit
Admission: RE | Admit: 2023-10-09 | Discharge: 2023-10-09 | Disposition: A | Discharge status: Home / Self Care | Payer: Medicare Other | Source: Ambulatory Visit | Attending: Internal Medicine | Admitting: Internal Medicine

## 2023-10-09 DIAGNOSIS — F17210 Nicotine dependence, cigarettes, uncomplicated: Secondary | ICD-10-CM

## 2023-10-21 ENCOUNTER — Encounter: Payer: Self-pay | Admitting: Podiatry

## 2023-10-21 ENCOUNTER — Ambulatory Visit: Payer: Medicare Other | Admitting: Podiatry

## 2023-10-21 DIAGNOSIS — M79675 Pain in left toe(s): Secondary | ICD-10-CM

## 2023-10-21 DIAGNOSIS — E0843 Diabetes mellitus due to underlying condition with diabetic autonomic (poly)neuropathy: Secondary | ICD-10-CM | POA: Diagnosis not present

## 2023-10-21 DIAGNOSIS — B351 Tinea unguium: Secondary | ICD-10-CM | POA: Diagnosis not present

## 2023-10-21 DIAGNOSIS — M79674 Pain in right toe(s): Secondary | ICD-10-CM | POA: Diagnosis not present

## 2023-10-21 NOTE — Progress Notes (Signed)
This patient returns to my office for at risk foot care.  This patient requires this care by a professional since this patient will be at risk due to having diabetes. ?This patient is unable to cut nails himself since the patient cannot reach his nails.These nails are painful walking and wearing shoes.  This patient presents for at risk foot care today. ? ?General Appearance  Alert, conversant and in no acute stress. ? ?Vascular  Dorsalis pedis and posterior tibial  pulses are weakly  palpable  bilaterally.  Capillary return is within normal limits  bilaterally. Cold feet. bilaterally. ? ?Neurologic  Senn-Weinstein monofilament wire test within normal limits  bilaterally. Muscle power within normal limits bilaterally. ? ?Nails Thick disfigured discolored nails with subungual debris  from hallux to fifth toes bilaterally. No evidence of bacterial infection or drainage bilaterally. ? ?Orthopedic  No limitations of motion  feet .  No crepitus or effusions noted.  No bony pathology or digital deformities noted. ? ?Skin  normotropic skin with no porokeratosis noted bilaterally.  No signs of infections or ulcers noted.    ? ?Onychomycosis  Pain in right toes  Pain in left toes ? ?Consent was obtained for treatment procedures.   Mechanical debridement of nails 1-5  bilaterally performed with a nail nipper.  Filed with dremel without incident.  ? ? ?Return office visit    3 months                  Told patient to return for periodic foot care and evaluation due to potential at risk complications. ? ? ?Gardiner Barefoot DPM   ?

## 2023-10-28 ENCOUNTER — Ambulatory Visit: Payer: Medicare Other | Admitting: Neurology

## 2023-10-29 ENCOUNTER — Other Ambulatory Visit: Payer: Self-pay | Admitting: Internal Medicine

## 2023-10-29 DIAGNOSIS — E041 Nontoxic single thyroid nodule: Secondary | ICD-10-CM

## 2023-11-04 ENCOUNTER — Ambulatory Visit
Admission: RE | Admit: 2023-11-04 | Discharge: 2023-11-04 | Disposition: A | Discharge status: Home / Self Care | Payer: Medicare Other | Source: Ambulatory Visit | Attending: Internal Medicine | Admitting: Internal Medicine

## 2023-11-04 DIAGNOSIS — E041 Nontoxic single thyroid nodule: Secondary | ICD-10-CM

## 2023-11-05 ENCOUNTER — Encounter: Payer: Self-pay | Admitting: Neurology

## 2023-11-05 ENCOUNTER — Ambulatory Visit: Payer: Medicare Other | Admitting: Neurology

## 2023-11-05 VITALS — BP 139/80 | HR 103 | Ht 68.0 in | Wt 267.0 lb

## 2023-11-05 DIAGNOSIS — G7000 Myasthenia gravis without (acute) exacerbation: Secondary | ICD-10-CM

## 2023-11-05 MED ORDER — MYCOPHENOLATE MOFETIL 500 MG PO TABS
500.0000 mg | ORAL_TABLET | Freq: Two times a day (BID) | ORAL | 3 refills | Status: DC
  Filled 2024-08-28: qty 180, 90d supply, fill #0

## 2023-11-05 MED ORDER — PYRIDOSTIGMINE BROMIDE 60 MG PO TABS: ORAL_TABLET | ORAL | 0 refills | Status: DC

## 2023-11-05 NOTE — Progress Notes (Signed)
 Follow-up Visit   Date: 11/05/23   Joshua [name] MRN: [id number] DOB: Apr 05, 1946   Interim History: Joshua [name] is a 78 y.o. right-handed Caucasian male with insulin-dependent diabetes mellitus, hypertension, hyperlipidemia, adenocarcinoma of the colon s/p right hemicolectomy, and chronic low back pain returning to the clinic for follow-up of myasthenia gravis.  The patient was accompanied to the clinic by self.  History of present illness: He was diagnosed in 2012 after presenting with dysphagia, ptosis, and difficulty with chewing.  He did not have limb weakness or double vision. He was hospitalized and treated with IVIG after which, he was started on prednisone 40mg  and Cellcept. He was seeing Dr. Sandria Manly and then transferred care with Dr. Anne Hahn at [facility].  He was previously taking 1000mg  twice daily, which has been slowly tapered.   He has not been hospitalized since his diagnosis.  He is currently taking mestinon 30mg  twice daily Cellcept 500mg  twice daily.  He has mild right ptosis, no double vision, difficulty swallowing/talking, or limb weakness.  He has not had an exacerbation since 2012 with his initial diagnosis.  He does not know if he is antibody positive.    UPDATE 11/05/2023:  He is here for 1 year follow-up visit.  He has been stable without any MG symptoms.  He denies double vision, droopy eyelids, slurred speech, or limb weakness.  He is compliant with Cellcept 500mg  BID.  He has not needed to take mestinon.   Medications:  Current Outpatient Medications on File Prior to Visit  Medication Sig Dispense Refill   atorvastatin (LIPITOR) 40 MG tablet Take 40 mg by mouth every evening.     B-D ULTRAFINE III SHORT PEN 31G X 8 MM MISC 200 each by Other route 3 (three) times daily between meals.     busPIRone (BUSPAR) 7.5 MG tablet TAKE 1 TABLET BY MOUTH TWICE DAILY 180 tablet 1   Calcium Carb-Cholecalciferol (CALCIUM + D3 PO) Take 1 tablet by mouth daily before supper.      cetirizine (ZYRTEC) 10 MG tablet Take 10 mg by mouth daily as needed for allergies.     Cyanocobalamin (VITAMIN B-12) 2500 MCG SUBL Place 2,500 mcg under the tongue in the morning.     diclofenac Sodium (VOLTAREN) 1 % GEL Apply topically as needed.     Eyelid Cleansers (STERILID EX) Apply 1 application to eye daily as needed (eyelid inflammation).     FARXIGA 10 MG TABS tablet Take 10 mg by mouth in the morning.     fluticasone (FLONASE) 50 MCG/ACT nasal spray Place 1 spray into both nostrils 2 (two) times daily as needed for allergies.     gabapentin (NEURONTIN) 300 MG capsule Take 600 mg by mouth at bedtime.  3   HUMALOG MIX 75/25 KWIKPEN (75-25) 100 UNIT/ML KwikPen Inject 42 Units into the skin in the morning and at bedtime.     ketoconazole (NIZORAL) 2 % shampoo Apply 1 application topically once a week.  5   Lidocaine 4 % PTCH Place 1 patch onto the skin daily as needed (back pain.).     lisinopril (PRINIVIL,ZESTRIL) 5 MG tablet Take 5 mg by mouth in the morning.     metFORMIN (GLUCOPHAGE) 1000 MG tablet Take 1,000 mg by mouth at bedtime.     Multiple Vitamins-Minerals (OCUVITE PRESERVISION PO) Take 1 tablet by mouth in the morning.     ONE TOUCH ULTRA TEST test strip 200 each by Other route 3 (three) times daily.  ONETOUCH DELICA LANCETS 33G MISC 100 each by Other route 3 (three) times daily.     Polyethyl Glycol-Propyl Glycol (LUBRICANT EYE DROPS) 0.4-0.3 % SOLN Place 1 drop into both eyes 3 (three) times daily as needed (dry/irritated eyes.).     saccharomyces boulardii (FLORASTOR) 250 MG capsule Take 250 mg by mouth in the morning.     sulindac (CLINORIL) 150 MG tablet Take 150 mg by mouth daily as needed (pain/inflammation).     traMADol (ULTRAM) 50 MG tablet 2 Tablets Orally three times daily for 30 days     cyanocobalamin (,VITAMIN B-12,) 1000 MCG/ML injection Inject 1,000 mcg into the muscle as directed. Receives injection every other month (Patient not taking: Reported on  10/22/2022)     famotidine (PEPCID) 20 MG tablet Take 20 mg by mouth 2 (two) times daily.     No current facility-administered medications on file prior to visit.    Allergies:  Allergies  Allergen Reactions   Aluminum Chlorohydrate Rash   Niacin And Related Other (See Comments)    flushing    Vital Signs:  BP 139/80   Pulse (!) 103   Ht 5\' 8"  (1.727 m)   Wt 267 lb (121.1 kg)   SpO2 97%   BMI 40.60 kg/m    Neurological Exam: MENTAL STATUS including orientation to time, place, person, recent and remote memory, attention span and concentration, language, and fund of knowledge is normal.  Speech is not dysarthric.  CRANIAL NERVES:  No visual field defects.  Pupils equal round and reactive to light.  Normal conjugate, extra-ocular eye movements in all directions of gaze.  No ptosis.  Face is symmetric. Facial muscles are 5/5   MOTOR:  Motor strength is 5/5 in all extremities.  No atrophy, fasciculations or abnormal movements.  No pronator drift.  Tone is normal.    COORDINATION/GAIT:  Gait is wide-based, unassisted and stable.  Data:  Lab Results  Component Value Date   VITAMINB12 1,066 10/22/2022     IMPRESSION/PLAN: Myasthenia gravis without exacerbation, diagnosed 2012 with bulbar symptoms.  No evidence of disease manifestation or symptomology.  - Continue Cellcept 500mg  BID  - Okay to take Mestinon 30 mg daily as needed  - CBC and CMP checked by PCP in December, will request to review.   2.  Vitamin B12 deficiency  - Continue B12 daily  Return to clinic in 1 year  Thank you for allowing me to participate in patient's care.  If I can answer any additional questions, I would be pleased to do so.    Sincerely,    Drae Mitzel K. Allena Katz, DO

## 2023-12-31 NOTE — Progress Notes (Unsigned)
  Cardiology Office Note:  .   Date:  12/31/2023  ID:  Joshua [name] 26-Mar-1946, MRN [id number] PCP: Joshua Broad, MD  [facility] Providers Cardiologist:  None { Click to update primary MD,subspecialty MD or APP then REFRESH:1}   History of Present Illness: .   Joshua [name] is a 78 y.o. male ***  ROS: ***  Studies Reviewed: .        *** Risk Assessment/Calculations:   {Does this patient have ATRIAL FIBRILLATION?:[phone]} No BP recorded.  {Refresh Note OR Click here to enter BP  :1}***       Physical Exam:   VS:  There were no vitals taken for this visit.   Wt Readings from Last 3 Encounters:  11/05/23 267 lb (121.1 kg)  10/22/22 267 lb (121.1 kg)  10/20/21 267 lb (121.1 kg)    GEN: Well nourished, well developed in no acute distress NECK: No JVD; No carotid bruits CARDIAC: ***RRR, no murmurs, rubs, gallops RESPIRATORY:  Clear to auscultation without rales, wheezing or rhonchi  ABDOMEN: Soft, non-tender, non-distended EXTREMITIES:  No edema; No deformity   ASSESSMENT AND PLAN: .   ***    {Are you ordering a CV Procedure (e.g. stress test, cath, DCCV, TEE, etc)?   Press F2        :[id number]}  Dispo: ***  Signed, Ahmad Alert, MD

## 2024-01-01 ENCOUNTER — Encounter: Payer: Self-pay | Admitting: Cardiovascular Disease

## 2024-01-01 ENCOUNTER — Ambulatory Visit: Payer: Medicare Other | Attending: Cardiovascular Disease | Admitting: Cardiovascular Disease

## 2024-01-01 VITALS — BP 132/74 | HR 89 | Ht 68.0 in | Wt 264.2 lb

## 2024-01-01 DIAGNOSIS — Z7689 Persons encountering health services in other specified circumstances: Secondary | ICD-10-CM

## 2024-01-01 DIAGNOSIS — R011 Cardiac murmur, unspecified: Secondary | ICD-10-CM

## 2024-01-01 DIAGNOSIS — I10 Essential (primary) hypertension: Secondary | ICD-10-CM | POA: Diagnosis not present

## 2024-01-01 NOTE — Patient Instructions (Signed)
 Testing/Procedures: ECHO Your physician has requested that you have an echocardiogram. Echocardiography is a painless test that uses sound waves to create images of your heart. It provides your doctor with information about the size and shape of your heart and how well your heart's chambers and valves are working. This procedure takes approximately one hour. There are no restrictions for this procedure. Please do NOT wear cologne, perfume, aftershave, or lotions (deodorant is allowed). Please arrive 15 minutes prior to your appointment time.  Please note: We ask at that you not bring children with you during ultrasound (echo/ vascular) testing. Due to room size and safety concerns, children are not allowed in the ultrasound rooms during exams. Our front office staff cannot provide observation of children in our lobby area while testing is being conducted. An adult accompanying a patient to their appointment will only be allowed in the ultrasound room at the discretion of the ultrasound technician under special circumstances. We apologize for any inconvenience.  Follow-Up: At [facility], you and your health needs are our priority.  As part of our continuing mission to provide you with exceptional heart care, our providers are all part of one team.  This team includes your primary Cardiologist (physician) and Advanced Practice Providers or APPs (Physician Assistants and Nurse Practitioners) who all work together to provide you with the care you need, when you need it.  Your next appointment:   6 month(s)  Provider:   Ahmad Alert, MD   1st Floor: - Lobby - Registration  - Pharmacy  - Lab - Cafe  2nd Floor: - PV Lab - Diagnostic Testing (echo, CT, nuclear med)  3rd Floor: - Vacant  4th Floor: - TCTS (cardiothoracic surgery) - AFib Clinic - Structural Heart Clinic - Vascular Surgery  - Vascular Ultrasound  5th Floor: - HeartCare Cardiology (general and EP) - Clinical  Pharmacy for coumadin, hypertension, lipid, weight-loss medications, and med management appointments    Valet parking services will be available as well.

## 2024-01-20 ENCOUNTER — Encounter: Payer: Self-pay | Admitting: Podiatry

## 2024-01-20 ENCOUNTER — Ambulatory Visit: Payer: Medicare Other | Admitting: Podiatry

## 2024-01-20 DIAGNOSIS — M79674 Pain in right toe(s): Secondary | ICD-10-CM

## 2024-01-20 DIAGNOSIS — E0843 Diabetes mellitus due to underlying condition with diabetic autonomic (poly)neuropathy: Secondary | ICD-10-CM | POA: Diagnosis not present

## 2024-01-20 DIAGNOSIS — M79675 Pain in left toe(s): Secondary | ICD-10-CM

## 2024-01-20 DIAGNOSIS — B351 Tinea unguium: Secondary | ICD-10-CM | POA: Diagnosis not present

## 2024-01-20 NOTE — Progress Notes (Signed)
This patient returns to my office for at risk foot care.  This patient requires this care by a professional since this patient will be at risk due to having diabetes. ?This patient is unable to cut nails himself since the patient cannot reach his nails.These nails are painful walking and wearing shoes.  This patient presents for at risk foot care today. ? ?General Appearance  Alert, conversant and in no acute stress. ? ?Vascular  Dorsalis pedis and posterior tibial  pulses are weakly  palpable  bilaterally.  Capillary return is within normal limits  bilaterally. Cold feet. bilaterally. ? ?Neurologic  Senn-Weinstein monofilament wire test within normal limits  bilaterally. Muscle power within normal limits bilaterally. ? ?Nails Thick disfigured discolored nails with subungual debris  from hallux to fifth toes bilaterally. No evidence of bacterial infection or drainage bilaterally. ? ?Orthopedic  No limitations of motion  feet .  No crepitus or effusions noted.  No bony pathology or digital deformities noted. ? ?Skin  normotropic skin with no porokeratosis noted bilaterally.  No signs of infections or ulcers noted.    ? ?Onychomycosis  Pain in right toes  Pain in left toes ? ?Consent was obtained for treatment procedures.   Mechanical debridement of nails 1-5  bilaterally performed with a nail nipper.  Filed with dremel without incident.  ? ? ?Return office visit    3 months                  Told patient to return for periodic foot care and evaluation due to potential at risk complications. ? ? ?Gardiner Barefoot DPM   ?

## 2024-02-07 ENCOUNTER — Ambulatory Visit (HOSPITAL_COMMUNITY)
Admission: RE | Admit: 2024-02-07 | Discharge: 2024-02-07 | Disposition: A | Discharge status: Home / Self Care | Source: Ambulatory Visit | Attending: Cardiology | Admitting: Cardiology

## 2024-02-07 DIAGNOSIS — R011 Cardiac murmur, unspecified: Secondary | ICD-10-CM

## 2024-02-07 DIAGNOSIS — Z7689 Persons encountering health services in other specified circumstances: Secondary | ICD-10-CM | POA: Insufficient documentation

## 2024-02-07 DIAGNOSIS — I10 Essential (primary) hypertension: Secondary | ICD-10-CM

## 2024-02-07 LAB — ECHOCARDIOGRAM COMPLETE
AR max vel: 1.04 cm2
AV Area VTI: 1.02 cm2
AV Area mean vel: 1.06 cm2
AV Mean grad: 19 mmHg
AV Peak grad: 34.2 mmHg
Ao pk vel: 2.93 m/s
Area-P 1/2: 4.49 cm2
S' Lateral: 2.85 cm

## 2024-02-07 MED ORDER — PERFLUTREN LIPID MICROSPHERE
1.0000 mL | INTRAVENOUS | Status: AC | PRN
  Administered 2024-02-07: 3 mL via INTRAVENOUS

## 2024-02-10 ENCOUNTER — Ambulatory Visit: Payer: Self-pay | Admitting: Cardiovascular Disease

## 2024-03-30 ENCOUNTER — Other Ambulatory Visit: Payer: Self-pay

## 2024-03-30 MED ORDER — PYRIDOSTIGMINE BROMIDE 60 MG PO TABS: ORAL_TABLET | ORAL | 0 refills | Status: DC

## 2024-04-14 ENCOUNTER — Encounter: Payer: Self-pay | Admitting: Neurology

## 2024-04-22 ENCOUNTER — Encounter: Payer: Self-pay | Admitting: Podiatry

## 2024-04-22 ENCOUNTER — Ambulatory Visit: Admitting: Podiatry

## 2024-04-22 DIAGNOSIS — M79674 Pain in right toe(s): Secondary | ICD-10-CM | POA: Diagnosis not present

## 2024-04-22 DIAGNOSIS — B351 Tinea unguium: Secondary | ICD-10-CM

## 2024-04-22 DIAGNOSIS — M79675 Pain in left toe(s): Secondary | ICD-10-CM | POA: Diagnosis not present

## 2024-04-22 DIAGNOSIS — E0843 Diabetes mellitus due to underlying condition with diabetic autonomic (poly)neuropathy: Secondary | ICD-10-CM | POA: Diagnosis not present

## 2024-04-22 NOTE — Progress Notes (Addendum)
 This patient returns to my office for at risk foot care.  This patient requires this care by a professional since this patient will be at risk due to having diabetes  This patient is unable to cut nails himself since the patient cannot reach his nails.These nails are painful walking and wearing shoes.  This patient presents for at risk foot care today.  General Appearance  Alert, conversant and in no acute stress.  Vascular  Dorsalis pedis and posterior tibial  pulses are  weakly palpable  bilaterally.  Capillary return is within normal limits  bilaterally. Cold feet bilaterally.  Neurologic  Senn-Weinstein monofilament wire test within normal limits  bilaterally. Muscle power within normal limits bilaterally.  Nails Thick disfigured discolored nails with subungual debris  from hallux to fifth toes bilaterally. No evidence of bacterial infection or drainage bilaterally.  Orthopedic  No limitations of motion  feet .  No crepitus or effusions noted.  No bony pathology or digital deformities noted.  Skin  normotropic skin with no porokeratosis noted bilaterally.  No signs of infections or ulcers noted.     Onychomycosis  Pain in right toes  Pain in left toes  Consent was obtained for treatment procedures.   Mechanical debridement of nails 1-5  bilaterally performed with a nail nipper.  Filed with dremel without incident.    Return office visit   3 months                   Told patient to return for periodic foot care and evaluation due to potential at risk complications.   Cordella Bold DPM [facility]

## 2024-05-15 ENCOUNTER — Other Ambulatory Visit: Payer: Self-pay | Admitting: Neurology

## 2024-07-13 ENCOUNTER — Encounter: Payer: Self-pay | Admitting: Radiology

## 2024-07-16 ENCOUNTER — Telehealth: Payer: Self-pay | Admitting: Podiatry

## 2024-07-16 NOTE — Telephone Encounter (Signed)
 Patient is now rescheduled for 08/10/24

## 2024-07-23 ENCOUNTER — Ambulatory Visit: Admitting: Podiatry

## 2024-08-10 ENCOUNTER — Encounter: Payer: Self-pay | Admitting: Podiatry

## 2024-08-10 ENCOUNTER — Ambulatory Visit (INDEPENDENT_AMBULATORY_CARE_PROVIDER_SITE_OTHER): Admitting: Podiatry

## 2024-08-10 DIAGNOSIS — E0843 Diabetes mellitus due to underlying condition with diabetic autonomic (poly)neuropathy: Secondary | ICD-10-CM | POA: Diagnosis not present

## 2024-08-10 DIAGNOSIS — M79675 Pain in left toe(s): Secondary | ICD-10-CM | POA: Diagnosis not present

## 2024-08-10 DIAGNOSIS — M79674 Pain in right toe(s): Secondary | ICD-10-CM

## 2024-08-10 DIAGNOSIS — B351 Tinea unguium: Secondary | ICD-10-CM

## 2024-08-10 NOTE — Progress Notes (Signed)
 This patient returns to my office for at risk foot care.  This patient requires this care by a professional since this patient will be at risk due to having diabetes  This patient is unable to cut nails himself since the patient cannot reach his nails.These nails are painful walking and wearing shoes.  This patient presents for at risk foot care today.  General Appearance  Alert, conversant and in no acute stress.  Vascular  Dorsalis pedis and posterior tibial  pulses are  weakly palpable  bilaterally.  Capillary return is within normal limits  bilaterally. Cold feet bilaterally.  Neurologic  Senn-Weinstein monofilament wire test within normal limits  bilaterally. Muscle power within normal limits bilaterally.  Nails Thick disfigured discolored nails with subungual debris  from hallux to fifth toes bilaterally. No evidence of bacterial infection or drainage bilaterally.  Orthopedic  No limitations of motion  feet .  No crepitus or effusions noted.  No bony pathology or digital deformities noted.  Skin  normotropic skin with no porokeratosis noted bilaterally.  No signs of infections or ulcers noted.     Onychomycosis  Pain in right toes  Pain in left toes  Consent was obtained for treatment procedures.   Mechanical debridement of nails 1-5  bilaterally performed with a nail nipper.  Filed with dremel without incident.    Return office visit   3 months                   Told patient to return for periodic foot care and evaluation due to potential at risk complications.   Cordella Bold DPM [facility]

## 2024-08-21 ENCOUNTER — Other Ambulatory Visit (HOSPITAL_COMMUNITY): Payer: Self-pay

## 2024-08-25 ENCOUNTER — Other Ambulatory Visit (HOSPITAL_COMMUNITY): Payer: Self-pay

## 2024-08-25 ENCOUNTER — Other Ambulatory Visit: Payer: Self-pay

## 2024-08-25 MED ORDER — GABAPENTIN 300 MG PO CAPS
600.0000 mg | ORAL_CAPSULE | Freq: Every day | ORAL | 1 refills | Status: DC
  Filled 2024-08-25: qty 200, 100d supply, fill #0

## 2024-08-25 MED ORDER — METFORMIN HCL 1000 MG PO TABS
1000.0000 mg | ORAL_TABLET | Freq: Every day | ORAL | 3 refills | Status: DC
  Filled 2024-08-25: qty 60, 60d supply, fill #0

## 2024-08-26 ENCOUNTER — Other Ambulatory Visit (HOSPITAL_COMMUNITY): Payer: Self-pay

## 2024-08-28 ENCOUNTER — Other Ambulatory Visit (HOSPITAL_COMMUNITY): Payer: Self-pay

## 2024-08-28 ENCOUNTER — Other Ambulatory Visit: Payer: Self-pay

## 2024-08-28 MED ORDER — LISINOPRIL 5 MG PO TABS
5.0000 mg | ORAL_TABLET | Freq: Every day | ORAL | 3 refills | Status: AC
  Filled 2024-08-28: qty 90, 90d supply, fill #0

## 2024-08-28 MED ORDER — INSULIN LISPRO PROT & LISPRO (75-25 MIX) 100 UNIT/ML KWIKPEN
PEN_INJECTOR | SUBCUTANEOUS | 3 refills | Status: AC
  Filled 2024-08-28: qty 18, 21d supply, fill #0

## 2024-08-28 MED ORDER — GABAPENTIN 300 MG PO CAPS
600.0000 mg | ORAL_CAPSULE | Freq: Every day | ORAL | 1 refills | Status: DC
  Filled 2024-08-28: qty 200, 100d supply, fill #0

## 2024-08-28 MED ORDER — SULINDAC 150 MG PO TABS
150.0000 mg | ORAL_TABLET | Freq: Two times a day (BID) | ORAL | 2 refills | Status: AC | PRN
  Filled 2024-08-28: qty 200, 100d supply, fill #0

## 2024-08-28 MED ORDER — BUSPIRONE HCL 7.5 MG PO TABS
7.5000 mg | ORAL_TABLET | Freq: Two times a day (BID) | ORAL | 4 refills | Status: AC
  Filled 2024-08-28: qty 180, 90d supply, fill #0

## 2024-08-28 MED ORDER — ATORVASTATIN CALCIUM 40 MG PO TABS
40.0000 mg | ORAL_TABLET | Freq: Every day | ORAL | 3 refills | Status: DC
  Filled 2024-08-28: qty 90, 90d supply, fill #0

## 2024-08-28 MED ORDER — METFORMIN HCL 1000 MG PO TABS
1000.0000 mg | ORAL_TABLET | Freq: Every day | ORAL | 3 refills | Status: AC
  Filled 2024-08-28: qty 60, 60d supply, fill #0

## 2024-08-28 MED FILL — Pyridostigmine Bromide Tab 60 MG: ORAL | 33 days supply | Qty: 100 | Fill #0 | Status: AC

## 2024-08-31 ENCOUNTER — Other Ambulatory Visit: Payer: Self-pay

## 2024-08-31 ENCOUNTER — Other Ambulatory Visit (HOSPITAL_COMMUNITY): Payer: Self-pay

## 2024-09-01 ENCOUNTER — Other Ambulatory Visit (HOSPITAL_COMMUNITY): Payer: Self-pay

## 2024-09-04 ENCOUNTER — Other Ambulatory Visit (HOSPITAL_COMMUNITY): Payer: Self-pay

## 2024-10-05 ENCOUNTER — Telehealth: Admitting: Neurology

## 2024-10-07 ENCOUNTER — Other Ambulatory Visit: Payer: Self-pay | Admitting: Internal Medicine

## 2024-10-07 DIAGNOSIS — E041 Nontoxic single thyroid nodule: Secondary | ICD-10-CM

## 2024-10-13 ENCOUNTER — Other Ambulatory Visit: Payer: Self-pay

## 2024-10-13 ENCOUNTER — Ambulatory Visit: Admitting: Neurology

## 2024-10-13 ENCOUNTER — Encounter: Payer: Self-pay | Admitting: Neurology

## 2024-10-13 VITALS — BP 103/62 | HR 99 | Ht 68.0 in | Wt 261.0 lb

## 2024-10-13 DIAGNOSIS — G7000 Myasthenia gravis without (acute) exacerbation: Secondary | ICD-10-CM | POA: Diagnosis not present

## 2024-10-13 MED ORDER — MYCOPHENOLATE MOFETIL 500 MG PO TABS
500.0000 mg | ORAL_TABLET | Freq: Two times a day (BID) | ORAL | 3 refills | Status: AC
  Filled 2024-10-13: qty 180, 90d supply, fill #0

## 2024-10-13 NOTE — Patient Instructions (Signed)
 You may reduce Cellcept  500mg  daily x 1 month, then stop.  If you have any breakthrough symptoms, you may resume taking Cellcept .

## 2024-10-14 ENCOUNTER — Other Ambulatory Visit (HOSPITAL_COMMUNITY): Payer: Self-pay

## 2024-10-19 ENCOUNTER — Ambulatory Visit: Payer: Medicare Other | Admitting: Neurology

## 2024-11-09 ENCOUNTER — Ambulatory Visit: Admitting: Podiatry

## 2025-04-12 ENCOUNTER — Ambulatory Visit: Payer: Self-pay | Admitting: Neurology
